# Patient Record
Sex: Male | Born: 1955
Health system: Southern US, Community
[De-identification: ages and names within clinical notes are randomized; demographics above are authoritative.]

## PROBLEM LIST (undated history)

## (undated) DIAGNOSIS — G8929 Other chronic pain: Secondary | ICD-10-CM

## (undated) DIAGNOSIS — F4310 Post-traumatic stress disorder, unspecified: Secondary | ICD-10-CM

## (undated) DIAGNOSIS — Z87442 Personal history of urinary calculi: Secondary | ICD-10-CM

## (undated) DIAGNOSIS — F319 Bipolar disorder, unspecified: Secondary | ICD-10-CM

## (undated) DIAGNOSIS — I10 Essential (primary) hypertension: Secondary | ICD-10-CM

## (undated) DIAGNOSIS — W3400XA Accidental discharge from unspecified firearms or gun, initial encounter: Secondary | ICD-10-CM

## (undated) DIAGNOSIS — F410 Panic disorder [episodic paroxysmal anxiety] without agoraphobia: Secondary | ICD-10-CM

## (undated) DIAGNOSIS — I82621 Acute embolism and thrombosis of deep veins of right upper extremity: Secondary | ICD-10-CM

## (undated) DIAGNOSIS — M549 Dorsalgia, unspecified: Secondary | ICD-10-CM

## (undated) DIAGNOSIS — J181 Lobar pneumonia, unspecified organism: Secondary | ICD-10-CM

## (undated) DIAGNOSIS — N319 Neuromuscular dysfunction of bladder, unspecified: Secondary | ICD-10-CM

## (undated) DIAGNOSIS — S060X9A Concussion with loss of consciousness of unspecified duration, initial encounter: Secondary | ICD-10-CM

## (undated) DIAGNOSIS — J449 Chronic obstructive pulmonary disease, unspecified: Secondary | ICD-10-CM

## (undated) DIAGNOSIS — I4819 Other persistent atrial fibrillation: Secondary | ICD-10-CM

## (undated) DIAGNOSIS — T879 Unspecified complications of amputation stump: Secondary | ICD-10-CM

## (undated) DIAGNOSIS — S42309A Unspecified fracture of shaft of humerus, unspecified arm, initial encounter for closed fracture: Secondary | ICD-10-CM

## (undated) DIAGNOSIS — J9621 Acute and chronic respiratory failure with hypoxia: Secondary | ICD-10-CM

## (undated) DIAGNOSIS — I5021 Acute systolic (congestive) heart failure: Secondary | ICD-10-CM

## (undated) DIAGNOSIS — R51 Headache: Secondary | ICD-10-CM

## (undated) DIAGNOSIS — F209 Schizophrenia, unspecified: Secondary | ICD-10-CM

## (undated) DIAGNOSIS — I219 Acute myocardial infarction, unspecified: Secondary | ICD-10-CM

## (undated) DIAGNOSIS — F329 Major depressive disorder, single episode, unspecified: Secondary | ICD-10-CM

## (undated) DIAGNOSIS — Z931 Gastrostomy status: Secondary | ICD-10-CM

## (undated) DIAGNOSIS — F429 Obsessive-compulsive disorder, unspecified: Secondary | ICD-10-CM

## (undated) DIAGNOSIS — S069X9A Unspecified intracranial injury with loss of consciousness of unspecified duration, initial encounter: Secondary | ICD-10-CM

## (undated) DIAGNOSIS — K029 Dental caries, unspecified: Secondary | ICD-10-CM

## (undated) DIAGNOSIS — F32A Depression, unspecified: Secondary | ICD-10-CM

## (undated) DIAGNOSIS — E876 Hypokalemia: Secondary | ICD-10-CM

## (undated) DIAGNOSIS — S060XAA Concussion with loss of consciousness status unknown, initial encounter: Secondary | ICD-10-CM

## (undated) DIAGNOSIS — F4312 Post-traumatic stress disorder, chronic: Secondary | ICD-10-CM

## (undated) DIAGNOSIS — K759 Inflammatory liver disease, unspecified: Secondary | ICD-10-CM

## (undated) DIAGNOSIS — E119 Type 2 diabetes mellitus without complications: Secondary | ICD-10-CM

## (undated) DIAGNOSIS — M1990 Unspecified osteoarthritis, unspecified site: Secondary | ICD-10-CM

## (undated) DIAGNOSIS — F1110 Opioid abuse, uncomplicated: Secondary | ICD-10-CM

## (undated) DIAGNOSIS — G4700 Insomnia, unspecified: Secondary | ICD-10-CM

## (undated) DIAGNOSIS — I482 Chronic atrial fibrillation, unspecified: Secondary | ICD-10-CM

## (undated) DIAGNOSIS — I330 Acute and subacute infective endocarditis: Secondary | ICD-10-CM

## (undated) DIAGNOSIS — G546 Phantom limb syndrome with pain: Secondary | ICD-10-CM

## (undated) DIAGNOSIS — I4891 Unspecified atrial fibrillation: Secondary | ICD-10-CM

## (undated) HISTORY — DX: Post-traumatic stress disorder, chronic: F43.12

## (undated) HISTORY — DX: Acute and subacute infective endocarditis: I33.0

## (undated) HISTORY — DX: Acute embolism and thrombosis of deep veins of right upper extremity: I82.621

## (undated) HISTORY — DX: Hypokalemia: E87.6

## (undated) HISTORY — PX: ABOVE KNEE LEG AMPUTATION: SUR20

## (undated) HISTORY — DX: Phantom limb syndrome with pain: G54.6

## (undated) HISTORY — PX: COLONOSCOPY WITH ESOPHAGOGASTRODUODENOSCOPY (EGD): SHX5779

## (undated) HISTORY — DX: Other persistent atrial fibrillation: I48.19

## (undated) HISTORY — DX: Type 2 diabetes mellitus without complications: E11.9

## (undated) HISTORY — PX: APPENDECTOMY: SHX54

## (undated) HISTORY — DX: Neuromuscular dysfunction of bladder, unspecified: N31.9

## (undated) HISTORY — DX: Gastrostomy status: Z93.1

---
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
## 2009-05-10 ENCOUNTER — Emergency Department (HOSPITAL_COMMUNITY): Admission: EM | Admit: 2009-05-10 | Discharge: 2009-05-10 | Payer: Self-pay | Admitting: Emergency Medicine

## 2013-11-24 ENCOUNTER — Non-Acute Institutional Stay (SKILLED_NURSING_FACILITY): Payer: Medicare Other | Admitting: Internal Medicine

## 2013-11-24 DIAGNOSIS — I471 Supraventricular tachycardia, unspecified: Secondary | ICD-10-CM

## 2013-11-24 DIAGNOSIS — I480 Paroxysmal atrial fibrillation: Secondary | ICD-10-CM

## 2013-11-24 DIAGNOSIS — A0472 Enterocolitis due to Clostridium difficile, not specified as recurrent: Secondary | ICD-10-CM | POA: Insufficient documentation

## 2013-11-24 DIAGNOSIS — Z720 Tobacco use: Secondary | ICD-10-CM

## 2013-11-24 DIAGNOSIS — S78119A Complete traumatic amputation at level between unspecified hip and knee, initial encounter: Secondary | ICD-10-CM

## 2013-11-24 DIAGNOSIS — Z89612 Acquired absence of left leg above knee: Principal | ICD-10-CM

## 2013-11-24 DIAGNOSIS — I4891 Unspecified atrial fibrillation: Secondary | ICD-10-CM

## 2013-11-24 DIAGNOSIS — F17200 Nicotine dependence, unspecified, uncomplicated: Secondary | ICD-10-CM

## 2013-11-24 DIAGNOSIS — G40909 Epilepsy, unspecified, not intractable, without status epilepticus: Secondary | ICD-10-CM

## 2013-11-24 DIAGNOSIS — Z89611 Acquired absence of right leg above knee: Secondary | ICD-10-CM

## 2013-11-24 HISTORY — DX: Acquired absence of right leg above knee: Z89.611

## 2013-11-24 HISTORY — DX: Supraventricular tachycardia, unspecified: I47.10

## 2013-11-24 HISTORY — DX: Epilepsy, unspecified, not intractable, without status epilepticus: G40.909

## 2013-11-24 HISTORY — DX: Supraventricular tachycardia: I47.1

## 2013-11-24 HISTORY — DX: Tobacco use: Z72.0

## 2013-11-24 NOTE — Progress Notes (Signed)
Patient ID: [name] [name], male   DOB: 25-Jun-1955, 58 y.o.   MRN: [id number]  Facility; [facility] Chief complaint; admission to SNF post admit to [facility] Exact dates uncertain  History; the patient came to this facility last week from [facility]. Not completely certain of the status admission. However he appears to undergone bilateral above-knee amputations. The patient states that this occurred as a result of frostbite he suffered in 2013 with resultant gangrene. He tells me that he has been through a series of foot surgeries bilaterally ultimately ending up in this amputation. I have some more information on his postoperative course which included pseudomembranous colitis, A. fib with rapid ventricular response with heart rate in the 190s, paroxysmal atrial tachycardia. Surreptitious narcotic in the hospital requiring Narcan. The patient spent some time in the ICU. On arrival here there was complains about inadequate dose of Xanax as well as narcotics .  Patient is actually transferring to a skilled facility in [place] apparently on Thursday due to the fact that this is a nonsmoking facility.  Past medical history Atrial fibrillation Blood loss anemia Secondary thrombocytosis Hyperglycemia but no formal diagnosis of diabetes Hyperglobulinemia Protein calorie malnutrition Status post below-knee amputation on the left 10/06/12 Hyponatremia Schizophrenia Hypomagnesemia Hepatitis C Tobacco use disorder Chronic pain disorder Status post left below-knee F. the patient 10/06/12 Flexion contractures of both knees Seizure disorder "Neuromuscular disorder" Innumerable orthopedic procedures on his lower extremities from 07/05/2011 through 03/13/2013  Medications; Aspirin 325 twice daily Calcium citrate 200 mg daily Vitamin D 3000 units daily NuIron 150 mg daily Toprolol 12.5 twice a day Xanax 1 mg 3 times a day although the patient came  here only on Xanax 1 mg each bedtime when necessary I increase this when he first arrived into the facility at his insistence. In Neurontin 603 times daily MSIR 15 mg every 6 hours when necessary Topamax 200 3 times daily Vancomycin orally 125 mg every 6 hours for 6 days  Social; she was in a skilled facility prior to his admission to [place] he states this was in [place]. I have none of this information. He continues to wish to smoke and this is a nonsmoking facility. I did discuss this with him in some detail emphasizing it was probably the right time to quit as he has not smoked in over several weeks. [name] [name] is insistent and moving in 2 days.  Family history; positive for diabetes in both sides of his family although the patient states he has not formally a diabetic  Review of systems Respiratory no cough no sputum Cardiac no chest pain no palpitations. He denies any prior history of cardiac disease before the atrial fibrillation during the latest hospitalization GI states his bowel movements are now solid there is no abdominal pain  Physical examination; General patient is not in any distress appears to be stable. Vitals pulse 72 and completely regular Respiratory; clear entry bilaterally Cardiac heart sounds sound regular no murmurs no gallops no carotid bruits Abdomen no tenderness no masses no liver no spleen. Extremities bilateral above-knee amputations. Sites look very stable sutures still in he is due to see his surgeon tomorrow.  Impression/plan #1 status post above-knee amputations. The sites appear to be stable #2 postoperative atrial fibrillation and paroxysmal atrial tachycardia. Once again his rate is controlled seems to be regular. At one point he appears to been on xarelto in the hospital however he has been discharged on aspirin 325 twice  a day and that only for 6 weeks #3 pseudomembranous colitis this appears to have resolved #4 seizure disorder I am assuming this is  the reason for the Neurontin and Topamax #5 personal history of schizophrenia although he is on nothing for this. Mental status seems to be stable. He does have some pressure of speech but all of it seemed to be fairly rational.  The patient is leaving the facility in 2 days. He appears to be stable. I have had personal discussion with him about tobacco cessation, however he is not willing to consider this. He states that he would consider using electronic cigarettes however there is apparently a facility policy against this. Would've liked to have checked his lab work however I am able to see from 7/13 he had a normal potassium at 4.3 BUN and creatinine were normal. His hemoglobin was 12.1 as he is going to a new facility I don't see the point in checking this. If this falls through then I will re review him next week

## 2015-02-21 ENCOUNTER — Encounter (HOSPITAL_COMMUNITY): Payer: Self-pay

## 2015-02-21 ENCOUNTER — Emergency Department (HOSPITAL_COMMUNITY): Payer: Medicare Other

## 2015-02-21 ENCOUNTER — Observation Stay (HOSPITAL_COMMUNITY)
Admission: EM | Admit: 2015-02-21 | Discharge: 2015-02-23 | Disposition: A | Discharge status: Home / Self Care | Payer: Medicare Other | Attending: Internal Medicine | Admitting: Internal Medicine

## 2015-02-21 DIAGNOSIS — G92 Toxic encephalopathy: Principal | ICD-10-CM | POA: Insufficient documentation

## 2015-02-21 DIAGNOSIS — F209 Schizophrenia, unspecified: Secondary | ICD-10-CM

## 2015-02-21 DIAGNOSIS — R9431 Abnormal electrocardiogram [ECG] [EKG]: Secondary | ICD-10-CM

## 2015-02-21 DIAGNOSIS — Z590 Homelessness: Secondary | ICD-10-CM | POA: Insufficient documentation

## 2015-02-21 DIAGNOSIS — Z89611 Acquired absence of right leg above knee: Secondary | ICD-10-CM | POA: Diagnosis not present

## 2015-02-21 DIAGNOSIS — I252 Old myocardial infarction: Secondary | ICD-10-CM | POA: Diagnosis not present

## 2015-02-21 DIAGNOSIS — I4581 Long QT syndrome: Secondary | ICD-10-CM | POA: Diagnosis not present

## 2015-02-21 DIAGNOSIS — F4323 Adjustment disorder with mixed anxiety and depressed mood: Secondary | ICD-10-CM | POA: Diagnosis not present

## 2015-02-21 DIAGNOSIS — Z89612 Acquired absence of left leg above knee: Secondary | ICD-10-CM | POA: Insufficient documentation

## 2015-02-21 DIAGNOSIS — J9600 Acute respiratory failure, unspecified whether with hypoxia or hypercapnia: Secondary | ICD-10-CM | POA: Diagnosis not present

## 2015-02-21 DIAGNOSIS — T40604A Poisoning by unspecified narcotics, undetermined, initial encounter: Secondary | ICD-10-CM

## 2015-02-21 DIAGNOSIS — Z720 Tobacco use: Secondary | ICD-10-CM | POA: Diagnosis present

## 2015-02-21 DIAGNOSIS — T402X5A Adverse effect of other opioids, initial encounter: Secondary | ICD-10-CM | POA: Diagnosis not present

## 2015-02-21 DIAGNOSIS — G894 Chronic pain syndrome: Secondary | ICD-10-CM | POA: Diagnosis not present

## 2015-02-21 DIAGNOSIS — T40601A Poisoning by unspecified narcotics, accidental (unintentional), initial encounter: Secondary | ICD-10-CM | POA: Insufficient documentation

## 2015-02-21 DIAGNOSIS — G8929 Other chronic pain: Secondary | ICD-10-CM | POA: Diagnosis not present

## 2015-02-21 DIAGNOSIS — I10 Essential (primary) hypertension: Secondary | ICD-10-CM | POA: Diagnosis not present

## 2015-02-21 DIAGNOSIS — T402X1A Poisoning by other opioids, accidental (unintentional), initial encounter: Secondary | ICD-10-CM

## 2015-02-21 HISTORY — DX: Other chronic pain: G89.29

## 2015-02-21 HISTORY — DX: Acute myocardial infarction, unspecified: I21.9

## 2015-02-21 HISTORY — DX: Essential (primary) hypertension: I10

## 2015-02-21 HISTORY — DX: Unspecified complications of amputation stump: T87.9

## 2015-02-21 HISTORY — DX: Obsessive-compulsive disorder, unspecified: F42.9

## 2015-02-21 HISTORY — DX: Schizophrenia, unspecified: F20.9

## 2015-02-21 LAB — I-STAT CG4 LACTIC ACID, ED: Lactic Acid, Venous: 0.81 mmol/L (ref 0.5–2.0)

## 2015-02-21 LAB — CBC WITH DIFFERENTIAL/PLATELET
BASOS ABS: 0.1 10*3/uL (ref 0.0–0.1)
BASOS PCT: 1 %
Eosinophils Absolute: 0.3 10*3/uL (ref 0.0–0.7)
Eosinophils Relative: 4 %
HEMATOCRIT: 40.2 % (ref 39.0–52.0)
Hemoglobin: 13.1 g/dL (ref 13.0–17.0)
LYMPHS PCT: 37 %
Lymphs Abs: 2.8 10*3/uL (ref 0.7–4.0)
MCH: 30.1 pg (ref 26.0–34.0)
MCHC: 32.6 g/dL (ref 30.0–36.0)
MCV: 92.4 fL (ref 78.0–100.0)
MONO ABS: 0.9 10*3/uL (ref 0.1–1.0)
Monocytes Relative: 12 %
NEUTROS ABS: 3.5 10*3/uL (ref 1.7–7.7)
NEUTROS PCT: 46 %
Platelets: 208 10*3/uL (ref 150–400)
RBC: 4.35 MIL/uL (ref 4.22–5.81)
RDW: 13.7 % (ref 11.5–15.5)
WBC: 7.6 10*3/uL (ref 4.0–10.5)

## 2015-02-21 LAB — BLOOD GAS, ARTERIAL
Acid-Base Excess: 0.8 mmol/L (ref 0.0–2.0)
Bicarbonate: 25 mEq/L — ABNORMAL HIGH (ref 20.0–24.0)
Drawn by: 307971
O2 Content: 2 L/min
O2 SAT: 94.8 %
PATIENT TEMPERATURE: 98.6
PH ART: 7.408 (ref 7.350–7.450)
PO2 ART: 74.7 mmHg — AB (ref 80.0–100.0)
TCO2: 22.1 mmol/L (ref 0–100)
pCO2 arterial: 40.3 mmHg (ref 35.0–45.0)

## 2015-02-21 LAB — MRSA PCR SCREENING: MRSA by PCR: NEGATIVE

## 2015-02-21 LAB — COMPREHENSIVE METABOLIC PANEL
ALBUMIN: 3.2 g/dL — AB (ref 3.5–5.0)
ALK PHOS: 96 U/L (ref 38–126)
ALT: 24 U/L (ref 17–63)
ANION GAP: 5 (ref 5–15)
AST: 33 U/L (ref 15–41)
BILIRUBIN TOTAL: 0.7 mg/dL (ref 0.3–1.2)
BUN: 15 mg/dL (ref 6–20)
CALCIUM: 8.8 mg/dL — AB (ref 8.9–10.3)
CO2: 27 mmol/L (ref 22–32)
Chloride: 110 mmol/L (ref 101–111)
Creatinine, Ser: 0.65 mg/dL (ref 0.61–1.24)
GFR calc Af Amer: >60 mL/min (ref >60)
GFR calc non Af Amer: >60 mL/min (ref >60)
GLUCOSE: 100 mg/dL — AB (ref 65–99)
POTASSIUM: 3.8 mmol/L (ref 3.5–5.1)
SODIUM: 142 mmol/L (ref 135–145)
Total Protein: 6.3 g/dL — ABNORMAL LOW (ref 6.5–8.1)

## 2015-02-21 LAB — SALICYLATE LEVEL

## 2015-02-21 LAB — RAPID URINE DRUG SCREEN, HOSP PERFORMED
Amphetamines: NOT DETECTED
BENZODIAZEPINES: POSITIVE — AB
Barbiturates: POSITIVE — AB
COCAINE: NOT DETECTED
OPIATES: POSITIVE — AB
TETRAHYDROCANNABINOL: NOT DETECTED

## 2015-02-21 LAB — ACETAMINOPHEN LEVEL: Acetaminophen (Tylenol), Serum: <10 ug/mL — ABNORMAL LOW (ref 10–30)

## 2015-02-21 LAB — LIPASE, BLOOD: LIPASE: 16 U/L — AB (ref 22–51)

## 2015-02-21 LAB — MAGNESIUM: MAGNESIUM: 1.9 mg/dL (ref 1.7–2.4)

## 2015-02-21 LAB — ETHANOL: Alcohol, Ethyl (B): <5 mg/dL (ref <5)

## 2015-02-21 LAB — BRAIN NATRIURETIC PEPTIDE: B Natriuretic Peptide: 66.9 pg/mL (ref 0.0–100.0)

## 2015-02-21 MED ORDER — KETOROLAC TROMETHAMINE 30 MG/ML IJ SOLN
30.0000 mg | Freq: Three times a day (TID) | INTRAMUSCULAR | Status: DC | PRN
  Administered 2015-02-22 (×2): 30 mg via INTRAVENOUS
  Filled 2015-02-21 (×2): qty 1

## 2015-02-21 MED ORDER — SODIUM CHLORIDE 0.9 % IJ SOLN: 3.0000 mL | Freq: Two times a day (BID) | INTRAMUSCULAR | Status: DC

## 2015-02-21 MED ORDER — NALOXONE HCL 0.4 MG/ML IJ SOLN
0.4000 mg | Freq: Once | INTRAMUSCULAR | Status: AC
  Administered 2015-02-21: 0.4 mg via INTRAVENOUS
  Filled 2015-02-21: qty 1

## 2015-02-21 MED ORDER — ALBUTEROL SULFATE (2.5 MG/3ML) 0.083% IN NEBU
5.0000 mg | INHALATION_SOLUTION | Freq: Once | RESPIRATORY_TRACT | Status: AC
  Administered 2015-02-21: 5 mg via RESPIRATORY_TRACT
  Filled 2015-02-21: qty 6

## 2015-02-21 MED ORDER — SODIUM CHLORIDE 0.9 % IV SOLN
0.2500 mg/h | INTRAVENOUS | Status: DC
  Administered 2015-02-21: 0.25 mg/h via INTRAVENOUS
  Filled 2015-02-21: qty 4

## 2015-02-21 MED ORDER — ACETAMINOPHEN 650 MG RE SUPP: 650.0000 mg | Freq: Four times a day (QID) | RECTAL | Status: DC | PRN

## 2015-02-21 MED ORDER — ONDANSETRON HCL 4 MG PO TABS: 4.0000 mg | ORAL_TABLET | Freq: Four times a day (QID) | ORAL | Status: DC | PRN

## 2015-02-21 MED ORDER — NICOTINE 14 MG/24HR TD PT24
14.0000 mg | MEDICATED_PATCH | Freq: Every day | TRANSDERMAL | Status: DC
  Administered 2015-02-23: 14 mg via TRANSDERMAL
  Filled 2015-02-21 (×3): qty 1

## 2015-02-21 MED ORDER — ENOXAPARIN SODIUM 40 MG/0.4ML ~~LOC~~ SOLN
40.0000 mg | SUBCUTANEOUS | Status: DC
  Administered 2015-02-21 – 2015-02-22 (×2): 40 mg via SUBCUTANEOUS
  Filled 2015-02-21 (×3): qty 0.4

## 2015-02-21 MED ORDER — ONDANSETRON HCL 4 MG/2ML IJ SOLN: 4.0000 mg | Freq: Four times a day (QID) | INTRAMUSCULAR | Status: DC | PRN

## 2015-02-21 MED ORDER — SODIUM CHLORIDE 0.9 % IV BOLUS (SEPSIS)
500.0000 mL | Freq: Once | INTRAVENOUS | Status: AC
  Administered 2015-02-21: 500 mL via INTRAVENOUS

## 2015-02-21 MED ORDER — ACETAMINOPHEN 325 MG PO TABS: 650.0000 mg | ORAL_TABLET | Freq: Four times a day (QID) | ORAL | Status: DC | PRN

## 2015-02-21 MED ORDER — SODIUM CHLORIDE 0.9 % IV SOLN
INTRAVENOUS | Status: DC
  Administered 2015-02-21: 20:00:00 via INTRAVENOUS

## 2015-02-21 NOTE — Progress Notes (Addendum)
Pt unable to provide name of pcp States he is transitioning to another provider Pt address listed in EPIC as [address] [place] Pt not able to stay awake long enough for CM to speak with him EMS brought pt w/c with blue, white and tan checkered bag on back of it

## 2015-02-21 NOTE — ED Provider Notes (Signed)
CSN: [id number]     Arrival date & time 02/21/15  1418 History   First MD Initiated Contact with Patient 02/21/15 1455     Chief Complaint  Patient presents with  . Drug Overdose     (Consider location/radiation/quality/duration/timing/severity/associated sxs/prior Treatment) HPI  59 year old male with a history of schizophrenia and bilateral AKA with chronic pain presents from his psychiatrist's office with concern for an opiate overdose. Patient was lethargic, breathing around 4 times per minute at the office. Woke up appropriately with 0.4 mg narcan. Shortly after arrival but before I evaluated patient he became lethargic again and respiratory rate decreased. Given another 0.4 mg narcan and improved. Patient states he's been tired for weeks and this is a recurrent issue. Does not know why he is having trouble sleeping. Takes Morphine and oxycodone. Denies any extra doses. Denies any other medication use. No SI/HI. No intentional overdose. Complains of a cough for "weeks" without dyspnea or chest pain.   Past Medical History  Diagnosis Date  . OCD (obsessive compulsive disorder)   . Schizophrenia (HCC)   . MI (myocardial infarction) (HCC)   . HTN (hypertension)   . AKA stump complication (HCC)   . Chronic pain    Past Surgical History  Procedure Laterality Date  . Above knee leg amputation Bilateral    History reviewed. No pertinent family history. Social History  Substance Use Topics  . Smoking status: Current Every Day Smoker -- 0.50 packs/day    Types: Cigarettes  . Smokeless tobacco: Never Used  . Alcohol Use: No    Review of Systems  Constitutional: Positive for fatigue. Negative for fever.  Respiratory: Positive for cough. Negative for shortness of breath.   Cardiovascular: Negative for chest pain.  Gastrointestinal: Negative for vomiting.  Psychiatric/Behavioral: Positive for sleep disturbance. Negative for suicidal ideas.  All other systems reviewed and are  negative.     Allergies  Review of patient's allergies indicates no known allergies.  Home Medications   Prior to Admission medications   Not on File   BP 106/67 mmHg  Pulse 57  Temp(Src) 98.8 F (37.1 C) (Oral)  Resp 10  SpO2 89% Physical Exam  Constitutional: He is oriented to person, place, and time. He appears well-developed and well-nourished.  HENT:  Head: Normocephalic and atraumatic.  Right Ear: External ear normal.  Left Ear: External ear normal.  Nose: Nose normal.  Eyes: EOM are normal. Pupils are equal, round, and reactive to light. Right eye exhibits no discharge. Left eye exhibits no discharge.  Neck: Neck supple.  Cardiovascular: Normal rate, regular rhythm, normal heart sounds and intact distal pulses.   Pulmonary/Chest: Effort normal. He has wheezes (faint bilateral wheezes).  Abdominal: Soft. He exhibits no distension. There is no tenderness.  Musculoskeletal: He exhibits no edema.  Bilateral AKA  Neurological: He is alert and oriented to person, place, and time.  Alert and oriented appropriately. Normal strength in all 4 extremities  Skin: Skin is warm and dry.  Nursing note and vitals reviewed.   ED Course  Procedures (including critical care time) Labs Review Labs Reviewed  ACETAMINOPHEN LEVEL - Abnormal; Notable for the following:    Acetaminophen (Tylenol), Serum <10 (*)    All other components within normal limits  COMPREHENSIVE METABOLIC PANEL - Abnormal; Notable for the following:    Glucose, Bld 100 (*)    Calcium 8.8 (*)    Total Protein 6.3 (*)    Albumin 3.2 (*)    All other components  within normal limits  LIPASE, BLOOD - Abnormal; Notable for the following:    Lipase 16 (*)    All other components within normal limits  URINE RAPID DRUG SCREEN, HOSP PERFORMED - Abnormal; Notable for the following:    Opiates POSITIVE (*)    Benzodiazepines POSITIVE (*)    Barbiturates POSITIVE (*)    All other components within normal limits   BLOOD GAS, ARTERIAL - Abnormal; Notable for the following:    pO2, Arterial 74.7 (*)    Bicarbonate 25.0 (*)    All other components within normal limits  MRSA PCR SCREENING  ETHANOL  SALICYLATE LEVEL  CBC WITH DIFFERENTIAL/PLATELET  BRAIN NATRIURETIC PEPTIDE  MAGNESIUM  I-STAT CG4 LACTIC ACID, ED    Imaging Review Dg Chest Port 1 View  02/21/2015  CLINICAL DATA:  Cough lethargy EXAM: PORTABLE CHEST 1 VIEW COMPARISON:  None currently available FINDINGS: Low lung volumes with diffuse interstitial coarsening. Borderline cardiomegaly. Negative aortic and hilar contours. There is a bandlike opacity in the right mid lung favoring atelectasis. IMPRESSION: 1. Diffuse interstitial coarsening which could be bronchitic or congestive. 2. Atelectasis along the right minor fissure. Electronically Signed   By: Marnee Spring M.D.   On: 02/21/2015 15:55   I have personally reviewed and evaluated these images and lab results as part of my medical decision-making.   EKG Interpretation   Date/Time:  Monday February 21 2015 14:38:20 EDT Ventricular Rate:  71 PR Interval:  147 QRS Duration: 86 QT Interval:  461 QTC Calculation: 501 R Axis:   79 Text Interpretation:  Sinus rhythm Prolonged QT interval prolonged QT, no  previous available for comparison Confirmed by LITTLE MD, RACHEL [phone])  on 02/21/2015 2:53:35 PM      MDM   Final diagnoses:  Narcotic overdose, undetermined intent, initial encounter    Patient awakens easily to stimulation but keeps falling asleep and having decreased respirations under 10 respirations per minute. Has been given 2 doses of Narcan already. Will need to be placed on a Narcan drip and need to be observed in the hospital given his long-acting morphine is likely contributing. No other focal findings. Admit to the hospitalist in the step down unit.     Pricilla Loveless, MD 02/21/15 [phone]

## 2015-02-21 NOTE — H&P (Signed)
[facility] History and Physical  [name] [name] WUJ:[id number] DOB: [birth year] DOA: 02/21/2015  Referring physician: Dr Merril Abbe PCP: No primary care provider on file.   Chief Complaint: slow breathing  HPI: [name] [name] is a 59 y.o. male  Level V caveat: Patient presenting an altered mental state and unable to give reliable history. History obtained by ED report with limited additional information by patient.  Patient presented to his psychiatrist's office today for routine follow-up when he was noted to have a respiratory rate of 4 breaths per minute and acting very lethargic and sleepy. Patient will wake up briefly when stabilized by staff but would very quickly returned to his somnolent state. In the ED patient was given 0.4 mg of Narcan and awoke very rapidly, however this only lasted a few minutes and patient needed an additional dose of Narcan nor to get his respiratory rate back up to a normal state.  Currently patient denies taking any additional doses of his morphine and oxycodone outside of that which is prescribed. See separate no recent changes to his medications. Denies any other symptoms such as chest pain, shortness of breath, fevers. Continues to state "I'm just trying to get my life right."  Review of Systems:  Unable to obtain reliable review of history due to pts mental state  Past Medical History  Diagnosis Date  . OCD (obsessive compulsive disorder)   . Schizophrenia (HCC)   . MI (myocardial infarction) (HCC)   . HTN (hypertension)   . AKA stump complication (HCC)   . Chronic pain    Past Surgical History  Procedure Laterality Date  . Above knee leg amputation Bilateral    Social History:  reports that he has been smoking Cigarettes.  He has been smoking about 0.50 packs per day. He has never used smokeless tobacco. He reports that he does not drink alcohol or use illicit drugs.  No Known Allergies  Family History  Problem Relation Age of  Onset  . Hypertension Mother   . Hypertension Father   . Diabetes Mother   . Diabetes Father      Prior to Admission medications   Medication Sig Start Date End Date Taking? Authorizing Provider  ALPRAZolam Prudy Feeler) 1 MG tablet Take 1 mg by mouth 4 (four) times daily as needed for anxiety.   Yes Historical Provider, MD  morphine (MSIR) 30 MG tablet Take 30 mg by mouth 2 (two) times daily.   Yes Historical Provider, MD  oxycodone (ROXICODONE) 30 MG immediate release tablet Take 30 mg by mouth 2 (two) times daily as needed (for breakthrough pain).    Yes Historical Provider, MD   Physical Exam: Filed Vitals:   02/21/15 1730 02/21/15 1808 02/21/15 1815 02/21/15 1830  BP: 105/88 98/71 114/84 109/72  Pulse: 72 64 55 64  Temp:      TempSrc:      Resp: SpO2: 95% 91% 92% 97%    Wt Readings from Last 3 Encounters:  No data found for Wt    General: Somnolent. Arousable Eyes: Conjunctival injection, EOMI, pupils sluggish but reactive and symmetric ENT: Dry mucous membranes Neck:  no LAD, masses or thyromegaly Cardiovascular:  RRR, no m/r/g.  Respiratory: Patient with witnessed apneic episodes up to approximately 16-20 seconds. Respiratory rate approximately 4-5 breaths per minute. Unlabored. Clear to auscultation bilaterally. Abdomen:  soft, ntnd Skin:  no rash or induration seen on limited exam Musculoskeletal: Bilateral AKA. Upper extremity is normal.  Psychiatric:  Patient unable to answer questions appropriately when asked about his symptoms or medication dosing. Patient very tangential in his thought process. Patient denies SI/HI. Patient continues to state he is trying to get his life right. Neurologic:  CN 2-12 grossly intact, moves all extremities in coordinated fashion.          Labs on Admission:  Basic Metabolic Panel:  Recent Labs Lab 02/21/15 1529  NA 142  K 3.8  CL 110  CO2 27  GLUCOSE 100*  BUN 15  CREATININE 0.65  CALCIUM 8.8*   Liver Function  Tests:  Recent Labs Lab 02/21/15 1529  AST 33  ALT 24  ALKPHOS 96  BILITOT 0.7  PROT 6.3*  ALBUMIN 3.2*    Recent Labs Lab 02/21/15 1529  LIPASE 16*   No results for input(s): AMMONIA in the last 168 hours. CBC:  Recent Labs Lab 02/21/15 1529  WBC 7.6  NEUTROABS 3.5  HGB 13.1  HCT 40.2  MCV 92.4  PLT 208   Cardiac Enzymes: No results for input(s): CKTOTAL, CKMB, CKMBINDEX, TROPONINI in the last 168 hours.  BNP (last 3 results)  Recent Labs  02/21/15 1530  BNP 66.9    ProBNP (last 3 results) No results for input(s): PROBNP in the last 8760 hours.  CBG: No results for input(s): GLUCAP in the last 168 hours.  Radiological Exams on Admission: Dg Chest Port 1 View  02/21/2015  CLINICAL DATA:  Cough lethargy EXAM: PORTABLE CHEST 1 VIEW COMPARISON:  None currently available FINDINGS: Low lung volumes with diffuse interstitial coarsening. Borderline cardiomegaly. Negative aortic and hilar contours. There is a bandlike opacity in the right mid lung favoring atelectasis. IMPRESSION: 1. Diffuse interstitial coarsening which could be bronchitic or congestive. 2. Atelectasis along the right minor fissure. Electronically Signed   By: Marnee Spring M.D.   On: 02/21/2015 15:55    Assessment/Plan Active Problems:   Tobacco abuse   Narcotic overdose   Acute respiratory failure (HCC)   Opioid overdose   Schizophrenia (HCC)   Chronic pain   Prolonged Q-T interval on ECG   Acute respiratory failure: Likely secondary to opioid overdose. No evidence of infectious etiology. Lactic acid 0.81, WBC 7.6, afebrile, hemodynamically stable, BNP 66.9. CXR without acute process noted. - Stepdown - Narcan drip - ABG - O2 - f/u UA (bladder scan)  Opioid overdose: Patient currently denies intentional overdose and states there is no change in his daily regimen of MS Contin 30 mg twice a day and oxycodone 30 mg twice a day. Patient was seen at his psychiatrist's office just prior  to arrival in the ED where he was noted to be very somnolent. Salicylate level normal, Tylenol level normal, alcohol level normal. - Bedside sitter - Psychiatry consult - UDS  Psych: Admission for opiate overdose as above. Patient with baseline history of schizophrenia and OCD. In ED states he just really sleepy because he hasn't slept for the past 2-3 days.  - Psych consult as above  Prolonged QT: No previous EKG to compare. No known psychotropic drugs or other medications (Rx or OTC) that could cause long QT. Electrolytes nml - Tele - Mag level  Chronic pain: Patient status post bilateral AKA with long history of narcotic use for chronic MSK pain. - Hold home morphine and oxycodone due to problem #1 and 2. - toradol IV PRN pain relief.   Tobacco use:1/2ppd - Nicotine patch   Code Status: FULL - assumed  DVT Prophylaxis: Lovenox Family Communication: none  Disposition Plan:  Pending Improvement    MERRELL, DAVID Shela Commons, MD Family Medicine [facility] [email or web address] Password [email or web address]

## 2015-02-21 NOTE — ED Notes (Signed)
Per EMS- Patient was at his counselor's office and was lethargic and falling asleep while talking. Patient was given Narcan 0.4 mg IV prior to arrival tot he ED. Patient did awaken and was verbally abusive to EMS-

## 2015-02-21 NOTE — ED Notes (Signed)
Bed: IE33 Expected date:  Expected time:  Means of arrival:  Comments: EMS- 59yo M, lethargic/opiate OD

## 2015-02-22 DIAGNOSIS — T40604D Poisoning by unspecified narcotics, undetermined, subsequent encounter: Secondary | ICD-10-CM | POA: Diagnosis not present

## 2015-02-22 DIAGNOSIS — J9600 Acute respiratory failure, unspecified whether with hypoxia or hypercapnia: Secondary | ICD-10-CM | POA: Diagnosis not present

## 2015-02-22 DIAGNOSIS — T40602A Poisoning by unspecified narcotics, intentional self-harm, initial encounter: Secondary | ICD-10-CM | POA: Diagnosis not present

## 2015-02-22 DIAGNOSIS — T1491 Suicide attempt: Secondary | ICD-10-CM | POA: Diagnosis not present

## 2015-02-22 DIAGNOSIS — F4323 Adjustment disorder with mixed anxiety and depressed mood: Secondary | ICD-10-CM

## 2015-02-22 DIAGNOSIS — G92 Toxic encephalopathy: Secondary | ICD-10-CM | POA: Diagnosis not present

## 2015-02-22 HISTORY — DX: Adjustment disorder with mixed anxiety and depressed mood: F43.23

## 2015-02-22 MED ORDER — HYDROXYZINE HCL 50 MG PO TABS
50.0000 mg | ORAL_TABLET | Freq: Every day | ORAL | Status: DC
  Administered 2015-02-22: 50 mg via ORAL
  Filled 2015-02-22 (×2): qty 1

## 2015-02-22 MED ORDER — ALPRAZOLAM 1 MG PO TABS
1.0000 mg | ORAL_TABLET | Freq: Three times a day (TID) | ORAL | Status: DC | PRN
  Administered 2015-02-22 – 2015-02-23 (×2): 1 mg via ORAL
  Filled 2015-02-22 (×2): qty 1

## 2015-02-22 MED ORDER — MIRTAZAPINE 15 MG PO TBDP
15.0000 mg | ORAL_TABLET | Freq: Every day | ORAL | Status: DC
  Administered 2015-02-22: 15 mg via ORAL
  Filled 2015-02-22 (×3): qty 1

## 2015-02-22 MED ORDER — MORPHINE SULFATE ER 30 MG PO TBCR
30.0000 mg | EXTENDED_RELEASE_TABLET | Freq: Two times a day (BID) | ORAL | Status: DC
  Administered 2015-02-22 – 2015-02-23 (×2): 30 mg via ORAL
  Filled 2015-02-22 (×2): qty 1

## 2015-02-22 NOTE — Consult Note (Signed)
[name] [name] Psychiatry Consult   Reason for Consult:  drug overdose Referring Physician:  Dr. Karleen Hampshire Patient Identification: [name] [name] MRN:  [id number] Principal Diagnosis: Narcotic overdose Diagnosis:   Patient Active Problem List   Diagnosis Date Noted  . Narcotic overdose [T40.601A] 02/21/2015  . Acute respiratory failure ([place]) [J96.00] 02/21/2015  . Opioid overdose [T40.2X1A] 02/21/2015  . Schizophrenia ([place]) [F20.9] 02/21/2015  . Chronic pain [G89.29] 02/21/2015  . Prolonged Q-T interval on ECG [I45.81] 02/21/2015  . S/P bilateral above knee amputation ([place]) [T01.601, Z89.612] 11/24/2013  . Atrial fibrillation ([place]) [I48.91] 11/24/2013  . Paroxysmal supraventricular tachycardia ([place]) [I47.1] 11/24/2013  . Pseudomembranous colitis [A04.7] 11/24/2013  . Seizure disorder ([place]) [U93.235] 11/24/2013  . Tobacco abuse [Z72.0] 11/24/2013    Total Time spent with patient: 1 hour  Subjective:   [name] [name] is a 59 y.o. male patient admitted with somatic depression and insomnia.  HPI:  [name] 59 year old male seen face-to-face for psychiatric consultation and evaluation of overdose of opiates. Patient is living in a motel, and has been taking his medication from a psychiatrist and seeing a counselor in [place]. Reportedly patient was at counselor's office with the opiate overdose, drowsy and sleepy not able to respond with the counselor which is a concern he was sent to the hospital. Patient was admitted to psychiatric hospital when he was younger 59 years old but denies current suicidal or homicidal ideation. Patient repeatedly denied intentional overdose of opiates. Patient reported he is currently waiting for the placement. Patient has bilateral AKA and with a history of schizophrenia.  Patient has been awake, alert, oriented to time place person but has a poor memory about his medications and provides. Patient consider himself is a homeless and looking for  placement with the help of family members but could not give any details about any family members.  Patient denies current suicidal/homicidal ideation, intention or plans. Patient does not appear to be responding to internal stimuli. Patient stated that he has artificial limbs which was kept in his brother's home. Patient could not give me details about his brother for collateral information.  Past Psychiatric History: Patient has been diagnosed with schizophrenia and opioid abuse versus dependence and has received outpatient medication management from [facility].  Risk to Self: Is patient at risk for suicide?: No Risk to Others:   Prior Inpatient Therapy:   Prior Outpatient Therapy:    Past Medical History:  Past Medical History  Diagnosis Date  . OCD (obsessive compulsive disorder)   . Schizophrenia ([place])   . MI (myocardial infarction) ([place])   . HTN (hypertension)   . AKA stump complication ([place])   . Chronic pain     Past Surgical History  Procedure Laterality Date  . Above knee leg amputation Bilateral    Family History:  Family History  Problem Relation Age of Onset  . Hypertension Mother   . Hypertension Father   . Diabetes Mother   . Diabetes Father    Family Psychiatric  History: Unknown Social History:  History  Alcohol Use No     History  Drug Use No    Social History   Social History  . Marital Status: Single    Spouse Name: N/A  . Number of Children: N/A  . Years of Education: N/A   Social History Main Topics  . Smoking status: Current Every Day Smoker -- 0.50 packs/day    Types: Cigarettes  . Smokeless tobacco: Never Used  . Alcohol Use: No  .  Drug Use: No  . Sexual Activity: Not Asked   Other Topics Concern  . None   Social History Narrative   Additional Social History: Patient consider himself is a homeless, disabled and a poor historian.                          Allergies:  No Known Allergies  Labs:  Results for orders placed  or performed during the hospital encounter of 02/21/15 (from the past 48 hour(s))  Acetaminophen level     Status: Abnormal   Collection Time: 02/21/15  3:29 PM  Result Value Ref Range   Acetaminophen (Tylenol), Serum <10 (L) 10 - 30 ug/mL    Comment:        THERAPEUTIC CONCENTRATIONS VARY SIGNIFICANTLY. A RANGE OF 10-30 ug/mL MAY BE AN EFFECTIVE CONCENTRATION FOR MANY PATIENTS. HOWEVER, SOME ARE BEST TREATED AT CONCENTRATIONS OUTSIDE THIS RANGE. ACETAMINOPHEN CONCENTRATIONS >150 ug/mL AT 4 HOURS AFTER INGESTION AND >50 ug/mL AT 12 HOURS AFTER INGESTION ARE OFTEN ASSOCIATED WITH TOXIC REACTIONS.   Comprehensive metabolic panel     Status: Abnormal   Collection Time: 02/21/15  3:29 PM  Result Value Ref Range   Sodium 142 135 - 145 mmol/L   Potassium 3.8 3.5 - 5.1 mmol/L   Chloride 110 101 - 111 mmol/L   CO2 27 22 - 32 mmol/L   Glucose, Bld 100 (H) 65 - 99 mg/dL   BUN 15 6 - 20 mg/dL   Creatinine, Ser 0.65 0.61 - 1.24 mg/dL   Calcium 8.8 (L) 8.9 - 10.3 mg/dL   Total Protein 6.3 (L) 6.5 - 8.1 g/dL   Albumin 3.2 (L) 3.5 - 5.0 g/dL   AST 33 15 - 41 U/L   ALT 24 17 - 63 U/L   Alkaline Phosphatase 96 38 - 126 U/L   Total Bilirubin 0.7 0.3 - 1.2 mg/dL   GFR calc non Af Amer >60 >60 mL/min   GFR calc Af Amer >60 >60 mL/min    Comment: (NOTE) The eGFR has been calculated using the CKD EPI equation. This calculation has not been validated in all clinical situations. eGFR's persistently <60 mL/min signify possible Chronic Kidney Disease.    Anion gap 5 5 - 15  Ethanol     Status: None   Collection Time: 02/21/15  3:29 PM  Result Value Ref Range   Alcohol, Ethyl (B) <5 <5 mg/dL    Comment:        LOWEST DETECTABLE LIMIT FOR SERUM ALCOHOL IS 5 mg/dL FOR MEDICAL PURPOSES ONLY   Lipase, blood     Status: Abnormal   Collection Time: 02/21/15  3:29 PM  Result Value Ref Range   Lipase 16 (L) 22 - 51 U/L  Salicylate level     Status: None   Collection Time: 02/21/15  3:29 PM   Result Value Ref Range   Salicylate Lvl <9.0 2.8 - 30.0 mg/dL  CBC with Differential     Status: None   Collection Time: 02/21/15  3:29 PM  Result Value Ref Range   WBC 7.6 4.0 - 10.5 K/uL   RBC 4.35 4.22 - 5.81 MIL/uL   Hemoglobin 13.1 13.0 - 17.0 g/dL   HCT 40.2 39.0 - 52.0 %   MCV 92.4 78.0 - 100.0 fL   MCH 30.1 26.0 - 34.0 pg   MCHC 32.6 30.0 - 36.0 g/dL   RDW 13.7 11.5 - 15.5 %   Platelets 208 150 -  400 K/uL   Neutrophils Relative % 46 %   Neutro Abs 3.5 1.7 - 7.7 K/uL   Lymphocytes Relative 37 %   Lymphs Abs 2.8 0.7 - 4.0 K/uL   Monocytes Relative 12 %   Monocytes Absolute 0.9 0.1 - 1.0 K/uL   Eosinophils Relative 4 %   Eosinophils Absolute 0.3 0.0 - 0.7 K/uL   Basophils Relative 1 %   Basophils Absolute 0.1 0.0 - 0.1 K/uL  Magnesium     Status: None   Collection Time: 02/21/15  3:29 PM  Result Value Ref Range   Magnesium 1.9 1.7 - 2.4 mg/dL  Brain natriuretic peptide     Status: None   Collection Time: 02/21/15  3:30 PM  Result Value Ref Range   B Natriuretic Peptide 66.9 0.0 - 100.0 pg/mL  I-Stat CG4 Lactic Acid, ED     Status: None   Collection Time: 02/21/15  3:39 PM  Result Value Ref Range   Lactic Acid, Venous 0.81 0.5 - 2.0 mmol/L  Blood gas, arterial     Status: Abnormal   Collection Time: 02/21/15  6:45 PM  Result Value Ref Range   O2 Content 2.0 L/min   Delivery systems NASAL CANNULA    pH, Arterial 7.408 7.350 - 7.450   pCO2 arterial 40.3 35.0 - 45.0 mmHg   pO2, Arterial 74.7 (L) 80.0 - 100.0 mmHg   Bicarbonate 25.0 (H) 20.0 - 24.0 mEq/L   TCO2 22.1 0 - 100 mmol/L   Acid-Base Excess 0.8 0.0 - 2.0 mmol/L   O2 Saturation 94.8 %   Patient temperature 98.6    Collection site BRACHIAL ARTERY    Drawn by [id number]    Sample type ARTERIAL DRAW   MRSA PCR Screening     Status: None   Collection Time: 02/21/15  8:26 PM  Result Value Ref Range   MRSA by PCR NEGATIVE NEGATIVE    Comment:        The GeneXpert MRSA Assay (FDA approved for NASAL  specimens only), is one component of a comprehensive MRSA colonization surveillance program. It is not intended to diagnose MRSA infection nor to guide or monitor treatment for MRSA infections.   Urine rapid drug screen (hosp performed)     Status: Abnormal   Collection Time: 02/21/15 10:43 PM  Result Value Ref Range   Opiates POSITIVE (A) NONE DETECTED   Cocaine NONE DETECTED NONE DETECTED   Benzodiazepines POSITIVE (A) NONE DETECTED   Amphetamines NONE DETECTED NONE DETECTED   Tetrahydrocannabinol NONE DETECTED NONE DETECTED   Barbiturates POSITIVE (A) NONE DETECTED    Comment:        DRUG SCREEN FOR MEDICAL PURPOSES ONLY.  IF CONFIRMATION IS NEEDED FOR ANY PURPOSE, NOTIFY LAB WITHIN 5 DAYS.        LOWEST DETECTABLE LIMITS FOR URINE DRUG SCREEN Drug Class       Cutoff (ng/mL) Amphetamine      1000 Barbiturate      200 Benzodiazepine   786 Tricyclics       754 Opiates          300 Cocaine          300 THC              50     Current Facility-Administered Medications  Medication Dose Route Frequency Provider Last Rate Last Dose  . 0.9 %  sodium chloride infusion   Intravenous Continuous Waldemar Dickens, MD   Stopped at 02/22/15 [phone]  .  acetaminophen (TYLENOL) tablet 650 mg  650 mg Oral Q6H PRN Waldemar Dickens, MD       Or  . acetaminophen (TYLENOL) suppository 650 mg  650 mg Rectal Q6H PRN Waldemar Dickens, MD      . enoxaparin (LOVENOX) injection 40 mg  40 mg Subcutaneous Q24H Waldemar Dickens, MD   40 mg at 02/21/15 2107  . ketorolac (TORADOL) 30 MG/ML injection 30 mg  30 mg Intravenous Q8H PRN Waldemar Dickens, MD      . naloxone [facility]) 4 mg in sodium chloride 0.9 % 250 mL infusion  0.25 mg/hr Intravenous Continuous Waldemar Dickens, MD   Stopped at 02/22/15 [phone]  . nicotine (NICODERM CQ - dosed in mg/24 hours) patch 14 mg  14 mg Transdermal Daily Waldemar Dickens, MD   14 mg at 02/21/15 1915  . ondansetron (ZOFRAN) tablet 4 mg  4 mg Oral Q6H PRN Waldemar Dickens, MD        Or  . ondansetron [facility]) injection 4 mg  4 mg Intravenous Q6H PRN Waldemar Dickens, MD      . sodium chloride 0.9 % injection 3 mL  3 mL Intravenous Q12H Waldemar Dickens, MD   3 mL at 02/21/15 2200    Musculoskeletal: Strength & Muscle Tone: within normal limits Gait & Station: Bilateral AKA Patient leans: N/A  Psychiatric Specialty Exam: ROS patient denies nausea or vomiting, abdominal pain, shortness of breath and chest pain No Fever-chills, No Headache, No changes with Vision or hearing, reports vertigo No problems swallowing food or Liquids, No Chest pain, Cough or Shortness of Breath, No Abdominal pain, No Nausea or Vommitting, Bowel movements are regular, No Blood in stool or Urine, No dysuria, No new skin rashes or bruises, No new joints pains-aches,  No new weakness, tingling, numbness in any extremity, No recent weight gain or loss, No polyuria, polydypsia or polyphagia,  A full 10 point Review of Systems was done, except as stated above, all other Review of Systems were negative.  Blood pressure 132/87, pulse 62, temperature 98.6 F (37 C), temperature source Oral, resp. rate 18, height 6' 1"  (1.854 m), weight 80.4 kg (177 lb 4 oz), SpO2 92 %.Body mass index is 23.39 kg/(m^2).  General Appearance: Bizarre and Guarded  Eye Contact::  Good  Speech:  Pressured  Volume:  Increased  Mood:  Euphoric  Affect:  Congruent and Labile  Thought Process:  Irrelevant and Tangential  Orientation:  Full (Time, Place, and Person)  Thought Content:  WDL  Suicidal Thoughts:  No  Homicidal Thoughts:  No  Memory:  Immediate;   Fair Recent;   Fair  Judgement:  Fair  Insight:  Fair  Psychomotor Activity:  Restlessness  Concentration:  Fair  Recall:  [employer] of Knowledge:Good  Language: Good  Akathisia:  Negative  Handed:  Right  AIMS (if indicated):     Assets:  Communication Skills Desire for Improvement Financial Resources/Insurance Leisure Time Resilience Social  Support  ADL's:  Impaired  Cognition: Impaired,  Mild  Sleep:      Treatment Plan Summary: Daily contact with patient to assess and evaluate symptoms and progress in treatment and Medication management  Patient was not able to provide information about psychiatric medications at this time except he takes alprazolam and another medication. Patient denied current suicidal ideation contract for safety Will ask psychiatric social service to obtain collateral history from his primary care physician's and psychiatric practice in [place] May contact patient Brother who has been resourceful and helpful to the patient  Disposition: Patient does not meet criteria for psychiatric inpatient admission. Supportive therapy provided about ongoing stressors.  Kyrin Gratz,JANARDHAHA R. 02/22/2015 11:23 AM

## 2015-02-22 NOTE — Progress Notes (Signed)
Pt is admitted to 5E18 from [facility] ICU. Admission vital is stable. Pt is AOX4

## 2015-02-22 NOTE — Progress Notes (Signed)
[facility] PROGRESS NOTE  [name] [name] ZOX:[id number] DOB: [birth year] DOA: 02/21/2015 PCP: No primary care provider on file. Brief history: 59 year old male admitted for altered mental status , acute respiratory failure and lethargy.   Assessment/Plan: Acute encephalopathy: Possibly from opiod overdose, as per the patient unintentional. He responded well to narcan and he is alert and oriented.  Resolved.    Acute respiratory failure:  Possibly secondary to the above. And is resolved.  He is on RA with good oxygen sats.    Opioid Overdose: Did not restart his oxy 30 mg bid, . Psychiatry consulted too.   Chronic pain syndrome: Pain control    Code Status: full code.  Family Communication: none at bedside.  Disposition Plan: pending PT eval.    Consultants:  Psychiatry  Physical therapy.   Procedures:  none  Antibiotics:  none  HPI/Subjective: Wants to work with PT, requesting to start his zanax.   Objective: Filed Vitals:   02/22/15 1803  BP: 123/76  Pulse: 60  Temp: 97.8 F (36.6 C)  Resp: 18    Intake/Output Summary (Last 24 hours) at 02/22/15 1956 Last data filed at 02/22/15 1800  Gross per 24 hour  Intake 1562.75 ml  Output   1250 ml  Net 312.75 ml   Filed Weights   02/21/15 2015  Weight: 80.4 kg (177 lb 4 oz)    Exam:   General:  Alert afebrile comfortable,   Cardiovascular: s1s2  Respiratory: clear, no wheezing or rhonchi  Abdomen: soft non tender non distended bowel sounds heard.   Musculoskeletal: bilateral BKA.   Data Reviewed: Basic Metabolic Panel:  Recent Labs Lab 02/21/15 1529  NA 142  K 3.8  CL 110  CO2 27  GLUCOSE 100*  BUN 15  CREATININE 0.65  CALCIUM 8.8*  MG 1.9   Liver Function Tests:  Recent Labs Lab 02/21/15 1529  AST 33  ALT 24  ALKPHOS 96  BILITOT 0.7  PROT 6.3*  ALBUMIN 3.2*    Recent Labs Lab 02/21/15 1529  LIPASE 16*   No results for input(s): AMMONIA in the last 168  hours. CBC:  Recent Labs Lab 02/21/15 1529  WBC 7.6  NEUTROABS 3.5  HGB 13.1  HCT 40.2  MCV 92.4  PLT 208   Cardiac Enzymes: No results for input(s): CKTOTAL, CKMB, CKMBINDEX, TROPONINI in the last 168 hours. BNP (last 3 results)  Recent Labs  02/21/15 1530  BNP 66.9    ProBNP (last 3 results) No results for input(s): PROBNP in the last 8760 hours.  CBG: No results for input(s): GLUCAP in the last 168 hours.  Recent Results (from the past 240 hour(s))  MRSA PCR Screening     Status: None   Collection Time: 02/21/15  8:26 PM  Result Value Ref Range Status   MRSA by PCR NEGATIVE NEGATIVE Final    Comment:        The GeneXpert MRSA Assay (FDA approved for NASAL specimens only), is one component of a comprehensive MRSA colonization surveillance program. It is not intended to diagnose MRSA infection nor to guide or monitor treatment for MRSA infections.      Studies: Dg Chest Port 1 View  02/21/2015  CLINICAL DATA:  Cough lethargy EXAM: PORTABLE CHEST 1 VIEW COMPARISON:  None currently available FINDINGS: Low lung volumes with diffuse interstitial coarsening. Borderline cardiomegaly. Negative aortic and hilar contours. There is a bandlike opacity in the right mid lung favoring atelectasis. IMPRESSION: 1. Diffuse interstitial coarsening which  could be bronchitic or congestive. 2. Atelectasis along the right minor fissure. Electronically Signed   By: Marnee Spring M.D.   On: 02/21/2015 15:55    Scheduled Meds: . enoxaparin (LOVENOX) injection  40 mg Subcutaneous Q24H  . hydrOXYzine  50 mg Oral QHS  . mirtazapine  15 mg Oral QHS  . morphine  30 mg Oral Q12H  . nicotine  14 mg Transdermal Daily  . sodium chloride  3 mL Intravenous Q12H   Continuous Infusions: . sodium chloride Stopped (02/22/15 0552)    Principal Problem:   Narcotic overdose Active Problems:   Tobacco abuse   Acute respiratory failure (H[facility])   Opioid overdose   Schizophrenia ([facility])    Chronic pain   Prolonged Q-T interval on ECG   Adjustment disorder with mixed anxiety and depressed mood    Time spent: 25 min    [name] [name]  [facility] Pager [phone] If 7PM-7AM, please contact night-coverage at [email or web address], password [facility] 02/22/2015, 7:56 PM

## 2015-02-22 NOTE — Progress Notes (Signed)
Pt accidentally pulled IV out when turning over in bed. Narcan gtt and NS paused. IV site clean, dry, and intact. Catheter intact with removal. Pt alert and oriented. Pt refused to let RN try to reinsert IV states he "doesn't need sodium water or other medicines" he's been getting. During night pt repeatedly said he was leaving in the morning after talking to the doctors. Tama Gander, [facility] NP was notified. Will continue to monitor patient vitals and mentation.

## 2015-02-23 DIAGNOSIS — F4323 Adjustment disorder with mixed anxiety and depressed mood: Secondary | ICD-10-CM | POA: Diagnosis not present

## 2015-02-23 DIAGNOSIS — G92 Toxic encephalopathy: Secondary | ICD-10-CM | POA: Diagnosis not present

## 2015-02-23 DIAGNOSIS — T40602A Poisoning by unspecified narcotics, intentional self-harm, initial encounter: Secondary | ICD-10-CM | POA: Diagnosis not present

## 2015-02-23 DIAGNOSIS — T402X1D Poisoning by other opioids, accidental (unintentional), subsequent encounter: Secondary | ICD-10-CM

## 2015-02-23 DIAGNOSIS — J9601 Acute respiratory failure with hypoxia: Secondary | ICD-10-CM

## 2015-02-23 DIAGNOSIS — T1491 Suicide attempt: Secondary | ICD-10-CM | POA: Diagnosis not present

## 2015-02-23 DIAGNOSIS — F209 Schizophrenia, unspecified: Secondary | ICD-10-CM | POA: Diagnosis not present

## 2015-02-23 MED ORDER — MORPHINE SULFATE 30 MG PO TABS: 30.0000 mg | ORAL_TABLET | Freq: Two times a day (BID) | ORAL | Status: DC

## 2015-02-23 MED ORDER — MIRTAZAPINE 15 MG PO TBDP: 15.0000 mg | ORAL_TABLET | Freq: Every day | ORAL | Status: DC

## 2015-02-23 MED ORDER — MORPHINE SULFATE ER 30 MG PO TBCR: 30.0000 mg | EXTENDED_RELEASE_TABLET | Freq: Two times a day (BID) | ORAL | Status: DC

## 2015-02-23 MED ORDER — ACETAMINOPHEN 325 MG PO TABS: 650.0000 mg | ORAL_TABLET | Freq: Four times a day (QID) | ORAL | Status: DC | PRN

## 2015-02-23 MED ORDER — ALPRAZOLAM 1 MG PO TABS: 1.0000 mg | ORAL_TABLET | Freq: Two times a day (BID) | ORAL | Status: DC | PRN

## 2015-02-23 NOTE — Consult Note (Signed)
[place] Psychiatry Consult   Reason for Consult:  drug overdose Referring Physician:  Dr. Karleen Hampshire Patient Identification: [name] [name] MRN:  [id number] Principal Diagnosis: Narcotic overdose Diagnosis:   Patient Active Problem List   Diagnosis Date Noted  . Adjustment disorder with mixed anxiety and depressed mood [F43.23] 02/22/2015  . Narcotic overdose [T40.601A] 02/21/2015  . Acute respiratory failure ([place]) [J96.00] 02/21/2015  . Opioid overdose [T40.2X1A] 02/21/2015  . Schizophrenia ([place]) [F20.9] 02/21/2015  . Chronic pain [G89.29] 02/21/2015  . Prolonged Q-T interval on ECG [I45.81] 02/21/2015  . S/P bilateral above knee amputation ([place]) [Z60.109, Z89.612] 11/24/2013  . Atrial fibrillation ([place]) [I48.91] 11/24/2013  . Paroxysmal supraventricular tachycardia ([place]) [I47.1] 11/24/2013  . Pseudomembranous colitis [A04.7] 11/24/2013  . Seizure disorder ([place]) [N23.557] 11/24/2013  . Tobacco abuse [Z72.0] 11/24/2013    Total Time spent with patient: 30 minutes  Subjective:   [name] [name] is a 59 y.o. male patient admitted with somatic depression and insomnia.  HPI:  [name] [name] 59 year old male seen face-to-face for psychiatric consultation and evaluation of overdose of opiates. Patient is living in a motel, and has been taking his medication from a psychiatrist and seeing a counselor in [place]. Reportedly patient was at counselor's office with the opiate overdose, drowsy and sleepy not able to respond with the counselor which is a concern he was sent to the hospital. Patient was admitted to psychiatric hospital when he was younger 59 years old but denies current suicidal or homicidal ideation. Patient repeatedly denied intentional overdose of opiates. Patient reported he is currently waiting for the placement. Patient has bilateral AKA and with a history of schizophrenia. Patient has been awake, alert, oriented to time place person but has a poor memory about his  medications and provides. Patient consider himself is a homeless and looking for placement with the help of family members but could not give any details about any family members.  Patient denies current suicidal/homicidal ideation, intention or plans. Patient does not appear to be responding to internal stimuli. Patient stated that he has artificial limbs which was kept in his brother's home. Patient could not give me details about his brother for collateral information.  Past Psychiatric History: Patient has been diagnosed with schizophrenia and opioid abuse versus dependence and has received outpatient medication management from [facility].  Interval history: Patient seen today for psychiatric consultation follow-up. Patient has been transferring himself from the bed to the wheelchair with the help of physical therapist. Patient was found in the hallway rolling with wheelchair and talking with the staff members and also talking about basketball. Patient denies current symptoms of depression, anxiety, suicidal/homicidal ideation, intention or plans. Patient is hypertalkative and has no evidence of auditory or visual hallucinations, delusions or paranoia. Patient stated I'm not seeking drugs and have been taking medication as prescribed by the physicians.  Risk to Self: Is patient at risk for suicide?: No Risk to Others:   Prior Inpatient Therapy:   Prior Outpatient Therapy:    Past Medical History:  Past Medical History  Diagnosis Date  . OCD (obsessive compulsive disorder)   . Schizophrenia ([place])   . MI (myocardial infarction) ([place])   . HTN (hypertension)   . AKA stump complication ([place])   . Chronic pain     Past Surgical History  Procedure Laterality Date  . Above knee leg amputation Bilateral    Family History:  Family History  Problem Relation Age of Onset  . Hypertension Mother   . Hypertension  Father   . Diabetes Mother   . Diabetes Father    Family Psychiatric  History:  Unknown Social History:  History  Alcohol Use No     History  Drug Use No    Social History   Social History  . Marital Status: Single    Spouse Name: N/A  . Number of Children: N/A  . Years of Education: N/A   Social History Main Topics  . Smoking status: Current Every Day Smoker -- 0.50 packs/day    Types: Cigarettes  . Smokeless tobacco: Never Used  . Alcohol Use: No  . Drug Use: No  . Sexual Activity: Not Asked   Other Topics Concern  . None   Social History Narrative   Additional Social History: Patient consider himself is a homeless, disabled and a poor historian.                          Allergies:  No Known Allergies  Labs:  Results for orders placed or performed during the hospital encounter of 02/21/15 (from the past 48 hour(s))  Acetaminophen level     Status: Abnormal   Collection Time: 02/21/15  3:29 PM  Result Value Ref Range   Acetaminophen (Tylenol), Serum <10 (L) 10 - 30 ug/mL    Comment:        THERAPEUTIC CONCENTRATIONS VARY SIGNIFICANTLY. A RANGE OF 10-30 ug/mL MAY BE AN EFFECTIVE CONCENTRATION FOR MANY PATIENTS. HOWEVER, SOME ARE BEST TREATED AT CONCENTRATIONS OUTSIDE THIS RANGE. ACETAMINOPHEN CONCENTRATIONS >150 ug/mL AT 4 HOURS AFTER INGESTION AND >50 ug/mL AT 12 HOURS AFTER INGESTION ARE OFTEN ASSOCIATED WITH TOXIC REACTIONS.   Comprehensive metabolic panel     Status: Abnormal   Collection Time: 02/21/15  3:29 PM  Result Value Ref Range   Sodium 142 135 - 145 mmol/L   Potassium 3.8 3.5 - 5.1 mmol/L   Chloride 110 101 - 111 mmol/L   CO2 27 22 - 32 mmol/L   Glucose, Bld 100 (H) 65 - 99 mg/dL   BUN 15 6 - 20 mg/dL   Creatinine, Ser 0.65 0.61 - 1.24 mg/dL   Calcium 8.8 (L) 8.9 - 10.3 mg/dL   Total Protein 6.3 (L) 6.5 - 8.1 g/dL   Albumin 3.2 (L) 3.5 - 5.0 g/dL   AST 33 15 - 41 U/L   ALT 24 17 - 63 U/L   Alkaline Phosphatase 96 38 - 126 U/L   Total Bilirubin 0.7 0.3 - 1.2 mg/dL   GFR calc non Af Amer >60 >60 mL/min    GFR calc Af Amer >60 >60 mL/min    Comment: (NOTE) The eGFR has been calculated using the CKD EPI equation. This calculation has not been validated in all clinical situations. eGFR's persistently <60 mL/min signify possible Chronic Kidney Disease.    Anion gap 5 5 - 15  Ethanol     Status: None   Collection Time: 02/21/15  3:29 PM  Result Value Ref Range   Alcohol, Ethyl (B) <5 <5 mg/dL    Comment:        LOWEST DETECTABLE LIMIT FOR SERUM ALCOHOL IS 5 mg/dL FOR MEDICAL PURPOSES ONLY   Lipase, blood     Status: Abnormal   Collection Time: 02/21/15  3:29 PM  Result Value Ref Range   Lipase 16 (L) 22 - 51 U/L  Salicylate level     Status: None   Collection Time: 02/21/15  3:29 PM  Result Value Ref  Range   Salicylate Lvl <5.1 2.8 - 30.0 mg/dL  CBC with Differential     Status: None   Collection Time: 02/21/15  3:29 PM  Result Value Ref Range   WBC 7.6 4.0 - 10.5 K/uL   RBC 4.35 4.22 - 5.81 MIL/uL   Hemoglobin 13.1 13.0 - 17.0 g/dL   HCT 40.2 39.0 - 52.0 %   MCV 92.4 78.0 - 100.0 fL   MCH 30.1 26.0 - 34.0 pg   MCHC 32.6 30.0 - 36.0 g/dL   RDW 13.7 11.5 - 15.5 %   Platelets 208 150 - 400 K/uL   Neutrophils Relative % 46 %   Neutro Abs 3.5 1.7 - 7.7 K/uL   Lymphocytes Relative 37 %   Lymphs Abs 2.8 0.7 - 4.0 K/uL   Monocytes Relative 12 %   Monocytes Absolute 0.9 0.1 - 1.0 K/uL   Eosinophils Relative 4 %   Eosinophils Absolute 0.3 0.0 - 0.7 K/uL   Basophils Relative 1 %   Basophils Absolute 0.1 0.0 - 0.1 K/uL  Magnesium     Status: None   Collection Time: 02/21/15  3:29 PM  Result Value Ref Range   Magnesium 1.9 1.7 - 2.4 mg/dL  Brain natriuretic peptide     Status: None   Collection Time: 02/21/15  3:30 PM  Result Value Ref Range   B Natriuretic Peptide 66.9 0.0 - 100.0 pg/mL  I-Stat CG4 Lactic Acid, ED     Status: None   Collection Time: 02/21/15  3:39 PM  Result Value Ref Range   Lactic Acid, Venous 0.81 0.5 - 2.0 mmol/L  Blood gas, arterial     Status:  Abnormal   Collection Time: 02/21/15  6:45 PM  Result Value Ref Range   O2 Content 2.0 L/min   Delivery systems NASAL CANNULA    pH, Arterial 7.408 7.350 - 7.450   pCO2 arterial 40.3 35.0 - 45.0 mmHg   pO2, Arterial 74.7 (L) 80.0 - 100.0 mmHg   Bicarbonate 25.0 (H) 20.0 - 24.0 mEq/L   TCO2 22.1 0 - 100 mmol/L   Acid-Base Excess 0.8 0.0 - 2.0 mmol/L   O2 Saturation 94.8 %   Patient temperature 98.6    Collection site BRACHIAL ARTERY    Drawn by [id number]    Sample type ARTERIAL DRAW   MRSA PCR Screening     Status: None   Collection Time: 02/21/15  8:26 PM  Result Value Ref Range   MRSA by PCR NEGATIVE NEGATIVE    Comment:        The GeneXpert MRSA Assay (FDA approved for NASAL specimens only), is one component of a comprehensive MRSA colonization surveillance program. It is not intended to diagnose MRSA infection nor to guide or monitor treatment for MRSA infections.   Urine rapid drug screen (hosp performed)     Status: Abnormal   Collection Time: 02/21/15 10:43 PM  Result Value Ref Range   Opiates POSITIVE (A) NONE DETECTED   Cocaine NONE DETECTED NONE DETECTED   Benzodiazepines POSITIVE (A) NONE DETECTED   Amphetamines NONE DETECTED NONE DETECTED   Tetrahydrocannabinol NONE DETECTED NONE DETECTED   Barbiturates POSITIVE (A) NONE DETECTED    Comment:        DRUG SCREEN FOR MEDICAL PURPOSES ONLY.  IF CONFIRMATION IS NEEDED FOR ANY PURPOSE, NOTIFY LAB WITHIN 5 DAYS.        LOWEST DETECTABLE LIMITS FOR URINE DRUG SCREEN Drug Class       Cutoff (ng/mL)  Amphetamine      1000 Barbiturate      200 Benzodiazepine   250 Tricyclics       539 Opiates          300 Cocaine          300 THC              50     Current Facility-Administered Medications  Medication Dose Route Frequency Provider Last Rate Last Dose  . acetaminophen (TYLENOL) tablet 650 mg  650 mg Oral Q6H PRN Waldemar Dickens, MD       Or  . acetaminophen (TYLENOL) suppository 650 mg  650 mg Rectal Q6H PRN  Waldemar Dickens, MD      . ALPRAZolam Duanne Moron) tablet 1 mg  1 mg Oral TID PRN Hosie Poisson, MD   1 mg at 02/23/15 1128  . enoxaparin (LOVENOX) injection 40 mg  40 mg Subcutaneous Q24H Waldemar Dickens, MD   40 mg at 02/22/15 2209  . hydrOXYzine (ATARAX/VISTARIL) tablet 50 mg  50 mg Oral QHS Ambrose Finland, MD   50 mg at 02/22/15 2206  . ketorolac (TORADOL) 30 MG/ML injection 30 mg  30 mg Intravenous Q8H PRN Waldemar Dickens, MD   30 mg at 02/22/15 2209  . mirtazapine (REMERON SOL-TAB) disintegrating tablet 15 mg  15 mg Oral QHS Ambrose Finland, MD   15 mg at 02/22/15 2209  . morphine (MS CONTIN) 12 hr tablet 30 mg  30 mg Oral Q12H Hosie Poisson, MD   30 mg at 02/23/15 1128  . nicotine (NICODERM CQ - dosed in mg/24 hours) patch 14 mg  14 mg Transdermal Daily Waldemar Dickens, MD   14 mg at 02/23/15 1127  . ondansetron (ZOFRAN) tablet 4 mg  4 mg Oral Q6H PRN Waldemar Dickens, MD       Or  . ondansetron [facility]) injection 4 mg  4 mg Intravenous Q6H PRN Waldemar Dickens, MD      . sodium chloride 0.9 % injection 3 mL  3 mL Intravenous Q12H Waldemar Dickens, MD   3 mL at 02/21/15 2200    Musculoskeletal: Strength & Muscle Tone: within normal limits Gait & Station: Bilateral AKA Patient leans: N/A  Psychiatric Specialty Exam: ROS   Blood pressure 114/48, pulse 62, temperature 97.7 F (36.5 C), temperature source Oral, resp. rate 18, height 6' 1"  (1.854 m), weight 80.4 kg (177 lb 4 oz), SpO2 98 %.Body mass index is 23.39 kg/(m^2).  General Appearance: Bizarre and Guarded  Eye Contact::  Good  Speech:  Pressured  Volume:  Increased  Mood:  Euphoric  Affect:  Congruent and Labile  Thought Process:  Irrelevant and Tangential  Orientation:  Full (Time, Place, and Person)  Thought Content:  WDL  Suicidal Thoughts:  No  Homicidal Thoughts:  No  Memory:  Immediate;   Fair Recent;   Fair  Judgement:  Fair  Insight:  Fair  Psychomotor Activity:  Restlessness  Concentration:  Fair   Recall:  [employer] of Knowledge:Good  Language: Good  Akathisia:  Negative  Handed:  Right  AIMS (if indicated):     Assets:  Communication Skills Desire for Improvement Financial Resources/Insurance Leisure Time Resilience Social Support  ADL's:  Impaired  Cognition: Impaired,  Mild  Sleep:      Treatment Plan Summary: Daily contact with patient to assess and evaluate symptoms and progress in treatment and Medication management   Refer to the psychiatric  social services regarding obtaining collateral information from the [facility] psychiatry and also primary care physician possible regarding psychiatric medication management.   Patient has been free from the suicidal ideation and contracts for safety during this evaluation.   May contact patient Brother who has been resourceful and helpful to the patient  No additional psychiatric medication was recommended at this time  Disposition: Patient will be referred to the outpatient medication management when medically stable. Patient does not meet criteria for psychiatric inpatient admission. Supportive therapy provided about ongoing stressors. Appreciate psychiatric consultation and we sign off at this time Please contact [phone] or [phone] if needs further assistance   [name] [name],JANARDHAHA R. 02/23/2015 2:51 PM

## 2015-02-23 NOTE — Evaluation (Addendum)
Physical Therapy Evaluation-1x Patient Details Name: [name] [name] MRN: [id number] DOB: 1956/04/22 Today's Date: 02/23/2015   History of Present Illness  59 yo male admitted with narcotic overdose, acute encephalopathy. hx of chronic pain, OCD, schizophrenia, MI, HTN, bil AKA.   Clinical Impression  On eval pt was supervision-Mod Ind assist for mobility-able to perform lateral transfer from bed into wheelchair with only 1-2 cues for safety. Cues most likely due to pt appearing distracted and very talkative. Pt wheeled himself in wheelchair with Mod Ind along entire hallway. Questioned pt about LE prosthetics-pt reports he hasn't worn them in at least a year. Would recommend OP PT follow up for evaluation of prosthetics + equipment to determine if they still fit appropriately and prosthetic gait training, if warranted. Pt has been mobilizing at wheelchair level prior to admission for quite a while and pt has demonstrated the ability to continue to do that.     Follow Up Recommendations No acute PT follow up (OP PT if pt would like to follow up for prosthetic eval/gait training)    Equipment Recommendations  None recommended by PT    Recommendations for Other Services       Precautions / Restrictions Precautions Precautions: None Precaution Comments: bil AKA Restrictions Weight Bearing Restrictions: No      Mobility  Bed Mobility Overal bed mobility: Modified Independent                Transfers Overall transfer level: Needs assistance   Transfers: Lateral/Scoot Transfers          Lateral/Scoot Transfers: Supervision General transfer comment: Pt performed lateral transfer be to wheelchair with supervision assist: set up, cues for operation of armrest.   Ambulation/Gait             General Gait Details: NA  Research scientist (physical sciences) Wheelchair mobility: Yes Wheelchair propulsion: Both upper extremities Wheelchair  parts: Independent Distance: 350 feet  Modified Rankin (Stroke Patients Only)       Balance Overall balance assessment: Modified Independent (good dynamic and static balance. )                                           Pertinent Vitals/Pain Pain Assessment: No/denies pain    Home Living Family/patient expects to be discharged to:: Shelter/Homeless               Home Equipment: Wheelchair - manual      Prior Function Level of Independence: Independent with assistive device(s)               Hand Dominance        Extremity/Trunk Assessment   Upper Extremity Assessment: Overall WFL for tasks assessed           Lower Extremity Assessment: RLE deficits/detail;LLE deficits/detail RLE Deficits / Details: AKA LLE Deficits / Details: AKA  Cervical / Trunk Assessment: Normal  Communication   Communication: No difficulties  Cognition Arousal/Alertness: Awake/alert Behavior During Therapy: Anxious Overall Cognitive Status: History of cognitive impairments - at baseline                      General Comments      Exercises        Assessment/Plan    PT Assessment Patent does not need any further PT  services  PT Diagnosis     PT Problem List    PT Treatment Interventions     PT Goals (Current goals can be found in the Care Plan section) Acute Rehab PT Goals Patient Stated Goal: to go to a rehab PT Goal Formulation: All assessment and education complete, DC therapy    Frequency     Barriers to discharge        Co-evaluation               End of Session   Activity Tolerance: Patient tolerated treatment well Patient left: in chair;with call bell/phone within reach (pt declined back to bed. Made RN aware that pt was remaining in wheelchair at his request.)      Functional Assessment Tool Used: clinical judgement Functional Limitation: Mobility: Walking and moving around Mobility: Walking and Moving Around  Current Status [phone]): At least 1 percent but less than 20 percent impaired, limited or restricted Mobility: Walking and Moving Around Goal Status [phone]): At least 1 percent but less than 20 percent impaired, limited or restricted Mobility: Walking and Moving Around Discharge Status [phone]): At least 1 percent but less than 20 percent impaired, limited or restricted    Time: 1330-1409 PT Time Calculation (min) (ACUTE ONLY): 39 min   Charges:   PT Evaluation $Initial PT Evaluation Tier I: 1 Procedure     PT G Codes:   PT G-Codes **NOT FOR INPATIENT CLASS** Functional Assessment Tool Used: clinical judgement Functional Limitation: Mobility: Walking and moving around Mobility: Walking and Moving Around Current Status (B1478): At least 1 percent but less than 20 percent impaired, limited or restricted Mobility: Walking and Moving Around Goal Status [phone]): At least 1 percent but less than 20 percent impaired, limited or restricted Mobility: Walking and Moving Around Discharge Status [phone]): At least 1 percent but less than 20 percent impaired, limited or restricted    Rebeca Alert, MPT Pager: [phone]

## 2015-02-23 NOTE — Progress Notes (Signed)
Nursing Discharge Summary  Patient ID: [name] [name] MRN: [id number] DOB/AGE: Feb 18, 1956 59 y.o.  Admit date: 02/21/2015 Discharge date: 02/23/2015  Discharged Condition: good  Disposition: Shelter    Prescriptions Given: Prescritpion for Xanax and Remeron given.  Patient verbalized understanding.  Patient was instructed on instructions to shelter from [place], [place].  Means of Discharge: Patient is in personal wheelchair and will be discharged to the bus stop so that he can get to the shelter.  He was given a bus pass to get there from social work.   Signed: Zyah, [place] 02/23/2015, 5:42 PM

## 2015-02-23 NOTE — Discharge Summary (Signed)
Physician Discharge Summary  [name] [name] JTT:[id number] DOB: 09-May-1955 DOA: 02/21/2015  PCP: No primary care provider on file.  Admit date: 02/21/2015 Discharge date: 02/23/2015  Time spent:  Recommendations for Outpatient Follow-up:   Discharge Diagnoses:    Encephalopathy due to Polypharmacy   Narcotic overdose   Bilateral AKA   Tobacco abuse   Acute respiratory failure (HCC)   Opioid overdose   Schizophrenia (HCC)   Chronic pain   Prolonged Q-T interval on ECG   Adjustment disorder with mixed anxiety and depressed mood   Discharge Condition: Stable  Diet recommendation: heart healthy  Filed Weights   02/21/15 2015  Weight: 80.4 kg (177 lb 4 oz)    History of present illness:  Chief Complaint: slow breathing  HPI: [name] [name] is a 59 y.o. male  Patient presented to his psychiatrist's office today for routine follow-up when he was noted to have a respiratory rate of 4 breaths per minute and acting very lethargic and sleepy. Patient woke up briefly when stabilized by staff but very quickly returned to his somnolent state. In the ED patient was given 0.4 mg of Narcan and awoke very rapidly, however this only lasted a few minutes and patient needed an additional dose of Narcan to get his respiratory rate back up to a normal state  Hospital Course:  -Decreased responsiveness/Encephalopathy  -DUe to Polypharmacy/Opiod overdose, as per the patient unintentional -required 2 doses of narcan on admission -mentation back to baseline -followed by a Pain clinic in [place] -Psychiatrist consulted, recommended outpatient FU with Psychiatry  Chronic pain syndrome/Bilateral AKA/stump pain -takes MS contin 30mg  BID and MS IR 30mg  PRN at home for pain, started back on MS Contin for chronic pain by Dr.Akula yesterday, he needs help, supervision to appropriately manage his medications -we stopped his MS IR, and would caution adding CNS depressants in the  future  Acute respiratory failure:  -Secondary to narcotics, Resolved  Bilateral AKA -now has prosthetic limbs, yet to use  Homelessness -CSW consulted and following  Consultations:  Psychiatry  Discharge Exam: Filed Vitals:   02/23/15 1500  BP: 117/53  Pulse: 49  Temp: 98.3 F (36.8 C)  Resp: 18    General: AAOx3 Cardiovascular: S1S2/RRR Respiratory: CTAB B/L AKA  Discharge Instructions    Current Discharge Medication List    START taking these medications   Details  acetaminophen (TYLENOL) 325 MG tablet Take 2 tablets (650 mg total) by mouth every 6 (six) hours as needed for mild pain (or Fever >/= 101).    mirtazapine (REMERON SOL-TAB) 15 MG disintegrating tablet Take 1 tablet (15 mg total) by mouth at bedtime. Qty: 30 tablet, Refills: 0    morphine (MS CONTIN) 30 MG 12 hr tablet Take 1 tablet (30 mg total) by mouth every 12 (twelve) hours. Qty: 30 tablet, Refills: 0      CONTINUE these medications which have CHANGED   Details  ALPRAZolam (XANAX) 1 MG tablet Take 1 tablet (1 mg total) by mouth 2 (two) times daily as needed for anxiety. Qty: 30 tablet, Refills: 0      STOP taking these medications     oxycodone (ROXICODONE) 30 MG immediate release tablet      morphine (MSIR) 30 MG tablet        No Known Allergies    The results of significant diagnostics from this hospitalization (including imaging, microbiology, ancillary and laboratory) are listed below for reference.    Significant Diagnostic Studies: Dg Chest Port 1  View  02/21/2015  CLINICAL DATA:  Cough lethargy EXAM: PORTABLE CHEST 1 VIEW COMPARISON:  None currently available FINDINGS: Low lung volumes with diffuse interstitial coarsening. Borderline cardiomegaly. Negative aortic and hilar contours. There is a bandlike opacity in the right mid lung favoring atelectasis. IMPRESSION: 1. Diffuse interstitial coarsening which could be bronchitic or congestive. 2. Atelectasis along the right  minor fissure. Electronically Signed   By: Marnee Spring M.D.   On: 02/21/2015 15:55    Microbiology: Recent Results (from the past 240 hour(s))  MRSA PCR Screening     Status: None   Collection Time: 02/21/15  8:26 PM  Result Value Ref Range Status   MRSA by PCR NEGATIVE NEGATIVE Final    Comment:        The GeneXpert MRSA Assay (FDA approved for NASAL specimens only), is one component of a comprehensive MRSA colonization surveillance program. It is not intended to diagnose MRSA infection nor to guide or monitor treatment for MRSA infections.      Labs: Basic Metabolic Panel:  Recent Labs Lab 02/21/15 1529  NA 142  K 3.8  CL 110  CO2 27  GLUCOSE 100*  BUN 15  CREATININE 0.65  CALCIUM 8.8*  MG 1.9   Liver Function Tests:  Recent Labs Lab 02/21/15 1529  AST 33  ALT 24  ALKPHOS 96  BILITOT 0.7  PROT 6.3*  ALBUMIN 3.2*    Recent Labs Lab 02/21/15 1529  LIPASE 16*   No results for input(s): AMMONIA in the last 168 hours. CBC:  Recent Labs Lab 02/21/15 1529  WBC 7.6  NEUTROABS 3.5  HGB 13.1  HCT 40.2  MCV 92.4  PLT 208   Cardiac Enzymes: No results for input(s): CKTOTAL, CKMB, CKMBINDEX, TROPONINI in the last 168 hours. BNP: BNP (last 3 results)  Recent Labs  02/21/15 1530  BNP 66.9    ProBNP (last 3 results) No results for input(s): PROBNP in the last 8760 hours.  CBG: No results for input(s): GLUCAP in the last 168 hours.     SignedZannie Cove  [facility] 02/23/2015, 3:10 PM

## 2015-02-23 NOTE — Clinical Social Work Note (Addendum)
Clinical Social Work Assessment  Patient Details  Name: Gahel Safley MRN: [id number] Date of Birth: November 27, 1955  Date of referral:  02/22/15               Reason for consult:  Facility Placement, Mental Health Concerns                Permission sought to share information with:   (denied) Permission granted to share information::  No  Name::        Agency::     Relationship::     Contact Information:     Housing/Transportation Living arrangements for the past 2 months:  Hotel/Motel, Homeless Source of Information:  Patient Patient Interpreter Needed:  None Criminal Activity/Legal Involvement Pertinent to Current Situation/Hospitalization:  No - Comment as needed Significant Relationships:  Siblings Lives with:  Self Do you feel safe going back to the place where you live?  No Need for family participation in patient care:  No (Coment)  Care giving concerns:  CSW received consult for snf placement, behavioral health concerns. Patient seen by psychiatrist, and does not meet inpatient treatment criteria.    Social Worker assessment / plan:  CSW and colleague met with pt at bedisde. Pt denied any SI/HI/Ah/VH. Patient shared that he has been in chronic pain and has high dosages of pain medication but he did not take too much or intentionally overdose on his medications. Patient shares that the wants to go a facility, to help him walk again using his prosthesis. Pt has pressured speech, and often tangential in speech. Pt has to be redirected through out assessment regarding patient dispositon goals, support, resources, and options available.   Pt shares that he wants a facility but is not ready to go there today. He would need a week or so to get his belongings together before commiting to a placement. Pt also shares that he has to get a place established to get his check back. Patient shares that he would go anywhere willing to help him, he would clean floors, become their slave,  They  could even experiement if it helped him get back on his prothesis and walk again. Pt reports that his prosthetic legs are at his brothers house. Pt states his brother is deaf and mute, and raising a family. Pt states he doenst want to bother him but is prosthetic legs were in safe storage. Pt states that he wants to go to a facility but will not commit unless he sees the place. Pt states that otherwise he will find somewhere to go.   Pt states upon admission, patient was at [facility], for counseling regarding the tragic death of his nephew who was killed in a pediestrian/car accident. Patient states that he plans to follow up there with mental health.   CSW and pt discussed shelter options, IRC, placement process, and long term medicaid. Pt shares that he can do all of his adls expcept get him into the shower by himself. Patient reports that he does not have anwywhere to go, did not have a plan the day he went to counseling, and would like to stay in the hosptial until he sorts out a plan. CSW and pt discussed once pt is medically stable pt would be discharged.   There is concern regarding patient limited mobility, history of mental health, and homelessness. CSW discussed with attending.   Pt discharged with resources to [employer], bus pass, [place].   Employment  status:  Disabled (Comment on whether or not currently receiving Disability) Insurance information:  Medicare, Medicaid In [place] PT Recommendations:  No Follow Up Information / Referral to community resources:  Shelter, Other (Comment Required) (assisted living/adult care home)  Patient/Family's Response to care:  Pt verbalized understanding. HOwever disappointed that alf placement could not be arranged at this time.   Patient/Family's Understanding of and Emotional Response to Diagnosis, Current Treatment, and Prognosis:  Pt verbalized understanding. Pt states that he does not understand why  "those who have little in life don't get the bare minimum they reiceve, and all the politicians get way more than what they need. Patient states that he is not upset at [place] regarding disposition, but upset that he can't get into a facility once he gets things worked out. Pt states that he has belongings in [place] he would have to get to and to get his check. Pt understanding of dispositon to shelter to finalize his needs. Pt [facility].   Emotional Assessment Appearance:  Appears older than stated age Attitude/Demeanor/Rapport:   (pressured speech, anxious) Affect (typically observed):  Anxious, Irritable Orientation:  Oriented to Self, Oriented to Place, Oriented to Situation, Oriented to  Time Alcohol / Substance use:  Not Applicable Psych involvement (Current and /or in the community):  Yes (Comment)  Discharge Needs  Concerns to be addressed:  Homelessness, Mental Health Concerns Readmission within the last 30 days:  No Current discharge risk:  None Barriers to Discharge:  Barriers Resolved   Geniya Fulgham, [place], LCSW 02/23/2015, 9:51 PM

## 2015-02-23 NOTE — Progress Notes (Addendum)
CSW met with pt at bedisde. Pt shared that he can not get his money right now cause he has to get it set up to receive his check again. Pt states that he is not ready to sign into an ALF because he's got to take care of some things first. Pt states that even if discharged today or tomorrow he wouldn't be going straight to the alf. CSW asked patient where he would go, patient stated he did not know. Pt states he would need atleast a week. CSW informed patient he would not be able to remain in the hosptial during that time as he is not meeting criteria. CSW provided bus pass, which pt states he can navigate and ride, and shelter information. Pt states that he will make something work. Pt states he plans to follow up with his established care provider, [facility] for counseling and his pcp for medicatons.   Belia Heman, [place] Work  [employer] [phone]

## 2015-02-23 NOTE — Progress Notes (Addendum)
Pt under review at [facility]. CSW discussed with EDP. If [facility] not [facility] to [facility] patient to be discharged with shelter information. Pt states that he travels with the bus system and will find somewhere to stay. CSW and pt discussed at length process for apply for long term medicaid. Pt also stated that he may or may not stay at the ALF depending upon his impression. He also does not have access to his check yet, because he has to call to get it sent to him.   Olga Coaster, LCSW  Clinical Social Work  [employer] [phone] covering

## 2015-05-08 DIAGNOSIS — S42309A Unspecified fracture of shaft of humerus, unspecified arm, initial encounter for closed fracture: Secondary | ICD-10-CM

## 2015-05-08 HISTORY — DX: Unspecified fracture of shaft of humerus, unspecified arm, initial encounter for closed fracture: S42.309A

## 2015-05-13 ENCOUNTER — Encounter (HOSPITAL_COMMUNITY): Payer: Self-pay | Admitting: Nurse Practitioner

## 2015-05-13 ENCOUNTER — Emergency Department (HOSPITAL_COMMUNITY): Payer: Medicare Other

## 2015-05-13 ENCOUNTER — Inpatient Hospital Stay (HOSPITAL_COMMUNITY)
Admission: EM | Admit: 2015-05-13 | Discharge: 2015-05-17 | DRG: 193 | Disposition: A | Discharge status: Skilled Nursing Facility | Payer: Medicare Other | Attending: Internal Medicine | Admitting: Internal Medicine

## 2015-05-13 ENCOUNTER — Inpatient Hospital Stay (HOSPITAL_COMMUNITY): Payer: Medicare Other

## 2015-05-13 DIAGNOSIS — S42302A Unspecified fracture of shaft of humerus, left arm, initial encounter for closed fracture: Secondary | ICD-10-CM

## 2015-05-13 DIAGNOSIS — Y92009 Unspecified place in unspecified non-institutional (private) residence as the place of occurrence of the external cause: Secondary | ICD-10-CM

## 2015-05-13 DIAGNOSIS — Z590 Homelessness: Secondary | ICD-10-CM

## 2015-05-13 DIAGNOSIS — F429 Obsessive-compulsive disorder, unspecified: Secondary | ICD-10-CM | POA: Diagnosis present

## 2015-05-13 DIAGNOSIS — Z720 Tobacco use: Secondary | ICD-10-CM | POA: Diagnosis not present

## 2015-05-13 DIAGNOSIS — J9602 Acute respiratory failure with hypercapnia: Secondary | ICD-10-CM

## 2015-05-13 DIAGNOSIS — S42255A Nondisplaced fracture of greater tuberosity of left humerus, initial encounter for closed fracture: Secondary | ICD-10-CM | POA: Diagnosis present

## 2015-05-13 DIAGNOSIS — F1010 Alcohol abuse, uncomplicated: Secondary | ICD-10-CM | POA: Diagnosis present

## 2015-05-13 DIAGNOSIS — W050XXA Fall from non-moving wheelchair, initial encounter: Secondary | ICD-10-CM | POA: Diagnosis present

## 2015-05-13 DIAGNOSIS — J9811 Atelectasis: Secondary | ICD-10-CM | POA: Diagnosis present

## 2015-05-13 DIAGNOSIS — G894 Chronic pain syndrome: Secondary | ICD-10-CM | POA: Diagnosis present

## 2015-05-13 DIAGNOSIS — Y92811 Bus as the place of occurrence of the external cause: Secondary | ICD-10-CM | POA: Diagnosis not present

## 2015-05-13 DIAGNOSIS — J189 Pneumonia, unspecified organism: Secondary | ICD-10-CM

## 2015-05-13 DIAGNOSIS — S42301A Unspecified fracture of shaft of humerus, right arm, initial encounter for closed fracture: Secondary | ICD-10-CM | POA: Diagnosis not present

## 2015-05-13 DIAGNOSIS — I10 Essential (primary) hypertension: Secondary | ICD-10-CM | POA: Diagnosis present

## 2015-05-13 DIAGNOSIS — Y95 Nosocomial condition: Secondary | ICD-10-CM | POA: Diagnosis present

## 2015-05-13 DIAGNOSIS — T428X1A Poisoning by antiparkinsonism drugs and other central muscle-tone depressants, accidental (unintentional), initial encounter: Secondary | ICD-10-CM | POA: Diagnosis present

## 2015-05-13 DIAGNOSIS — J9600 Acute respiratory failure, unspecified whether with hypoxia or hypercapnia: Secondary | ICD-10-CM | POA: Diagnosis present

## 2015-05-13 DIAGNOSIS — S42309A Unspecified fracture of shaft of humerus, unspecified arm, initial encounter for closed fracture: Secondary | ICD-10-CM | POA: Diagnosis present

## 2015-05-13 DIAGNOSIS — G8929 Other chronic pain: Secondary | ICD-10-CM | POA: Diagnosis present

## 2015-05-13 DIAGNOSIS — F17210 Nicotine dependence, cigarettes, uncomplicated: Secondary | ICD-10-CM | POA: Diagnosis present

## 2015-05-13 DIAGNOSIS — F1110 Opioid abuse, uncomplicated: Secondary | ICD-10-CM | POA: Diagnosis present

## 2015-05-13 DIAGNOSIS — Z833 Family history of diabetes mellitus: Secondary | ICD-10-CM

## 2015-05-13 DIAGNOSIS — I480 Paroxysmal atrial fibrillation: Secondary | ICD-10-CM | POA: Diagnosis present

## 2015-05-13 DIAGNOSIS — G92 Toxic encephalopathy: Secondary | ICD-10-CM | POA: Diagnosis present

## 2015-05-13 DIAGNOSIS — Z89612 Acquired absence of left leg above knee: Secondary | ICD-10-CM | POA: Diagnosis not present

## 2015-05-13 DIAGNOSIS — Z8249 Family history of ischemic heart disease and other diseases of the circulatory system: Secondary | ICD-10-CM

## 2015-05-13 DIAGNOSIS — J9601 Acute respiratory failure with hypoxia: Secondary | ICD-10-CM | POA: Diagnosis present

## 2015-05-13 DIAGNOSIS — G9340 Encephalopathy, unspecified: Secondary | ICD-10-CM | POA: Diagnosis not present

## 2015-05-13 DIAGNOSIS — I252 Old myocardial infarction: Secondary | ICD-10-CM | POA: Diagnosis not present

## 2015-05-13 DIAGNOSIS — F209 Schizophrenia, unspecified: Secondary | ICD-10-CM | POA: Diagnosis present

## 2015-05-13 DIAGNOSIS — Z993 Dependence on wheelchair: Secondary | ICD-10-CM | POA: Diagnosis not present

## 2015-05-13 DIAGNOSIS — Z89611 Acquired absence of right leg above knee: Secondary | ICD-10-CM | POA: Diagnosis not present

## 2015-05-13 DIAGNOSIS — I4891 Unspecified atrial fibrillation: Secondary | ICD-10-CM | POA: Diagnosis not present

## 2015-05-13 DIAGNOSIS — S42302S Unspecified fracture of shaft of humerus, left arm, sequela: Secondary | ICD-10-CM | POA: Diagnosis not present

## 2015-05-13 HISTORY — DX: Opioid abuse, uncomplicated: F11.10

## 2015-05-13 HISTORY — DX: Unspecified fracture of shaft of humerus, unspecified arm, initial encounter for closed fracture: S42.309A

## 2015-05-13 LAB — CBC WITH DIFFERENTIAL/PLATELET
BASOS ABS: 0 10*3/uL (ref 0.0–0.1)
Basophils Relative: 0 %
EOS ABS: 0.2 10*3/uL (ref 0.0–0.7)
Eosinophils Relative: 2 %
HCT: 42.6 % (ref 39.0–52.0)
Hemoglobin: 13.9 g/dL (ref 13.0–17.0)
LYMPHS ABS: 3 10*3/uL (ref 0.7–4.0)
LYMPHS PCT: 21 %
MCH: 30.6 pg (ref 26.0–34.0)
MCHC: 32.6 g/dL (ref 30.0–36.0)
MCV: 93.8 fL (ref 78.0–100.0)
Monocytes Absolute: 0.9 10*3/uL (ref 0.1–1.0)
Monocytes Relative: 6 %
Neutro Abs: 10 10*3/uL — ABNORMAL HIGH (ref 1.7–7.7)
Neutrophils Relative %: 71 %
Platelets: 266 10*3/uL (ref 150–400)
RBC: 4.54 MIL/uL (ref 4.22–5.81)
RDW: 14.8 % (ref 11.5–15.5)
WBC: 14.2 10*3/uL — AB (ref 4.0–10.5)

## 2015-05-13 LAB — BASIC METABOLIC PANEL
ANION GAP: 9 (ref 5–15)
BUN: 11 mg/dL (ref 6–20)
CHLORIDE: 109 mmol/L (ref 101–111)
CO2: 24 mmol/L (ref 22–32)
Calcium: 8.9 mg/dL (ref 8.9–10.3)
Creatinine, Ser: 0.7 mg/dL (ref 0.61–1.24)
GFR calc Af Amer: >60 mL/min (ref >60)
GFR calc non Af Amer: >60 mL/min (ref >60)
Glucose, Bld: 152 mg/dL — ABNORMAL HIGH (ref 65–99)
POTASSIUM: 4.4 mmol/L (ref 3.5–5.1)
SODIUM: 142 mmol/L (ref 135–145)

## 2015-05-13 LAB — RAPID URINE DRUG SCREEN, HOSP PERFORMED
Amphetamines: NOT DETECTED
Barbiturates: NOT DETECTED
Benzodiazepines: POSITIVE — AB
Cocaine: NOT DETECTED
Opiates: NOT DETECTED
Tetrahydrocannabinol: NOT DETECTED

## 2015-05-13 LAB — STREP PNEUMONIAE URINARY ANTIGEN: Strep Pneumo Urinary Antigen: NEGATIVE

## 2015-05-13 LAB — VITAMIN B12: VITAMIN B 12: 473 pg/mL (ref 180–914)

## 2015-05-13 MED ORDER — ALBUTEROL SULFATE (2.5 MG/3ML) 0.083% IN NEBU
2.5000 mg | INHALATION_SOLUTION | RESPIRATORY_TRACT | Status: DC | PRN
  Filled 2015-05-13: qty 3

## 2015-05-13 MED ORDER — ONDANSETRON HCL 4 MG PO TABS: 4.0000 mg | ORAL_TABLET | Freq: Four times a day (QID) | ORAL | Status: DC | PRN

## 2015-05-13 MED ORDER — METHOCARBAMOL 500 MG PO TABS
500.0000 mg | ORAL_TABLET | Freq: Once | ORAL | Status: AC
  Administered 2015-05-13: 500 mg via ORAL
  Filled 2015-05-13: qty 1

## 2015-05-13 MED ORDER — THIAMINE HCL 100 MG/ML IJ SOLN
100.0000 mg | Freq: Every day | INTRAMUSCULAR | Status: DC
  Administered 2015-05-13: 100 mg via INTRAVENOUS
  Filled 2015-05-13: qty 2

## 2015-05-13 MED ORDER — MORPHINE SULFATE ER 30 MG PO TBCR: 30.0000 mg | EXTENDED_RELEASE_TABLET | Freq: Two times a day (BID) | ORAL | Status: DC

## 2015-05-13 MED ORDER — ACETAMINOPHEN 325 MG PO TABS: 650.0000 mg | ORAL_TABLET | Freq: Four times a day (QID) | ORAL | Status: DC | PRN

## 2015-05-13 MED ORDER — METHYLPREDNISOLONE SODIUM SUCC 125 MG IJ SOLR
125.0000 mg | Freq: Once | INTRAMUSCULAR | Status: AC
  Administered 2015-05-13: 125 mg via INTRAVENOUS
  Filled 2015-05-13: qty 2

## 2015-05-13 MED ORDER — FOLIC ACID 1 MG PO TABS
1.0000 mg | ORAL_TABLET | Freq: Every day | ORAL | Status: DC
  Administered 2015-05-14 – 2015-05-17 (×4): 1 mg via ORAL
  Filled 2015-05-13 (×4): qty 1

## 2015-05-13 MED ORDER — SODIUM CHLORIDE 0.9 % IV BOLUS (SEPSIS)
1000.0000 mL | Freq: Once | INTRAVENOUS | Status: AC
  Administered 2015-05-13: 1000 mL via INTRAVENOUS

## 2015-05-13 MED ORDER — LORAZEPAM 2 MG/ML IJ SOLN
0.0000 mg | Freq: Four times a day (QID) | INTRAMUSCULAR | Status: AC
  Administered 2015-05-13 – 2015-05-14 (×3): 2 mg via INTRAVENOUS
  Filled 2015-05-13 (×4): qty 1

## 2015-05-13 MED ORDER — LORAZEPAM 1 MG PO TABS: 1.0000 mg | ORAL_TABLET | Freq: Four times a day (QID) | ORAL | Status: AC | PRN

## 2015-05-13 MED ORDER — MIRTAZAPINE 15 MG PO TBDP: 15.0000 mg | ORAL_TABLET | Freq: Every day | ORAL | Status: DC

## 2015-05-13 MED ORDER — BISACODYL 10 MG RE SUPP: 10.0000 mg | Freq: Every day | RECTAL | Status: DC | PRN

## 2015-05-13 MED ORDER — DEXTROSE 5 % IV SOLN
1.0000 g | Freq: Once | INTRAVENOUS | Status: DC
  Filled 2015-05-13: qty 10

## 2015-05-13 MED ORDER — ADULT MULTIVITAMIN W/MINERALS CH
1.0000 | ORAL_TABLET | Freq: Every day | ORAL | Status: DC
Start: 2015-05-13 — End: 2015-05-17
  Administered 2015-05-14 – 2015-05-17 (×4): 1 via ORAL
  Filled 2015-05-13 (×4): qty 1

## 2015-05-13 MED ORDER — SODIUM CHLORIDE 0.9 % IV SOLN
INTRAVENOUS | Status: DC
  Administered 2015-05-13: 75 mL/h via INTRAVENOUS
  Administered 2015-05-13 – 2015-05-16 (×5): via INTRAVENOUS

## 2015-05-13 MED ORDER — ONDANSETRON HCL 4 MG/2ML IJ SOLN: 4.0000 mg | Freq: Four times a day (QID) | INTRAMUSCULAR | Status: DC | PRN

## 2015-05-13 MED ORDER — LORAZEPAM 2 MG/ML IJ SOLN
1.0000 mg | Freq: Four times a day (QID) | INTRAMUSCULAR | Status: AC | PRN
  Administered 2015-05-14 – 2015-05-15 (×2): 1 mg via INTRAVENOUS
  Filled 2015-05-13: qty 1

## 2015-05-13 MED ORDER — AZITHROMYCIN 250 MG PO TABS
500.0000 mg | ORAL_TABLET | ORAL | Status: DC
  Administered 2015-05-14 – 2015-05-17 (×4): 500 mg via ORAL
  Filled 2015-05-13 (×4): qty 2

## 2015-05-13 MED ORDER — ALPRAZOLAM 0.5 MG PO TABS
1.0000 mg | ORAL_TABLET | Freq: Two times a day (BID) | ORAL | Status: DC | PRN
  Administered 2015-05-13 – 2015-05-17 (×8): 1 mg via ORAL
  Filled 2015-05-13 (×8): qty 2

## 2015-05-13 MED ORDER — ENOXAPARIN SODIUM 30 MG/0.3ML ~~LOC~~ SOLN
30.0000 mg | SUBCUTANEOUS | Status: DC
  Administered 2015-05-13: 30 mg via SUBCUTANEOUS
  Filled 2015-05-13: qty 0.3

## 2015-05-13 MED ORDER — ACETAMINOPHEN 650 MG RE SUPP: 650.0000 mg | Freq: Four times a day (QID) | RECTAL | Status: DC | PRN

## 2015-05-13 MED ORDER — DEXTROSE 5 % IV SOLN
1.0000 g | INTRAVENOUS | Status: DC
  Administered 2015-05-14 – 2015-05-17 (×4): 1 g via INTRAVENOUS
  Filled 2015-05-13 (×5): qty 10

## 2015-05-13 MED ORDER — LORAZEPAM 2 MG/ML IJ SOLN: 0.0000 mg | Freq: Two times a day (BID) | INTRAMUSCULAR | Status: DC

## 2015-05-13 MED ORDER — DILTIAZEM LOAD VIA INFUSION
10.0000 mg | Freq: Once | INTRAVENOUS | Status: AC
  Administered 2015-05-13: 10 mg via INTRAVENOUS
  Filled 2015-05-13: qty 10

## 2015-05-13 MED ORDER — POLYETHYLENE GLYCOL 3350 17 G PO PACK: 17.0000 g | PACK | Freq: Every day | ORAL | Status: DC | PRN

## 2015-05-13 MED ORDER — DEXTROSE 5 % IV SOLN
500.0000 mg | Freq: Once | INTRAVENOUS | Status: AC
  Administered 2015-05-13: 500 mg via INTRAVENOUS
  Filled 2015-05-13: qty 500

## 2015-05-13 MED ORDER — VITAMIN B-1 100 MG PO TABS
100.0000 mg | ORAL_TABLET | Freq: Every day | ORAL | Status: DC
  Administered 2015-05-14 – 2015-05-17 (×4): 100 mg via ORAL
  Filled 2015-05-13 (×4): qty 1

## 2015-05-13 MED ORDER — DEXTROSE 5 % IV SOLN
5.0000 mg/h | INTRAVENOUS | Status: DC
  Administered 2015-05-13: 5 mg/h via INTRAVENOUS
  Filled 2015-05-13: qty 100
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
## 2015-05-13 NOTE — Progress Notes (Signed)
[name] [name] is a 60 y.o. male patient admitted from ED awake, alert - oriented  X 4 - no acute distress noted.  VSS - Blood pressure 115/67, pulse 104, temperature 98.1 F (36.7 C), temperature source Oral, resp. rate 22, height 4' (1.219 m), weight 84.1 kg (185 lb 6.5 oz), SpO2 99 %.    IV in place, occlusive dsg intact without redness.  Orientation to room, and floor completed with information packet given to patient/family.  Patient declined safety video at this time.  Admission INP armband ID verified with patient/family, and in place.   SR up x 2, fall assessment complete, with patient to verbalize understanding of risk associated with falls, and verbalized understanding to call nsg before up out of bed.  Call light within reach, patient able to voice, and demonstrate understanding.  Skin, clean-dry- intact without evidence of bruising. Pt has bilateral AKA. Ortho tech at bedside for bilateral sleeves.    Will cont to eval and treat per MD orders.  Bjorn Pippin, RN 05/13/2015 6:54 PM
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
## 2015-05-13 NOTE — Progress Notes (Signed)
Pt is noncompliant with sling removed 3 times and educated that he needs to have it in place.

## 2015-05-13 NOTE — Progress Notes (Signed)
5:24 PM report received from [facility]
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
## 2015-05-13 NOTE — H&P (Signed)
[facility] History and Physical  [name] [name] RUE:[id number] DOB: [birth year] DOA: 05/13/2015  Referring physician: Fayrene Fearing PCP: No primary care provider on file.   Chief Complaint: fall/left arm pain/lethargy/ams  HPI: [name] [name] is a 60 y.o. male past medical history that includes OCD, schizophrenia, hypertension, chronic pain, narcotic abuse, bilateral above-the-knee amputee since emergency Department chief complaint of a fall resulting in pain in his left arm and somnolence. She'll evaluation in the emergency department reveals acute respiratory failure with hypoxemia likely related to pneumonia and/or overuse of narcotics.  Information is obtained from the patient who has little memory of events leading to his presentation. Reportedly he was on a bus and is wheelchair that turned sharply and he fell into the eye on his left side. He began to complain of left arm pain. Reportedly paramedics arrived and he was minimally responsive with a low respiratory rate. He was given 1 mg of Narcan and became alert and responsive. Followed by second dose.   Patient denies any chest pain palpitation headache dizziness syncope or near-syncope. He denies taking additional doses of his morphine and oxycodone as prescribed. Denies abdominal pain nausea vomiting diarrhea constipation. He denies dysuria hematuria frequency or urgency. He denies fever chills cough DOE.  In the emergency department he is afebrile hemodynamically stable his oxygen saturation level drops to 89% on room air.  Review of Systems:  10 point review of systems complete and all systems are negative except as indicated in the history of present illness  Past Medical History  Diagnosis Date  . OCD (obsessive compulsive disorder)   . Schizophrenia (HCC)   . MI (myocardial infarction) (HCC)   . HTN (hypertension)   . AKA stump complication (HCC)   . Chronic pain   . Narcotic abuse    Past Surgical History  Procedure  Laterality Date  . Above knee leg amputation Bilateral    Social History:  reports that he has been smoking Cigarettes.  He has been smoking about 0.50 packs per day. He has never used smokeless tobacco. He reports that he does not drink alcohol or use illicit drugs. Patient is homeless currently staying at a [employer] shelter in [place]. He is wheelchair-bound he has prosthetic legs but has been unable to wear them. No Known Allergies  Family History  Problem Relation Age of Onset  . Hypertension Mother   . Hypertension Father   . Diabetes Mother   . Diabetes Father      Prior to Admission medications   Medication Sig Start Date End Date Taking? Authorizing Provider  acetaminophen (TYLENOL) 325 MG tablet Take 2 tablets (650 mg total) by mouth every 6 (six) hours as needed for mild pain (or Fever >/= 101). 02/23/15   Zannie Cove, MD  ALPRAZolam Prudy Feeler) 1 MG tablet Take 1 tablet (1 mg total) by mouth 2 (two) times daily as needed for anxiety. 02/23/15   Zannie Cove, MD  mirtazapine (REMERON SOL-TAB) 15 MG disintegrating tablet Take 1 tablet (15 mg total) by mouth at bedtime. 02/23/15   Zannie Cove, MD  morphine (MS CONTIN) 30 MG 12 hr tablet Take 1 tablet (30 mg total) by mouth every 12 (twelve) hours. 02/23/15   Zannie Cove, MD   Physical Exam: Filed Vitals:   05/13/15 1145 05/13/15 1200 05/13/15 1245 05/13/15 1330  BP: 133/73 102/64 106/61 113/88  Pulse: 97 97 94 98  Temp:      TempSrc:      Resp:  SpO2: 89% 94% 94% 91%    Wt Readings from Last 3 Encounters:  02/21/15 80.4 kg (177 lb 4 oz)    General:  Appears calm and comfortable Eyes: PERRL, normal lids, irises & conjunctiva ENT: grossly normal hearing, his membranes of his mouth are slightly dry but pink Neck: no LAD, masses or thyromegaly Cardiovascular: RRR, no m/r/g. Lateral AKA Telemetry: SR, no arrhythmias  Respiratory: Normal effort sounds slightly diminished and somewhat coarse but I hear no  wheezes no crackles Abdomen: soft, ntnd Skin: no rash or induration seen on limited exam Musculoskeletal: grossly normal tone BUE/BLE lateral AKA left arm decreased range of motion at shoulder due to pain left shoulder tender to touch Psychiatric: grossly normal mood and affect, speech fluent and appropriate Neurologic: grossly non-focal. speech clear facial symmetry           Labs on Admission:  Basic Metabolic Panel:  Recent Labs Lab 05/13/15 1257  NA 142  K 4.4  CL 109  CO2 24  GLUCOSE 152*  BUN 11  CREATININE 0.70  CALCIUM 8.9   Liver Function Tests: No results for input(s): AST, ALT, ALKPHOS, BILITOT, PROT, ALBUMIN in the last 168 hours. No results for input(s): LIPASE, AMYLASE in the last 168 hours. No results for input(s): AMMONIA in the last 168 hours. CBC:  Recent Labs Lab 05/13/15 1257  WBC 14.2*  NEUTROABS 10.0*  HGB 13.9  HCT 42.6  MCV 93.8  PLT 266   Cardiac Enzymes: No results for input(s): CKTOTAL, CKMB, CKMBINDEX, TROPONINI in the last 168 hours.  BNP (last 3 results)  Recent Labs  02/21/15 1530  BNP 66.9    ProBNP (last 3 results) No results for input(s): PROBNP in the last 8760 hours.  CBG: No results for input(s): GLUCAP in the last 168 hours.  Radiological Exams on Admission: Dg Chest Port 1 View  05/13/2015  CLINICAL DATA:  Left shoulder pain and bilateral leg pain secondary to a fall today. EXAM: PORTABLE CHEST 1 VIEW COMPARISON:  02/21/2015 FINDINGS: There is a new small patchy infiltrate at the left lung base with slight atelectasis. Lungs are otherwise clear. Heart size and vascularity are normal. No appreciable osseous abnormality. IMPRESSION: Small focal area of infiltrate and atelectasis at the left lung base. This could represent a small focal area of aspiration pneumonitis. Electronically Signed   By: Francene Boyers M.D.   On: 05/13/2015 12:46   Dg Shoulder Left  05/13/2015  CLINICAL DATA:  Fall.  Arm pain EXAM: LEFT SHOULDER -  2+ VIEW COMPARISON:  None. FINDINGS: Nondisplaced fracture left humeral neck. Fracture of the greater tuberosity of the humerus. Normal alignment.  No other fracture identified. IMPRESSION: Nondisplaced fracture left humeral neck and greater tuberosity. Electronically Signed   By: Marlan Palau M.D.   On: 05/13/2015 11:06    EKG:   Assessment/Plan Principal Problem:   Acute respiratory failure (HCC) Active Problems:   S/P bilateral above knee amputation (HCC)   Tobacco abuse   Schizophrenia (HCC)   Chronic pain   Humerus fracture   HCAP (healthcare-associated pneumonia)   Acute encephalopathy   #1. Acute respiratory failure likely related to ammonia and/or possible narcotic overuse in the setting of COPD. Oxygen saturation level dropped 89% on room air upon presentation. He was provided with nebulizer and Solu-Medrol improved on admission -Admit to telemetry -Antibiotics per pneumonia protocol -Oxygen supplementation, wean as able -Blood culture, sputum culture -Legionella urine antigen, strep pneumo urine antigen -Nebulizers  #2. Healthcare associated  pneumonia. Chest x-ray with small focal area of infiltrate and atelectasis at left lung base. Patient in the hospital 2 months ago with similar presentation. Mild leukocytosis afebrile hemodynamically stable -See above -antibiotics as noted  3. Humerus fracture. As a result of fall today. Per x-ray -Pain management -Orthopedic consult  4. Chronic pain. Reports phantom pain. Reports attending pain clinic. Home medications include MS Contin 30 mg every 12. -We'll continue this -Provide analgesia for breakthrough  #5. Acute encephalopathy. Resolved at time of admission and after Narcan.. Likely related to narcotic overuse. Chart review indicates history of same. Denies feeling head at fall -We will obtain a folate B-12 -Weight results of rapid urine drug screen -Monitor  #6. Tobacco use -Cessation counseling offered  7.  Pain. Status post bilateral AKA with a long history of narcotic use -Alert on admission we'll resume home meds  Ortho consult requested per Ed MD  Code Status:  DVT Prophylaxis: Family Communication: none present Disposition Plan: back to shelter. Needs PCP and placement  Time spent: 65 minutes  [facility]    I have evaluated the patient, reviewed the chart, modified the above note and discussed the plan with Toya Smothers, NP. Has a congested cough which is worse than usual and a CXR suggestive of pneumonia. Continue Rocephin and Zithromax. Not wheezing and therefore no need for further steroids-she has received 125 mg of IV Solu-Medrol in the ER. Splint in place for left shoulder fracture. Most likely will need to go to skilled nursing facility as he is unable to now transition from bed to chair without the use of both of his arms (bilateral amputee). Will order knee sleeves for stump's. Currently states that he was NOT taking any narcotics at home. He had ran out of his MS contin about 1 month ago.    Calvert Cantor, MD Pager: [email or web address]

## 2015-05-13 NOTE — ED Notes (Signed)
Pt to CT

## 2015-05-13 NOTE — ED Notes (Signed)
Per EMS pt is bilateral LL amputee and was getting ride in disability bus, at [employer] driver heard a thud and saw patient supine on floor. Upon EMS arrival pt lethargic, pupils pin-point, verbally responsive to sternal rub, respirations 6-8. Given 1mg  of narcan with minimal response, 2nd 1 mg dose of narcan administered and patient became more responsive, respirations increased and no longer shallow, patient pupils 14mm bilaterally. Patient endorses chronic pain all over and more pain in left arm. No obvious deformity noted, patient able to move bilateral arms. Pt took off c-collar himself and refusing to wear for spinal protection.

## 2015-05-13 NOTE — Progress Notes (Signed)
Orthopedic Tech Progress Note Patient Details:  [name] [name] 08-18-1955 [id number]  Ortho Devices Type of Ortho Device: Knee Sleeve Ortho Device/Splint Location: bi lateral Ortho Device/Splint Interventions: Ordered, Application   Trinna Post 05/13/2015, 6:38 PM

## 2015-05-13 NOTE — ED Notes (Signed)
This RN made aware pt is allergic to Penicillin, based on their review of his medication history.  Patient stated no allergies to antibiotics when RN started.  Penicillin allergy from childhood.  Instructed by pharmacy to continue with Rocephin administration.  Monitoring patient.  Patient alert and oriented.  Denies any discomfort/swelling/itching.  Vitals stable.  Will continue to monitor.

## 2015-05-13 NOTE — ED Notes (Signed)
Pt provided with urinal and encouraged to urinate.  

## 2015-05-14 DIAGNOSIS — I4891 Unspecified atrial fibrillation: Secondary | ICD-10-CM

## 2015-05-14 DIAGNOSIS — J189 Pneumonia, unspecified organism: Principal | ICD-10-CM

## 2015-05-14 DIAGNOSIS — G9340 Encephalopathy, unspecified: Secondary | ICD-10-CM

## 2015-05-14 DIAGNOSIS — S42301A Unspecified fracture of shaft of humerus, right arm, initial encounter for closed fracture: Secondary | ICD-10-CM

## 2015-05-14 DIAGNOSIS — Z720 Tobacco use: Secondary | ICD-10-CM

## 2015-05-14 LAB — HEPATIC FUNCTION PANEL
ALT: 21 U/L (ref 17–63)
AST: 19 U/L (ref 15–41)
Albumin: 3.2 g/dL — ABNORMAL LOW (ref 3.5–5.0)
Alkaline Phosphatase: 81 U/L (ref 38–126)
BILIRUBIN DIRECT: 0.2 mg/dL (ref 0.1–0.5)
BILIRUBIN INDIRECT: 0.3 mg/dL (ref 0.3–0.9)
Total Bilirubin: 0.5 mg/dL (ref 0.3–1.2)
Total Protein: 6.6 g/dL (ref 6.5–8.1)

## 2015-05-14 LAB — MAGNESIUM: MAGNESIUM: 2 mg/dL (ref 1.7–2.4)

## 2015-05-14 LAB — COMPREHENSIVE METABOLIC PANEL
ALT: 20 U/L (ref 17–63)
AST: 19 U/L (ref 15–41)
Albumin: 3.2 g/dL — ABNORMAL LOW (ref 3.5–5.0)
Alkaline Phosphatase: 80 U/L (ref 38–126)
Anion gap: 8 (ref 5–15)
BUN: 10 mg/dL (ref 6–20)
CHLORIDE: 113 mmol/L — AB (ref 101–111)
CO2: 21 mmol/L — ABNORMAL LOW (ref 22–32)
CREATININE: 0.62 mg/dL (ref 0.61–1.24)
Calcium: 8.9 mg/dL (ref 8.9–10.3)
Glucose, Bld: 168 mg/dL — ABNORMAL HIGH (ref 65–99)
POTASSIUM: 4.2 mmol/L (ref 3.5–5.1)
Sodium: 142 mmol/L (ref 135–145)
TOTAL PROTEIN: 6.3 g/dL — AB (ref 6.5–8.1)
Total Bilirubin: 0.4 mg/dL (ref 0.3–1.2)

## 2015-05-14 LAB — CBC WITH DIFFERENTIAL/PLATELET
BASOS ABS: 0 10*3/uL (ref 0.0–0.1)
BASOS PCT: 0 %
EOS ABS: 0 10*3/uL (ref 0.0–0.7)
Eosinophils Relative: 0 %
HCT: 43.9 % (ref 39.0–52.0)
HEMOGLOBIN: 14.2 g/dL (ref 13.0–17.0)
LYMPHS ABS: 1.4 10*3/uL (ref 0.7–4.0)
Lymphocytes Relative: 9 %
MCH: 30.3 pg (ref 26.0–34.0)
MCHC: 32.3 g/dL (ref 30.0–36.0)
MCV: 93.8 fL (ref 78.0–100.0)
Monocytes Absolute: 0.5 10*3/uL (ref 0.1–1.0)
Monocytes Relative: 3 %
NEUTROS PCT: 88 %
Neutro Abs: 12.8 10*3/uL — ABNORMAL HIGH (ref 1.7–7.7)
Platelets: 263 10*3/uL (ref 150–400)
RBC: 4.68 MIL/uL (ref 4.22–5.81)
RDW: 15 % (ref 11.5–15.5)
WBC: 14.7 10*3/uL — AB (ref 4.0–10.5)

## 2015-05-14 LAB — LACTIC ACID, PLASMA
LACTIC ACID, VENOUS: 1.6 mmol/L (ref 0.5–2.0)
Lactic Acid, Venous: 1.9 mmol/L (ref 0.5–2.0)

## 2015-05-14 LAB — MRSA PCR SCREENING: MRSA BY PCR: POSITIVE — AB

## 2015-05-14 LAB — TSH: TSH: 0.204 u[IU]/mL — AB (ref 0.350–4.500)

## 2015-05-14 LAB — T4, FREE: Free T4: 0.96 ng/dL (ref 0.61–1.12)

## 2015-05-14 LAB — BRAIN NATRIURETIC PEPTIDE: B NATRIURETIC PEPTIDE 5: 140.7 pg/mL — AB (ref 0.0–100.0)

## 2015-05-14 MED ORDER — KETOROLAC TROMETHAMINE 15 MG/ML IJ SOLN
15.0000 mg | Freq: Once | INTRAMUSCULAR | Status: AC
  Administered 2015-05-14: 15 mg via INTRAVENOUS
  Filled 2015-05-14: qty 1

## 2015-05-14 MED ORDER — CARISOPRODOL 350 MG PO TABS
350.0000 mg | ORAL_TABLET | Freq: Three times a day (TID) | ORAL | Status: DC
  Administered 2015-05-14: 350 mg via ORAL
  Filled 2015-05-14: qty 1

## 2015-05-14 MED ORDER — MORPHINE SULFATE (PF) 2 MG/ML IV SOLN
1.0000 mg | Freq: Four times a day (QID) | INTRAVENOUS | Status: DC | PRN
  Administered 2015-05-14 – 2015-05-15 (×4): 2 mg via INTRAVENOUS
  Filled 2015-05-14 (×4): qty 1

## 2015-05-14 MED ORDER — METOPROLOL TARTRATE 1 MG/ML IV SOLN
5.0000 mg | Freq: Once | INTRAVENOUS | Status: AC
  Administered 2015-05-14: 5 mg via INTRAVENOUS

## 2015-05-14 MED ORDER — ENOXAPARIN SODIUM 40 MG/0.4ML ~~LOC~~ SOLN
40.0000 mg | SUBCUTANEOUS | Status: DC
  Administered 2015-05-14 – 2015-05-16 (×3): 40 mg via SUBCUTANEOUS
  Filled 2015-05-14 (×3): qty 0.4

## 2015-05-14 MED ORDER — IPRATROPIUM BROMIDE 0.02 % IN SOLN
0.5000 mg | RESPIRATORY_TRACT | Status: DC | PRN
  Administered 2015-05-14: 0.5 mg via RESPIRATORY_TRACT
  Filled 2015-05-14: qty 2.5

## 2015-05-14 MED ORDER — METOPROLOL TARTRATE 1 MG/ML IV SOLN
INTRAVENOUS | Status: AC
  Administered 2015-05-14: 5 mg via INTRAVENOUS
  Filled 2015-05-14: qty 5

## 2015-05-14 MED ORDER — AMIODARONE HCL IN DEXTROSE 360-4.14 MG/200ML-% IV SOLN
30.0000 mg/h | INTRAVENOUS | Status: DC
  Administered 2015-05-14 – 2015-05-15 (×2): 30 mg/h via INTRAVENOUS
  Filled 2015-05-14 (×2): qty 200

## 2015-05-14 MED ORDER — CARISOPRODOL 350 MG PO TABS
350.0000 mg | ORAL_TABLET | Freq: Three times a day (TID) | ORAL | Status: DC | PRN
  Administered 2015-05-14 – 2015-05-17 (×6): 350 mg via ORAL
  Filled 2015-05-14 (×6): qty 1

## 2015-05-14 MED ORDER — AMIODARONE HCL IN DEXTROSE 360-4.14 MG/200ML-% IV SOLN
60.0000 mg/h | INTRAVENOUS | Status: AC
  Administered 2015-05-14 (×2): 60 mg/h via INTRAVENOUS
  Filled 2015-05-14 (×2): qty 200

## 2015-05-14 MED ORDER — AMIODARONE LOAD VIA INFUSION
150.0000 mg | Freq: Once | INTRAVENOUS | Status: AC
  Administered 2015-05-14: 150 mg via INTRAVENOUS
  Filled 2015-05-14: qty 83.34

## 2015-05-14 MED ORDER — OXYCODONE HCL 5 MG PO TABS
5.0000 mg | ORAL_TABLET | Freq: Four times a day (QID) | ORAL | Status: DC | PRN
  Administered 2015-05-14 – 2015-05-15 (×4): 5 mg via ORAL
  Filled 2015-05-14 (×4): qty 1

## 2015-05-14 NOTE — Plan of Care (Signed)
Problem: Safety: Goal: Ability to remain free from injury will improve Outcome: Progressing Tolerates activity OOB. Utilizes call bell for needs. Bed Alarm.  Problem: Pain Managment: Goal: General experience of comfort will improve Outcome: Progressing Monitor Pain. Administer Pain Meds as ordered. Monitor for oversedation.

## 2015-05-14 NOTE — Progress Notes (Signed)
Subjective:  Complains of shoulder pain.  Quite animated and vocal this morning.  No complaints of chest pain.  Converted to sinus rhythm about 450 this morning on intravenous amiodarone  Objective:  Vital Signs in the last 24 hours: BP 112/80 mmHg  Pulse 91  Temp(Src) 98.3 F (36.8 C) (Oral)  Resp 26  Ht 4' (1.219 m)  Wt 83.961 kg (185 lb 1.6 oz)  BMI 56.50 kg/m2  SpO2 94%  Physical Exam:  Tattooed middle-aged male complaining of shoulder pain  Lungs:  Clear  Cardiac:  Regular rhythm, normal S1 and S2, no S3 Extremities:  No edema present  Intake/Output from previous day: 01/06 0701 - 01/07 0700 In: 694.4 [I.V.:411.1; IV Piggyback:283.3] Out: 700 [Urine:700] Weight Filed Weights   05/13/15 1822 05/14/15 0540  Weight: 84.1 kg (185 lb 6.5 oz) 83.961 kg (185 lb 1.6 oz)    Lab Results: Basic Metabolic Panel:  Recent Labs  20/35/[birth year] 1257 05/14/15 0448  NA 142 142  K 4.4 4.2  CL 109 113*  CO2 24 21*  GLUCOSE 152* 168*  BUN 11 10  CREATININE 0.70 0.62    CBC:  Recent Labs  05/13/15 1257 05/14/15 0448  WBC 14.2* 14.7*  NEUTROABS 10.0* 12.8*  HGB 13.9 14.2  HCT 42.6 43.9  MCV 93.8 93.8  PLT 266 263    BNP    Component Value Date/Time   BNP 140.7* 05/14/2015 0448   Telemetry: Currently sinus rhythm, atrial fibrillation with rapid response last night  Assessment/Plan:  1.  Paroxysmal atrial fibrillation with rapid response 2.  Hypertension 3.  Chronic pain syndrome with narcotic abuse 4.  Bilateral above-the-knee amputee 5.  Poor social situation  Recommendations:  Continue intravenous amiodarone today.  I would change him to oral amiodarone in the morning if he remains in sinus rhythm.  Check echocardiogram.  He would appear to me to be a poor candidate for anticoagulation due to his social situation.   Darden Palmer  MD [facility] Cardiology  05/14/2015, 9:40 AM

## 2015-05-14 NOTE — Progress Notes (Signed)
The client came to [address] west around 2300 in Afib RVR the heart rate was in the 160's -170's Cardizem was started. At 2330 the heart rate was in the 160's, [facility] was paged and asked for a bolus, to be given stepdown orders, and cardiology consult. Cardiology Azeem was called at 0000 heart rate was still in the 160's - 150's Lopressor IV was ordered. Azeem with Cardiology came to see the client at 0200 heart rate in the 150's Cardizem was stopped, amiodarone was ordered and some morning lab work. At 0450 the client converted from Afib to NSR in the 70's, an EKG was taken. Will continue to monitor the client closely.

## 2015-05-14 NOTE — Progress Notes (Addendum)
Patient Demographics  [name] [name], is a 60 y.o. male, DOB - Oct 25, 1955, ZOX:[id number]  Admit date - 05/13/2015   Admitting Physician Calvert Cantor, MD  Outpatient Primary MD for the patient is No primary care provider on file.  LOS - 1   Chief Complaint  Patient presents with  . Fall       Admission HPI/Brief narrative: 60 yo man with Schizophrenia, chronic pain, HTN, bilateral AKA, hepatitis C, homeless,  admitted with altered mental status, fall, left humeral fracture , workup was significant for pneumonia , as well patient developed A. fib with RVR during hospital stay.  Subjective:   [name] today has, No headache, No chest pain, No abdominal pain - No Nausea, patient complains of left shoulder pain. Assessment & Plan    Principal Problem:   Acute respiratory failure (HCC) Active Problems:   S/P bilateral above knee amputation (HCC)   Tobacco abuse   Schizophrenia (HCC)   Chronic pain   Humerus fracture   HCAP (healthcare-associated pneumonia)   Acute encephalopathy  Acute encephalopathy: - this is most likely related to pneumonia and benzodiazepine(as urine drug screen positive for benzodiazepine/patient is on soma) - Resolved, back to baseline  A. fib with RVR - Cardiology consult appreciated, initially on Cardizem drip, with poor control, converted to amiodarone drip, currently converted back to normal sinus rhythm, will continue with amiodarone drip today, patient is poor candidate for anticoagulation giving his social situation. - Patient with low TSH level, will check free T4  Pneumonia - Chest x-ray with small focal area at left lung base, patient with low-grade temperature, leukocytosis. - Continue with IV Rocephin and azithromycin  Humerus fracture - Orthopedic consult greatly appreciated, continue with sling and when necessary pain medication  Tobacco  abuse - Counseled  Code Status: Full  Family Communication: Discussed with patient  Disposition Plan: PT recommending SNF   Procedures  None   Consults   Cardiology Orthopedic   Medications  Scheduled Meds: . azithromycin  500 mg Oral Q24H  . cefTRIAXone (ROCEPHIN)  IV  1 g Intravenous Once  . cefTRIAXone (ROCEPHIN)  IV  1 g Intravenous Q24H  . enoxaparin (LOVENOX) injection  30 mg Subcutaneous Q24H  . folic acid  1 mg Oral Daily  . LORazepam  0-4 mg Intravenous Q6H   Followed by  . [START ON 05/15/2015] LORazepam  0-4 mg Intravenous Q12H  . multivitamin with minerals  1 tablet Oral Daily  . thiamine  100 mg Oral Daily   Or  . thiamine  100 mg Intravenous Daily   Continuous Infusions: . sodium chloride 75 mL/hr (05/13/15 2302)  . amiodarone 30 mg/hr (05/14/15 0835)   PRN Meds:.ALPRAZolam, bisacodyl, ipratropium, LORazepam **OR** LORazepam, morphine injection, ondansetron **OR** ondansetron (ZOFRAN) IV, oxyCODONE, polyethylene glycol  DVT Prophylaxis  Lovenox   Lab Results  Component Value Date   PLT 263 05/14/2015    Antibiotics    Anti-infectives    Start     Dose/Rate Route Frequency Ordered Stop   05/14/15 1800  azithromycin (ZITHROMAX) tablet 500 mg     500 mg Oral Every 24 hours 05/13/15 1816 05/20/15 1759   05/14/15 1600  cefTRIAXone (ROCEPHIN) 1 g in dextrose 5 % 50 mL IVPB  1 g 100 mL/hr over 30 Minutes Intravenous Every 24 hours 05/13/15 1816 05/20/15 1559   05/13/15 1445  azithromycin (ZITHROMAX) 500 mg in dextrose 5 % 250 mL IVPB     500 mg 250 mL/hr over 60 Minutes Intravenous  Once 05/13/15 1431 05/13/15 2300   05/13/15 1445  cefTRIAXone (ROCEPHIN) 1 g in dextrose 5 % 50 mL IVPB     1 g 100 mL/hr over 30 Minutes Intravenous  Once 05/13/15 1431            Objective:   Filed Vitals:   05/14/15 0521 05/14/15 0535 05/14/15 0540 05/14/15 0617  BP: 103/81 112/80  111/79  Pulse:      Temp:   98.3 F (36.8 C)   TempSrc:  Oral Oral    Resp: 26     Height:      Weight:   83.961 kg (185 lb 1.6 oz)   SpO2: 94% 94%      Wt Readings from Last 3 Encounters:  05/14/15 83.961 kg (185 lb 1.6 oz)  02/21/15 80.4 kg (177 lb 4 oz)     Intake/Output Summary (Last 24 hours) at 05/14/15 1200 Last data filed at 05/14/15 0900  Gross per 24 hour  Intake 1094.41 ml  Output    700 ml  Net 394.41 ml     Physical Exam  Awake Alert, Oriented X 3, No new F.N deficits, Normal affect [place].AT,PERRAL Supple Neck,No JVD, No cervical lymphadenopathy appriciated.  Symmetrical Chest wall movement, Good air movement bilaterally, CTAB RRR,No Gallops,Rubs or new Murmurs, No Parasternal Heave +ve B.Sounds, Abd Soft, No tenderness, No organomegaly appriciated, No rebound - guarding or rigidity. No Cyanosis, bilateral BKA   Data Review   Micro Results Recent Results (from the past 240 hour(s))  Culture, blood (Routine X 2) w Reflex to ID Panel     Status: None (Preliminary result)   Collection Time: 05/13/15  3:32 PM  Result Value Ref Range Status   Specimen Description BLOOD RIGHT ANTECUBITAL  Final   Special Requests BOTTLES DRAWN AEROBIC AND ANAEROBIC 5CC  Final   Culture NO GROWTH < 24 HOURS  Final   Report Status PENDING  Incomplete  Culture, blood (Routine X 2) w Reflex to ID Panel     Status: None (Preliminary result)   Collection Time: 05/13/15  3:38 PM  Result Value Ref Range Status   Specimen Description BLOOD RIGHT HAND  Final   Special Requests BOTTLES DRAWN AEROBIC AND ANAEROBIC 5CC  Final   Culture NO GROWTH < 24 HOURS  Final   Report Status PENDING  Incomplete  MRSA PCR Screening     Status: Abnormal   Collection Time: 05/14/15 12:20 AM  Result Value Ref Range Status   MRSA by PCR POSITIVE (A) NEGATIVE Final    Comment:        The GeneXpert MRSA Assay (FDA approved for NASAL specimens only), is one component of a comprehensive MRSA colonization surveillance program. It is not intended to diagnose  MRSA infection nor to guide or monitor treatment for MRSA infections. RESULT CALLED TO, READ BACK BY AND VERIFIED WITH: T AYERS,RN  05/14/15 [facility]     Radiology Reports Ct Shoulder Left Wo Contrast  05/13/2015  CLINICAL DATA:  Larey Seat.  Injured left shoulder. EXAM: CT OF THE LEFT SHOULDER WITHOUT CONTRAST TECHNIQUE: Multidetector CT imaging was performed according to the standard protocol. Multiplanar CT image reconstructions were also generated. COMPARISON:  Radiographs 05/13/2015 FINDINGS: There is a common to  did but relatively nondisplaced fracture of the greater tuberosity. No definite transverse fracture through the humeral neck the lesser tuberosity is intact. The glenoid is intact. The [facility] joint is intact. No clavicle fracture subchondral cystic changes noted in the humeral head. The rotator cuff tendons are grossly normal. The left ribs are intact. The visualized left lung demonstrates left basilar atelectasis. No worrisome pulmonary lesions. IMPRESSION: 1. Comminuted but relatively nondisplaced greater tuberosity fracture. No definite humeral neck fracture. 2. No scapular or clavicle fracture. 3. Left lower lobe atelectasis but no worrisome lung lesions. Electronically Signed   By: Rudie Meyer M.D.   On: 05/13/2015 16:43   Dg Chest Port 1 View  05/13/2015  CLINICAL DATA:  Left shoulder pain and bilateral leg pain secondary to a fall today. EXAM: PORTABLE CHEST 1 VIEW COMPARISON:  02/21/2015 FINDINGS: There is a new small patchy infiltrate at the left lung base with slight atelectasis. Lungs are otherwise clear. Heart size and vascularity are normal. No appreciable osseous abnormality. IMPRESSION: Small focal area of infiltrate and atelectasis at the left lung base. This could represent a small focal area of aspiration pneumonitis. Electronically Signed   By: Francene Boyers M.D.   On: 05/13/2015 12:46   Dg Shoulder Left  05/13/2015  CLINICAL DATA:  Fall.  Arm pain EXAM: LEFT SHOULDER - 2+  VIEW COMPARISON:  None. FINDINGS: Nondisplaced fracture left humeral neck. Fracture of the greater tuberosity of the humerus. Normal alignment.  No other fracture identified. IMPRESSION: Nondisplaced fracture left humeral neck and greater tuberosity. Electronically Signed   By: Marlan Palau M.D.   On: 05/13/2015 11:06     CBC  Recent Labs Lab 05/13/15 1257 05/14/15 0448  WBC 14.2* 14.7*  HGB 13.9 14.2  HCT 42.6 43.9  PLT 266 263  MCV 93.8 93.8  MCH 30.6 30.3  MCHC 32.6 32.3  RDW 14.8 15.0  LYMPHSABS 3.0 1.4  MONOABS 0.9 0.5  EOSABS 0.2 0.0  BASOSABS 0.0 0.0    Chemistries   Recent Labs Lab 05/13/15 1257 05/14/15 0314 05/14/15 0448  NA 142  --  142  K 4.4  --  4.2  CL 109  --  113*  CO2 24  --  21*  GLUCOSE 152*  --  168*  BUN 11  --  10  CREATININE 0.70  --  0.62  CALCIUM 8.9  --  8.9  MG  --   --  2.0  AST  --  19 19  ALT  --  21 20  ALKPHOS  --  81 80  BILITOT  --  0.5 0.4   ------------------------------------------------------------------------------------------------------------------ estimated creatinine clearance is 66.9 mL/min (by C-G formula based on Cr of 0.62). ------------------------------------------------------------------------------------------------------------------ No results for input(s): HGBA1C in the last 72 hours. ------------------------------------------------------------------------------------------------------------------ No results for input(s): CHOL, HDL, LDLCALC, TRIG, CHOLHDL, LDLDIRECT in the last 72 hours. ------------------------------------------------------------------------------------------------------------------  Recent Labs  05/14/15 0314  TSH 0.204*   ------------------------------------------------------------------------------------------------------------------  Recent Labs  05/13/15 1945  VITAMINB12 473    Coagulation profile No results for input(s): INR, PROTIME in the last 168 hours.  No results  for input(s): DDIMER in the last 72 hours.  Cardiac Enzymes No results for input(s): CKMB, TROPONINI, MYOGLOBIN in the last 168 hours.  Invalid input(s): CK ------------------------------------------------------------------------------------------------------------------ Invalid input(s): POCBNP     Time Spent in minutes   30 minutes   Emeterio Balke M.D on 05/14/2015 at 12:00 PM  Between 7am to 7pm - Pager - [phone]  After 7pm go  to [email or web address] - password [facility]  [facility]   Office  [phone]

## 2015-05-14 NOTE — [place] FL2 (Signed)
MEDICAID FL2 LEVEL OF CARE SCREENING TOOL     IDENTIFICATION  Patient Name: [name] [name] Birthdate: Mar 30, 1956 Sex: male Admission Date (Current Location): 05/13/2015  [facility] [place] Number:   [facility])   Facility and Address:  [facility] [place], [address] [place], [place] [address]      Provider Number: [id number]  Attending Physician Name and Address:  Starleen Arms, MD  Relative Name and Phone Number:       Current Level of Care: Hospital Recommended Level of Care: Skilled Nursing Facility Prior Approval Number:    Date Approved/Denied:   PASRR Number:    Discharge Plan: SNF    Current Diagnoses: Patient Active Problem List   Diagnosis Date Noted  . Humerus fracture 05/13/2015  . HCAP (healthcare-associated pneumonia) 05/13/2015  . Acute encephalopathy 05/13/2015  . Adjustment disorder with mixed anxiety and depressed mood 02/22/2015  . Narcotic overdose 02/21/2015  . Acute respiratory failure (HCC) 02/21/2015  . Opioid overdose 02/21/2015  . Schizophrenia (HCC) 02/21/2015  . Chronic pain 02/21/2015  . Prolonged Q-T interval on ECG 02/21/2015  . S/P bilateral above knee amputation (HCC) 11/24/2013  . Atrial fibrillation (HCC) 11/24/2013  . Paroxysmal supraventricular tachycardia (HCC) 11/24/2013  . Pseudomembranous colitis 11/24/2013  . Seizure disorder (HCC) 11/24/2013  . Tobacco abuse 11/24/2013    Orientation RESPIRATION BLADDER Height & Weight    Self, Time, Place  Normal Incontinent 4' (121.9 cm) 185 lbs.  BEHAVIORAL SYMPTOMS/MOOD NEUROLOGICAL BOWEL NUTRITION STATUS      Continent Diet (Heart Healthy)  AMBULATORY STATUS COMMUNICATION OF NEEDS Skin   Extensive Assist (Patient is wheelchair bound at baseline) Verbally Normal                       Personal Care Assistance Level of Assistance  Bathing, Feeding, Dressing Bathing Assistance: Limited assistance Feeding assistance: Limited  assistance Dressing Assistance: Limited assistance     Functional Limitations Info  Sight, Hearing, Speech Sight Info: Adequate Hearing Info: Adequate Speech Info: Adequate    SPECIAL CARE FACTORS FREQUENCY  PT (By licensed PT), OT (By licensed OT)     PT Frequency: 2 OT Frequency: 2            Contractures Contractures Info: Not present    Additional Factors Info  Code Status, Allergies, Psychotropic Code Status Info: Full Allergies Info: Penicillins, Prednisone, Acetaminophen, Trazodone, Latex, Other Psychotropic Info: Ativan         Current Medications (05/14/2015):  This is the current hospital active medication list Current Facility-Administered Medications  Medication Dose Route Frequency Provider Last Rate Last Dose  . 0.9 %  sodium chloride infusion   Intravenous Continuous Gwenyth Bender, NP 75 mL/hr at 05/13/15 2302 75 mL/hr at 05/13/15 2302  . ALPRAZolam Prudy Feeler) tablet 1 mg  1 mg Oral BID PRN Gwenyth Bender, NP   1 mg at 05/14/15 1049  . amiodarone (NEXTERONE PREMIX) 360 MG/200ML (1.8 mg/mL) IV infusion  30 mg/hr Intravenous Continuous Nevin Bloodgood, MD 16.7 mL/hr at 05/14/15 0835 30 mg/hr at 05/14/15 0835  . azithromycin (ZITHROMAX) tablet 500 mg  500 mg Oral Q24H Lesle Chris Black, NP      . bisacodyl (DULCOLAX) suppository 10 mg  10 mg Rectal Daily PRN Gwenyth Bender, NP      . carisoprodol (SOMA) tablet 350 mg  350 mg Oral TID Starleen Arms, MD   350 mg at 05/14/15 1255  . cefTRIAXone (  ROCEPHIN) 1 g in dextrose 5 % 50 mL IVPB  1 g Intravenous Once Rolland Porter, MD   Stopped at 05/13/15 1639  . cefTRIAXone (ROCEPHIN) 1 g in dextrose 5 % 50 mL IVPB  1 g Intravenous Q24H Lesle Chris Black, NP      . enoxaparin (LOVENOX) injection 30 mg  30 mg Subcutaneous Q24H Gwenyth Bender, NP   30 mg at 05/13/15 1843  . folic acid (FOLVITE) tablet 1 mg  1 mg Oral Daily Rolan Lipa, NP   1 mg at 05/14/15 0925  . ipratropium (ATROVENT) nebulizer solution 0.5 mg  0.5 mg  Nebulization Q4H PRN Nevin Bloodgood, MD   0.5 mg at 05/14/15 0230  . LORazepam (ATIVAN) injection 0-4 mg  0-4 mg Intravenous Q6H Rolan Lipa, NP   2 mg at 05/14/15 0403   Followed by  . [START ON 05/15/2015] LORazepam (ATIVAN) injection 0-4 mg  0-4 mg Intravenous Q12H Rolan Lipa, NP      . LORazepam (ATIVAN) tablet 1 mg  1 mg Oral Q6H PRN Rolan Lipa, NP       Or  . LORazepam (ATIVAN) injection 1 mg  1 mg Intravenous Q6H PRN Rolan Lipa, NP   1 mg at 05/14/15 1322  . morphine 2 MG/ML injection 1-2 mg  1-2 mg Intravenous Q6H PRN Starleen Arms, MD   2 mg at 05/14/15 0924  . multivitamin with minerals tablet 1 tablet  1 tablet Oral Daily Rolan Lipa, NP   1 tablet at 05/14/15 0926  . ondansetron (ZOFRAN) tablet 4 mg  4 mg Oral Q6H PRN Gwenyth Bender, NP       Or  . ondansetron [facility]) injection 4 mg  4 mg Intravenous Q6H PRN Gwenyth Bender, NP      . oxyCODONE (Oxy IR/ROXICODONE) immediate release tablet 5 mg  5 mg Oral Q6H PRN Starleen Arms, MD   5 mg at 05/14/15 1049  . polyethylene glycol (MIRALAX / GLYCOLAX) packet 17 g  17 g Oral Daily PRN Gwenyth Bender, NP      . thiamine (VITAMIN B-1) tablet 100 mg  100 mg Oral Daily Rolan Lipa, NP   100 mg at 05/14/15 6962   Or  . thiamine (B-1) injection 100 mg  100 mg Intravenous Daily Rolan Lipa, NP   100 mg at 05/13/15 2332     Discharge Medications: Please see discharge summary for a list of discharge medications.  Relevant Imaging Results:  Relevant Lab Results:   Additional Information    Macario Golds, [place] [phone]

## 2015-05-14 NOTE — Consult Note (Signed)
Referring Physician: Hospitalist   Reason for Consultation: AF with RVR  HPI: 60 yo man with Schizophrenia, chronic pain, HTN, bilateral AKA, reportedly homeless, history of alcohol abuse and opioid dependence, admitted with altered mental status, fall, left humeral fracture and possible pneumonia. While on the inpatient service, developed AF with RVR (V rate over 160 bpm). Attempts to control with Dilt infusion up to 15 mg/hr and Lopressor 5 mg IV x2 remained unsuccessful in controlling rate. There is also concern fo possible withdrawal from alcohol and opioids. No known CAD, HF, CVA.     Review of Systems:  Not obtainable as very drowsy   Past Medical History  Diagnosis Date  . OCD (obsessive compulsive disorder)   . Schizophrenia (HCC)   . MI (myocardial infarction) (HCC)   . HTN (hypertension)   . AKA stump complication (HCC)   . Chronic pain   . Narcotic abuse     Medications Prior to Admission  Medication Sig Dispense Refill  . ALPRAZolam (XANAX) 1 MG tablet Take 1 tablet (1 mg total) by mouth 2 (two) times daily as needed for anxiety. 30 tablet 0  . diphenhydramine-acetaminophen (TYLENOL PM) 25-500 MG TABS tablet Take 0.5 tablets by mouth at bedtime as needed.       Marland Kitchen amiodarone  150 mg Intravenous Once  . azithromycin  500 mg Oral Q24H  . cefTRIAXone (ROCEPHIN)  IV  1 g Intravenous Once  . cefTRIAXone (ROCEPHIN)  IV  1 g Intravenous Q24H  . enoxaparin (LOVENOX) injection  30 mg Subcutaneous Q24H  . folic acid  1 mg Oral Daily  . LORazepam  0-4 mg Intravenous Q6H   Followed by  . [START ON 05/15/2015] LORazepam  0-4 mg Intravenous Q12H  . multivitamin with minerals  1 tablet Oral Daily  . thiamine  100 mg Oral Daily   Or  . thiamine  100 mg Intravenous Daily    Infusions: . sodium chloride 75 mL/hr (05/13/15 2302)  . amiodarone     Followed by  . amiodarone      Allergies  Allergen Reactions  . Penicillins Anaphylaxis    Tolerated cefepime and  ceftriaxone  Has patient had a PCN reaction causing immediate rash, facial/tongue/throat swelling, SOB or lightheadedness with hypotension: Yes Has patient had a PCN reaction causing severe rash involving mucus membranes or skin necrosis: No Has patient had a PCN reaction that required hospitalization No Has patient had a PCN reaction occurring within the last 10 years: No If all of the above answers are "NO", then may proceed with Cephalosporin use.  . Prednisone Anaphylaxis  . Acetaminophen Other (See Comments)    Affects liver  . Trazodone Other (See Comments)    Made eyes red   . Latex Rash  . Other Rash    Plastic    Social History   Social History  . Marital Status: Single    Spouse Name: N/A  . Number of Children: N/A  . Years of Education: N/A   Occupational History  . Not on file.   Social History Main Topics  . Smoking status: Current Every Day Smoker -- 0.50 packs/day    Types: Cigarettes  . Smokeless tobacco: Never Used  . Alcohol Use: No  . Drug Use: No  . Sexual Activity: Not on file   Other Topics Concern  . Not on file   Social History Narrative    Family History  Problem Relation Age of Onset  . Hypertension Mother   .  Hypertension Father   . Diabetes Mother   . Diabetes Father     PHYSICAL EXAM: Filed Vitals:   05/14/15 0105 05/14/15 0123  BP: 112/81 104/68  Pulse:    Temp:    Resp: 31 35     Intake/Output Summary (Last 24 hours) at 05/14/15 0205 Last data filed at 05/14/15 0000  Gross per 24 hour  Intake 694.41 ml  Output      0 ml  Net 694.41 ml    General:  Disheveled, drowsy and difficult to be aroused HEENT: AT, PERL Neck: supple. no JVD. Carotids 2+ bilat; no bruits. No lymphadenopathy or thryomegaly appreciated. Cor: PMI nondisplaced. Regular rate & rhythm. No rubs, gallops or murmurs. Lungs: scattered ronchi and crackles in both LL Abdomen: soft, nontender, nondistended. No hepatosplenomegaly. No bruits or masses. Good  bowel sounds. Extremities: s/p bilateral AKA Neuro: drowsy, not following commands  ECG: AF with RVR V rate over 160 bpm, narrow QRS    Results for orders placed or performed during the hospital encounter of 05/13/15 (from the past 24 hour(s))  CBC with Differential/Platelet     Status: Abnormal   Collection Time: 05/13/15 12:57 PM  Result Value Ref Range   WBC 14.2 (H) 4.0 - 10.5 K/uL   RBC 4.54 4.22 - 5.81 MIL/uL   Hemoglobin 13.9 13.0 - 17.0 g/dL   HCT 42.5 95.6 - 38.7 %   MCV 93.8 78.0 - 100.0 fL   MCH 30.6 26.0 - 34.0 pg   MCHC 32.6 30.0 - 36.0 g/dL   RDW 56.4 33.2 - 95.1 %   Platelets 266 150 - 400 K/uL   Neutrophils Relative % 71 %   Neutro Abs 10.0 (H) 1.7 - 7.7 K/uL   Lymphocytes Relative 21 %   Lymphs Abs 3.0 0.7 - 4.0 K/uL   Monocytes Relative 6 %   Monocytes Absolute 0.9 0.1 - 1.0 K/uL   Eosinophils Relative 2 %   Eosinophils Absolute 0.2 0.0 - 0.7 K/uL   Basophils Relative 0 %   Basophils Absolute 0.0 0.0 - 0.1 K/uL  Basic metabolic panel     Status: Abnormal   Collection Time: 05/13/15 12:57 PM  Result Value Ref Range   Sodium 142 135 - 145 mmol/L   Potassium 4.4 3.5 - 5.1 mmol/L   Chloride 109 101 - 111 mmol/L   CO2 24 22 - 32 mmol/L   Glucose, Bld 152 (H) 65 - 99 mg/dL   BUN 11 6 - 20 mg/dL   Creatinine, Ser 8.84 0.61 - 1.24 mg/dL   Calcium 8.9 8.9 - 16.6 mg/dL   GFR calc non Af Amer >60 >60 mL/min   GFR calc Af Amer >60 >60 mL/min   Anion gap 9 5 - 15  Urine rapid drug screen (hosp performed)     Status: Abnormal   Collection Time: 05/13/15  6:06 PM  Result Value Ref Range   Opiates NONE DETECTED NONE DETECTED   Cocaine NONE DETECTED NONE DETECTED   Benzodiazepines POSITIVE (A) NONE DETECTED   Amphetamines NONE DETECTED NONE DETECTED   Tetrahydrocannabinol NONE DETECTED NONE DETECTED   Barbiturates NONE DETECTED NONE DETECTED  Strep pneumoniae urinary antigen     Status: None   Collection Time: 05/13/15  6:19 PM  Result Value Ref Range   Strep  Pneumo Urinary Antigen NEGATIVE NEGATIVE  Vitamin B12     Status: None   Collection Time: 05/13/15  7:45 PM  Result Value Ref Range   Vitamin  B-12 473 180 - 914 pg/mL   Ct Shoulder Left Wo Contrast  05/13/2015  CLINICAL DATA:  Larey Seat.  Injured left shoulder. EXAM: CT OF THE LEFT SHOULDER WITHOUT CONTRAST TECHNIQUE: Multidetector CT imaging was performed according to the standard protocol. Multiplanar CT image reconstructions were also generated. COMPARISON:  Radiographs 05/13/2015 FINDINGS: There is a common to did but relatively nondisplaced fracture of the greater tuberosity. No definite transverse fracture through the humeral neck the lesser tuberosity is intact. The glenoid is intact. The [place] joint is intact. No clavicle fracture subchondral cystic changes noted in the humeral head. The rotator cuff tendons are grossly normal. The left ribs are intact. The visualized left lung demonstrates left basilar atelectasis. No worrisome pulmonary lesions. IMPRESSION: 1. Comminuted but relatively nondisplaced greater tuberosity fracture. No definite humeral neck fracture. 2. No scapular or clavicle fracture. 3. Left lower lobe atelectasis but no worrisome lung lesions. Electronically Signed   By: Rudie Meyer M.D.   On: 05/13/2015 16:43   Dg Chest Port 1 View  05/13/2015  CLINICAL DATA:  Left shoulder pain and bilateral leg pain secondary to a fall today. EXAM: PORTABLE CHEST 1 VIEW COMPARISON:  02/21/2015 FINDINGS: There is a new small patchy infiltrate at the left lung base with slight atelectasis. Lungs are otherwise clear. Heart size and vascularity are normal. No appreciable osseous abnormality. IMPRESSION: Small focal area of infiltrate and atelectasis at the left lung base. This could represent a small focal area of aspiration pneumonitis. Electronically Signed   By: Francene Boyers M.D.   On: 05/13/2015 12:46   Dg Shoulder Left  05/13/2015  CLINICAL DATA:  Fall.  Arm pain EXAM: LEFT SHOULDER - 2+ VIEW  COMPARISON:  None. FINDINGS: Nondisplaced fracture left humeral neck. Fracture of the greater tuberosity of the humerus. Normal alignment.  No other fracture identified. IMPRESSION: Nondisplaced fracture left humeral neck and greater tuberosity. Electronically Signed   By: Marlan Palau M.D.   On: 05/13/2015 11:06     ASSESSMENT:  1. AF with RVR (known duration of less than 6 hours) in the setting of possible pneumonia, SIRS/sepsis, alcohol withdrawal, fall and left humerus fracture - Dilt infusion up to 15 mg/h and Lopressor 5 mg IV x2 did not result in much improvement in HR at the cost of lowering of SBP down to 90's - Poor medical compliance and social set up (he is homeless) will make outpatient management, especially any anticoagulation regimen, very challenging     PLAN/DISCUSSION:  Will try amiodarone  Echo  TSH and hepatic function  Avoid beta agonist May consider electrical cardioversion but anticoagulation post DCCV will be a challenge.   Thanks for  Consult. Cardiology will follow.    Nevin Bloodgood, MD Cardiology

## 2015-05-14 NOTE — Progress Notes (Signed)
   Subjective:     Recheck left shoulder s/p injury yesterday Pt unsure of his injury Admitted to stepdown unit due to heart rate C/o mild to moderate pain in the left arm/shoulder Denies any numbness or tingling distally  Patient reports pain as mild.  Objective:   VITALS:   Filed Vitals:   05/14/15 0535 05/14/15 0540  BP: 112/80   Pulse:    Temp:  98.3 F (36.8 C)  Resp:      Left shoulder with minimal edema No ecchymosis Not taken thru any rom due to known fracture nv intact distally  LABS  Recent Labs  05/13/15 1257 05/14/15 0448  HGB 13.9 14.2  HCT 42.6 43.9  WBC 14.2* 14.7*  PLT 266 263     Recent Labs  05/13/15 1257 05/14/15 0448  NA 142 142  K 4.4 4.2  BUN 11 10  CREATININE 0.70 0.62  GLUCOSE 152* 168*     Assessment/Plan:    Left greater tuberosity fracture Reviewed plain films and CT scan showing greater tuberosity fracture with minimal displacement Due to the lack of displacement surgery is not indicated at this time Recommend sling and pain management as needed Will continue to monitor his progress but he may f/u as an outpatient Recommend limited use of the left upper extremity with no lifting  Alphonsa Overall, MPAS, PA-C  05/14/2015, 7:58 AM

## 2015-05-14 NOTE — Progress Notes (Signed)
Orthopedic Tech Progress Note Patient Details:  [name] [name] 11-Mar-1956 [id number]  Ortho Devices Type of Ortho Device: Sling immobilizer Ortho Device/Splint Location: lue Ortho Device/Splint Interventions: Application   [name] [name] 05/14/2015, 10:01 AM
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
## 2015-05-15 ENCOUNTER — Inpatient Hospital Stay (HOSPITAL_COMMUNITY): Payer: Medicare Other

## 2015-05-15 DIAGNOSIS — S42302S Unspecified fracture of shaft of humerus, left arm, sequela: Secondary | ICD-10-CM

## 2015-05-15 DIAGNOSIS — I4891 Unspecified atrial fibrillation: Secondary | ICD-10-CM

## 2015-05-15 LAB — BASIC METABOLIC PANEL
ANION GAP: 7 (ref 5–15)
BUN: 13 mg/dL (ref 6–20)
CO2: 25 mmol/L (ref 22–32)
Calcium: 8.5 mg/dL — ABNORMAL LOW (ref 8.9–10.3)
Chloride: 108 mmol/L (ref 101–111)
Creatinine, Ser: 0.64 mg/dL (ref 0.61–1.24)
GLUCOSE: 95 mg/dL (ref 65–99)
POTASSIUM: 4 mmol/L (ref 3.5–5.1)
SODIUM: 140 mmol/L (ref 135–145)

## 2015-05-15 LAB — CBC
HCT: 40.2 % (ref 39.0–52.0)
HEMOGLOBIN: 13 g/dL (ref 13.0–17.0)
MCH: 30.6 pg (ref 26.0–34.0)
MCHC: 32.3 g/dL (ref 30.0–36.0)
MCV: 94.6 fL (ref 78.0–100.0)
PLATELETS: 245 10*3/uL (ref 150–400)
RBC: 4.25 MIL/uL (ref 4.22–5.81)
RDW: 15.1 % (ref 11.5–15.5)
WBC: 10.6 10*3/uL — AB (ref 4.0–10.5)

## 2015-05-15 MED ORDER — DILTIAZEM HCL 100 MG IV SOLR
5.0000 mg/h | INTRAVENOUS | Status: DC
  Administered 2015-05-15: 5 mg/h via INTRAVENOUS
  Administered 2015-05-16: 10 mg/h via INTRAVENOUS
  Administered 2015-05-17: 5 mg/h via INTRAVENOUS
  Filled 2015-05-15 (×3): qty 100

## 2015-05-15 MED ORDER — DILTIAZEM HCL 25 MG/5ML IV SOLN
10.0000 mg | Freq: Once | INTRAVENOUS | Status: AC
  Administered 2015-05-15: 10 mg via INTRAVENOUS
  Filled 2015-05-15: qty 5

## 2015-05-15 MED ORDER — AMIODARONE HCL 200 MG PO TABS
200.0000 mg | ORAL_TABLET | Freq: Two times a day (BID) | ORAL | Status: DC
  Administered 2015-05-15 – 2015-05-16 (×3): 200 mg via ORAL
  Filled 2015-05-15 (×3): qty 1

## 2015-05-15 MED ORDER — OXYCODONE HCL 5 MG PO TABS
5.0000 mg | ORAL_TABLET | ORAL | Status: DC | PRN
  Administered 2015-05-15 – 2015-05-17 (×9): 10 mg via ORAL
  Filled 2015-05-15 (×10): qty 2

## 2015-05-15 MED ORDER — MORPHINE SULFATE (PF) 2 MG/ML IV SOLN
1.0000 mg | INTRAVENOUS | Status: DC | PRN
  Administered 2015-05-15 – 2015-05-17 (×10): 2 mg via INTRAVENOUS
  Filled 2015-05-15 (×11): qty 1

## 2015-05-15 NOTE — Progress Notes (Signed)
New IV attempted x 2 without success. IV team consult placed.

## 2015-05-15 NOTE — Plan of Care (Signed)
Problem: Education: Goal: Knowledge of [facility] Education information/materials will improve Outcome: Progressing RN discussed with patient the use and benefits of the sling for his left upper extremity.  Patient stated understanding.  RN adjusted the sling to better support patient.  Later patient took medicine cup from RN into his left hand and then proceeded to lift left arm to take medicine cup to his mouth to dump the pill(s) in and patient cried out in pain.  RN explained to patient that he isn't supposed to be using his left arm/hand for anything, that it should just be resting in the sling.  Patient stated understanding again.

## 2015-05-15 NOTE — Progress Notes (Signed)
  Echocardiogram 2D Echocardiogram has been performed.  Arvil Chaco 05/15/2015, 10:27 AM

## 2015-05-15 NOTE — Progress Notes (Signed)
Patient Demographics  [name] [name], is a 60 y.o. male, DOB - 10-22-1955, MLJ:[id number]  Admit date - 05/13/2015   Admitting Physician Calvert Cantor, MD  Outpatient Primary MD for the patient is No primary care provider on file.  LOS - 2   Chief Complaint  Patient presents with  . Fall       Admission HPI/Brief narrative: 60 yo man with Schizophrenia, chronic pain, HTN, bilateral AKA, hepatitis C, homeless,  admitted with altered mental status, fall, left humeral fracture , workup was significant for pneumonia , as well patient developed A. fib with RVR during hospital stay, initially on Cardizem drip, with poor control, transitioned to amiodarone drip,Converted to normal sinus rhythm,currently on by mouth amiodarone, as well patient diagnosed with pneumonia.  Subjective:   [name] [name] today has, No headache, No chest pain, No abdominal pain - No Nausea, patient complains of left shoulder pain. Assessment & Plan    Principal Problem:   Acute respiratory failure (HCC) Active Problems:   S/P bilateral above knee amputation (HCC)   Tobacco abuse   Schizophrenia (HCC)   Chronic pain   Humerus fracture   HCAP (healthcare-associated pneumonia)   Acute encephalopathy  Acute encephalopathy: - this is most likely related to pneumonia and benzodiazepine(as urine drug screen positive for benzodiazepine/patient is on soma) - Resolved, back to baseline  A. fib with RVR - Cardiology consult appreciated, initially on Cardizem drip, with poor control, converted to amiodarone drip, currently converted back to normal sinus rhythm,transitioned to by mouth amiodarone today , patient is poor candidate for anticoagulation giving his social situation. - Patient with low TSH level, but  free T4 within normal limits, unclear if he has true hyperthyroidism, most likely sick euthyroid syndrome, will need repeat  TSH and free T4 in 4 weeks.  Pneumonia - Chest x-ray with small focal area at left lung base, patient with low-grade temperature, leukocytosis. - Continue with IV Rocephin and azithromycin - blood cultures with no growth to date  Humerus fracture - Orthopedic consult greatly appreciated, continue with sling and when necessary pain medication - fracture can be followed as an outpatient - pain medication has been adjusted, patient urine drug screen was negative for opiates on admission.  Tobacco abuse - Counseled  Code Status: Full  Family Communication: Discussed with patient  Disposition Plan: PT recommending SNF   Procedures  None   Consults   Cardiology Orthopedic   Medications  Scheduled Meds: . amiodarone  200 mg Oral BID  . azithromycin  500 mg Oral Q24H  . cefTRIAXone (ROCEPHIN)  IV  1 g Intravenous Once  . cefTRIAXone (ROCEPHIN)  IV  1 g Intravenous Q24H  . enoxaparin (LOVENOX) injection  40 mg Subcutaneous Q24H  . folic acid  1 mg Oral Daily  . LORazepam  0-4 mg Intravenous Q6H   Followed by  . LORazepam  0-4 mg Intravenous Q12H  . multivitamin with minerals  1 tablet Oral Daily  . thiamine  100 mg Oral Daily   Or  . thiamine  100 mg Intravenous Daily   Continuous Infusions: . sodium chloride 75 mL/hr at 05/15/15 0442   PRN Meds:.ALPRAZolam, bisacodyl, carisoprodol, ipratropium, LORazepam **OR** LORazepam, ondansetron **OR** ondansetron (ZOFRAN) IV, oxyCODONE, polyethylene glycol  DVT Prophylaxis  Lovenox   Lab Results  Component Value Date   PLT 245 05/15/2015    Antibiotics    Anti-infectives    Start     Dose/Rate Route Frequency Ordered Stop   05/14/15 1800  azithromycin (ZITHROMAX) tablet 500 mg     500 mg Oral Every 24 hours 05/13/15 1816 05/20/15 1759   05/14/15 1600  cefTRIAXone (ROCEPHIN) 1 g in dextrose 5 % 50 mL IVPB     1 g 100 mL/hr over 30 Minutes Intravenous Every 24 hours 05/13/15 1816 05/20/15 1559   05/13/15 1445   azithromycin (ZITHROMAX) 500 mg in dextrose 5 % 250 mL IVPB     500 mg 250 mL/hr over 60 Minutes Intravenous  Once 05/13/15 1431 05/13/15 2300   05/13/15 1445  cefTRIAXone (ROCEPHIN) 1 g in dextrose 5 % 50 mL IVPB     1 g 100 mL/hr over 30 Minutes Intravenous  Once 05/13/15 1431            Objective:   Filed Vitals:   05/15/15 0155 05/15/15 0609 05/15/15 0612 05/15/15 1248  BP:  134/82  118/78  Pulse:    88  Temp: 97.3 F (36.3 C) 96.9 F (36.1 C)    TempSrc: Oral Oral  Oral  Resp:  19  21  Height:      Weight:   88.179 kg (194 lb 6.4 oz)   SpO2:  94%  95%    Wt Readings from Last 3 Encounters:  05/15/15 88.179 kg (194 lb 6.4 oz)  02/21/15 80.4 kg (177 lb 4 oz)     Intake/Output Summary (Last 24 hours) at 05/15/15 1327 Last data filed at 05/15/15 1251  Gross per 24 hour  Intake 3611.75 ml  Output   2225 ml  Net 1386.75 ml     Physical Exam  Awake Alert, Oriented X 3, No new F.N deficits, Normal affect [place].AT,PERRAL Supple Neck,No JVD, No cervical lymphadenopathy appriciated.  Symmetrical Chest wall movement, Good air movement bilaterally, CTAB RRR,No Gallops,Rubs or new Murmurs, No Parasternal Heave +ve B.Sounds, Abd Soft, No tenderness, No organomegaly appriciated, No rebound - guarding or rigidity. No Cyanosis, bilateral BKA, left arm in sling   Data Review   Micro Results Recent Results (from the past 240 hour(s))  Culture, blood (Routine X 2) w Reflex to ID Panel     Status: None (Preliminary result)   Collection Time: 05/13/15  3:32 PM  Result Value Ref Range Status   Specimen Description BLOOD RIGHT ANTECUBITAL  Final   Special Requests BOTTLES DRAWN AEROBIC AND ANAEROBIC 5CC  Final   Culture NO GROWTH 2 DAYS  Final   Report Status PENDING  Incomplete  Culture, blood (Routine X 2) w Reflex to ID Panel     Status: None (Preliminary result)   Collection Time: 05/13/15  3:38 PM  Result Value Ref Range Status   Specimen Description BLOOD RIGHT HAND   Final   Special Requests BOTTLES DRAWN AEROBIC AND ANAEROBIC 5CC  Final   Culture NO GROWTH 2 DAYS  Final   Report Status PENDING  Incomplete  MRSA PCR Screening     Status: Abnormal   Collection Time: 05/14/15 12:20 AM  Result Value Ref Range Status   MRSA by PCR POSITIVE (A) NEGATIVE Final    Comment:        The GeneXpert MRSA Assay (FDA approved for NASAL specimens only), is one component of a comprehensive MRSA colonization surveillance program. It is not  intended to diagnose MRSA infection nor to guide or monitor treatment for MRSA infections. RESULT CALLED TO, READ BACK BY AND VERIFIED WITH: T AYERS,RN @0447  05/14/15 [facility]     Radiology Reports Ct Shoulder Left Wo Contrast  05/13/2015  CLINICAL DATA:  Larey Seat.  Injured left shoulder. EXAM: CT OF THE LEFT SHOULDER WITHOUT CONTRAST TECHNIQUE: Multidetector CT imaging was performed according to the standard protocol. Multiplanar CT image reconstructions were also generated. COMPARISON:  Radiographs 05/13/2015 FINDINGS: There is a common to did but relatively nondisplaced fracture of the greater tuberosity. No definite transverse fracture through the humeral neck the lesser tuberosity is intact. The glenoid is intact. The [facility] joint is intact. No clavicle fracture subchondral cystic changes noted in the humeral head. The rotator cuff tendons are grossly normal. The left ribs are intact. The visualized left lung demonstrates left basilar atelectasis. No worrisome pulmonary lesions. IMPRESSION: 1. Comminuted but relatively nondisplaced greater tuberosity fracture. No definite humeral neck fracture. 2. No scapular or clavicle fracture. 3. Left lower lobe atelectasis but no worrisome lung lesions. Electronically Signed   By: Rudie Meyer M.D.   On: 05/13/2015 16:43   Dg Chest Port 1 View  05/13/2015  CLINICAL DATA:  Left shoulder pain and bilateral leg pain secondary to a fall today. EXAM: PORTABLE CHEST 1 VIEW COMPARISON:  02/21/2015 FINDINGS:  There is a new small patchy infiltrate at the left lung base with slight atelectasis. Lungs are otherwise clear. Heart size and vascularity are normal. No appreciable osseous abnormality. IMPRESSION: Small focal area of infiltrate and atelectasis at the left lung base. This could represent a small focal area of aspiration pneumonitis. Electronically Signed   By: Francene Boyers M.D.   On: 05/13/2015 12:46   Dg Shoulder Left  05/13/2015  CLINICAL DATA:  Fall.  Arm pain EXAM: LEFT SHOULDER - 2+ VIEW COMPARISON:  None. FINDINGS: Nondisplaced fracture left humeral neck. Fracture of the greater tuberosity of the humerus. Normal alignment.  No other fracture identified. IMPRESSION: Nondisplaced fracture left humeral neck and greater tuberosity. Electronically Signed   By: Marlan Palau M.D.   On: 05/13/2015 11:06     CBC  Recent Labs Lab 05/13/15 1257 05/14/15 0448 05/15/15 0535  WBC 14.2* 14.7* 10.6*  HGB 13.9 14.2 13.0  HCT 42.6 43.9 40.2  PLT 266 263 245  MCV 93.8 93.8 94.6  MCH 30.6 30.3 30.6  MCHC 32.6 32.3 32.3  RDW 14.8 15.0 15.1  LYMPHSABS 3.0 1.4  --   MONOABS 0.9 0.5  --   EOSABS 0.2 0.0  --   BASOSABS 0.0 0.0  --     Chemistries   Recent Labs Lab 05/13/15 1257 05/14/15 0314 05/14/15 0448 05/15/15 0535  NA 142  --  142 140  K 4.4  --  4.2 4.0  CL 109  --  113* 108  CO2 24  --  21* 25  GLUCOSE 152*  --  168* 95  BUN 11  --  10 13  CREATININE 0.70  --  0.62 0.64  CALCIUM 8.9  --  8.9 8.5*  MG  --   --  2.0  --   AST  --  19 19  --   ALT  --  21 20  --   ALKPHOS  --  81 80  --   BILITOT  --  0.5 0.4  --    ------------------------------------------------------------------------------------------------------------------ estimated creatinine clearance is 69.3 mL/min (by C-G formula based on Cr of 0.64). ------------------------------------------------------------------------------------------------------------------ No  results for input(s): HGBA1C in the last 72  hours. ------------------------------------------------------------------------------------------------------------------ No results for input(s): CHOL, HDL, LDLCALC, TRIG, CHOLHDL, LDLDIRECT in the last 72 hours. ------------------------------------------------------------------------------------------------------------------  Recent Labs  05/14/15 0314  TSH 0.204*   ------------------------------------------------------------------------------------------------------------------  Recent Labs  05/13/15 1945  VITAMINB12 473    Coagulation profile No results for input(s): INR, PROTIME in the last 168 hours.  No results for input(s): DDIMER in the last 72 hours.  Cardiac Enzymes No results for input(s): CKMB, TROPONINI, MYOGLOBIN in the last 168 hours.  Invalid input(s): CK ------------------------------------------------------------------------------------------------------------------ Invalid input(s): POCBNP     Time Spent in minutes   30 minutes   Betzalel Umbarger M.D on 05/15/2015 at 1:27 PM  Between 7am to 7pm - Pager - [phone]  After 7pm go to [email or web address] - password [facility]  [facility]   Office  [phone]

## 2015-05-15 NOTE — Progress Notes (Signed)
Subjective: Complains of left sh pain not controlled by current meds. Also inability to mobilize himself .   Objective: Vital signs in last 24 hours: Temp:  [96.9 F (36.1 C)-97.3 F (36.3 C)] 96.9 F (36.1 C) (01/08 0609) Pulse Rate:  [72-75] 72 (01/07 1806) Resp:  [14-19] 19 (01/08 0609) BP: (92-134)/(60-93) 134/82 mmHg (01/08 0609) SpO2:  [94 %-95 %] 94 % (01/08 0609) Weight:  [88.179 kg (194 lb 6.4 oz)] 88.179 kg (194 lb 6.4 oz) (01/08 0612)  Intake/Output from previous day: 01/07 0701 - 01/08 0700 In: 3891.8 [P.O.:1420; I.V.:2421.8; IV Piggyback:50] Out: 1775 [Urine:1775] Intake/Output this shift: Total I/O In: -  Out: 900 [Urine:900]   Recent Labs  05/13/15 1257 05/14/15 0448 05/15/15 0535  HGB 13.9 14.2 13.0    Recent Labs  05/14/15 0448 05/15/15 0535  WBC 14.7* 10.6*  RBC 4.68 4.25  HCT 43.9 40.2  PLT 263 245    Recent Labs  05/14/15 0448 05/15/15 0535  NA 142 140  K 4.2 4.0  CL 113* 108  CO2 21* 25  BUN 10 13  CREATININE 0.62 0.64  GLUCOSE 168* 95  CALCIUM 8.9 8.5*   No results for input(s): LABPT, INR in the last 72 hours.  Intact pulses distally Compartment soft Sh immob in place. Assessment/Plan: Will have nurse contact attending physician about increasing meds. Has been on chronic pain meds has high tolerance.  Also will order OHF ans Trap for bed . FX can be folowed up as outpt needs xray in couple of weeks.   Vishal Sandlin ANDREW 05/15/2015, 11:17 AM

## 2015-05-15 NOTE — Progress Notes (Signed)
RN receiving high heart rate alerts from telemetry.  EKG completed and confirms atrial fibrillation with rapid ventricular response.  Heart rate primarily 130-140's, even up into the 150's.  Patient states when he gets a rise in pain he feels like that makes his heart rate go up and then he can feel a "little fluttering" in his chest, denies chest pain and denies shortness of breath at this time.  Cardiology paged and RN spoke with Loney Loh, order received.

## 2015-05-15 NOTE — Progress Notes (Signed)
Subjective:  Complains of severe shoulder pain.  Anxious and talkative.  No complaints of shortness of breath or chest pain.  Sinus rhythm overnight.  Objective:  Vital Signs in the last 24 hours: BP 134/82 mmHg  Pulse 72  Temp(Src) 96.9 F (36.1 C) (Oral)  Resp 19  Ht 4' (1.219 m)  Wt 88.179 kg (194 lb 6.4 oz)  BMI 59.34 kg/m2  SpO2 94%  Physical Exam:  Tattooed middle-aged male complaining of shoulder pain  Lungs:  Clear  Cardiac:  Regular rhythm, normal S1 and S2, no S3 Extremities:  No edema present  Intake/Output from previous day: 01/07 0701 - 01/08 0700 In: 3891.8 [P.O.:1420; I.V.:2421.8; IV Piggyback:50] Out: 1775 [Urine:1775] Weight Filed Weights   05/13/15 1822 05/14/15 0540 05/15/15 0612  Weight: 84.1 kg (185 lb 6.5 oz) 83.961 kg (185 lb 1.6 oz) 88.179 kg (194 lb 6.4 oz)    Lab Results: Basic Metabolic Panel:  Recent Labs  91/66/06 0448 05/15/15 0535  NA 142 140  K 4.2 4.0  CL 113* 108  CO2 21* 25  GLUCOSE 168* 95  BUN 10 13  CREATININE 0.62 0.64    CBC:  Recent Labs  05/13/15 1257 05/14/15 0448 05/15/15 0535  WBC 14.2* 14.7* 10.6*  NEUTROABS 10.0* 12.8*  --   HGB 13.9 14.2 13.0  HCT 42.6 43.9 40.2  MCV 93.8 93.8 94.6  PLT 266 263 245    BNP    Component Value Date/Time   BNP 140.7* 05/14/2015 0448   Telemetry: Currently sinus rhythm.  No recurrence of atrial fibrillation.  Assessment/Plan:  1.  Paroxysmal atrial fibrillation with rapid response 2.  Hypertension 3.  Chronic pain syndrome with narcotic abuse 4.  Bilateral above-the-knee amputee 5.  Poor social situation  Recommendations:  Convert to oral amiodarone.  Not sure that this should be continued as an outpatient.  Echocardiogram.  Darden Palmer  MD [facility]  05/15/2015, 9:37 AM

## 2015-05-15 NOTE — Progress Notes (Signed)
Patient received Cardizem IV push per MD order.  Patient heart rate ranging 120-130's.  Cardiology paged with this information.  [place] returned page and was updated, order received.

## 2015-05-15 NOTE — Progress Notes (Signed)
Orthopedic Tech Progress Note Patient Details:  [name] [name] 1955-09-02 [id number]  Ortho Devices Type of Ortho Device: Sling immobilizer Ortho Device/Splint Location: trapeze bar patient helper Ortho Device/Splint Interventions: Application   Brenisha Tsui 05/15/2015, 11:43 AM

## 2015-05-16 ENCOUNTER — Encounter (HOSPITAL_COMMUNITY): Payer: Self-pay | Admitting: General Practice

## 2015-05-16 DIAGNOSIS — I480 Paroxysmal atrial fibrillation: Secondary | ICD-10-CM

## 2015-05-16 LAB — HEMOGLOBIN A1C
HEMOGLOBIN A1C: 6.2 % — AB (ref 4.8–5.6)
MEAN PLASMA GLUCOSE: 131 mg/dL

## 2015-05-16 MED ORDER — MUPIROCIN 2 % EX OINT
1.0000 "application " | TOPICAL_OINTMENT | Freq: Two times a day (BID) | CUTANEOUS | Status: DC
  Administered 2015-05-16 – 2015-05-17 (×3): 1 via NASAL
  Filled 2015-05-16: qty 22

## 2015-05-16 MED ORDER — METOPROLOL TARTRATE 50 MG PO TABS
50.0000 mg | ORAL_TABLET | Freq: Two times a day (BID) | ORAL | Status: DC
  Administered 2015-05-16 – 2015-05-17 (×2): 50 mg via ORAL
  Filled 2015-05-16 (×3): qty 1

## 2015-05-16 MED ORDER — CHLORHEXIDINE GLUCONATE CLOTH 2 % EX PADS
6.0000 | MEDICATED_PAD | Freq: Every day | CUTANEOUS | Status: DC
  Administered 2015-05-16 – 2015-05-17 (×2): 6 via TOPICAL

## 2015-05-16 MED ORDER — ASPIRIN EC 81 MG PO TBEC
81.0000 mg | DELAYED_RELEASE_TABLET | Freq: Every day | ORAL | Status: DC
  Administered 2015-05-16 – 2015-05-17 (×2): 81 mg via ORAL
  Filled 2015-05-16 (×2): qty 1

## 2015-05-16 MED ORDER — AMIODARONE HCL 200 MG PO TABS
400.0000 mg | ORAL_TABLET | Freq: Two times a day (BID) | ORAL | Status: DC
  Administered 2015-05-16 – 2015-05-17 (×2): 400 mg via ORAL
  Filled 2015-05-16 (×2): qty 2
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
## 2015-05-16 NOTE — Clinical Social Work Placement (Signed)
   CLINICAL SOCIAL WORK PLACEMENT  NOTE  Date:  05/16/2015  Patient Details  Name: [name] [name] MRN: [id number] Date of Birth: Jan 28, 1956  Clinical Social Work is seeking post-discharge placement for this patient at the Skilled  Nursing Facility level of care (*CSW will initial, date and re-position this form in  chart as items are completed):  Yes   Patient/family provided with [place] Clinical Social Work Department's list of facilities offering this level of care within the geographic area requested by the patient (or if unable, by the patient's family).  Yes   Patient/family informed of their freedom to choose among providers that offer the needed level of care, that participate in Medicare, Medicaid or managed care program needed by the patient, have an available bed and are willing to accept the patient.  Yes   Patient/family informed of [facility]'s ownership interest in [facility] and [facility], as well as of the fact that they are under no obligation to receive care at these facilities.  PASRR submitted to EDS on 05/16/15     PASRR number received on       Existing PASRR number confirmed on       FL2 transmitted to all facilities in geographic area requested by pt/family on 05/16/15     FL2 transmitted to all facilities within larger geographic area on 05/16/15     Patient informed that his/her managed care company has contracts with or will negotiate with certain facilities, including the following:            Patient/family informed of bed offers received.  Patient chooses bed at       Physician recommends and patient chooses bed at      Patient to be transferred to   on  .  Patient to be transferred to facility by       Patient family notified on   of transfer.  Name of family member notified:        PHYSICIAN       Additional Comment:    _______________________________________________ Roddie Mc MSW, LCSW, [place], [phone]

## 2015-05-16 NOTE — Care Management Important Message (Signed)
Important Message  Patient Details  Name: [name] [name] MRN: [id number] Date of Birth: [birth year]   Medicare Important Message Given:  Yes    Rayvon Char 05/16/2015, 1:25 PMImportant Message  Patient Details  Name: [name] [name] MRN: [id number] Date of Birth: Jul 15, 1955   Medicare Important Message Given:  Yes    Tamu Golz G 05/16/2015, 1:25 PM

## 2015-05-16 NOTE — Progress Notes (Signed)
Subjective:  Went back into atrial fibrillation last night.  Currently rate controlled on IV cardizem.  Severe shoulder pain.  NO chest pain or SOB.  Mult social issues.  Objective:  Vital Signs in the last 24 hours: BP 114/79 mmHg  Pulse 93  Temp(Src) 98.6 F (37 C) (Oral)  Resp 16  Ht 4' (1.219 m)  Wt 83.054 kg (183 lb 1.6 oz)  BMI 55.89 kg/m2  SpO2 95%  Physical Exam:  Tattooed middle-aged male complaining of shoulder pain  Lungs:  Clear  Cardiac:  irregular rhythm, normal S1 and S2, no S3 Extremities:  No edema present  Intake/Output from previous day: 01/08 0701 - 01/09 0700 In: 2595.2 [P.O.:1700; I.V.:895.2] Out: 3250 [Urine:3250] Weight Filed Weights   05/14/15 0540 05/15/15 0612 05/16/15 0636  Weight: 83.961 kg (185 lb 1.6 oz) 88.179 kg (194 lb 6.4 oz) 83.054 kg (183 lb 1.6 oz)    Lab Results: Basic Metabolic Panel:  Recent Labs  24/09/73 0448 05/15/15 0535  NA 142 140  K 4.2 4.0  CL 113* 108  CO2 21* 25  GLUCOSE 168* 95  BUN 10 13  CREATININE 0.62 0.64    CBC:  Recent Labs  05/13/15 1257 05/14/15 0448 05/15/15 0535  WBC 14.2* 14.7* 10.6*  NEUTROABS 10.0* 12.8*  --   HGB 13.9 14.2 13.0  HCT 42.6 43.9 40.2  MCV 93.8 93.8 94.6  PLT 266 263 245    BNP    Component Value Date/Time   BNP 140.7* 05/14/2015 0448   Telemetry: Atrial fibrillation rates around 110  Assessment/Plan:  1.  Paroxysmal atrial fibrillation with rapid response 2.  Hypertension 3.  Chronic pain syndrome with narcotic abuse 4.  Bilateral above-the-knee amputee 5.  Poor social situation  Recommendations:  Add beta blocker and increase oral amiodarone.  CHA2DS2VASC score is 1 b/o BP.  Not great candidate for long term anticoagulation.   Darden Palmer  MD [facility]  05/16/2015, 9:33 AM

## 2015-05-16 NOTE — Care Management Important Message (Signed)
Important Message  Patient Details  Name: [name] [name] MRN: [id number] Date of Birth: 1955-10-27   Medicare Important Message Given:  Yes    Gala Lewandowsky, RN 05/16/2015, 11:13 AM

## 2015-05-16 NOTE — Consult Note (Signed)
WOC consulted for area on right stump. However today at the time of my assessment no open wounds and no eschar.  Apparently had a scab which is now gone and the skin is intact under the area of concern.  Patient verbalized concerns about his housing, his wheelchair.  Reports buttocks are red.  I have assessed this area as well, no skin breakdown currently but would recommend air cushion for wheelchair if staff are able to locate his chair.  He verbalized needs for meds and to address current social situation/housing.  I have suggested he discuss with hospitalist and social worker respectively.   [name] [name] [place], [place] [phone]

## 2015-05-16 NOTE — Progress Notes (Signed)
Patient Demographics  [name] [name], is a 60 y.o. male, DOB - 03/23/1956, XTK:[id number]  Admit date - 05/13/2015   Admitting Physician Calvert Cantor, MD  Outpatient Primary MD for the patient is No primary care provider on file.  LOS - 3   Chief Complaint  Patient presents with  . Fall       Admission HPI/Brief narrative: 60 yo man with Schizophrenia, chronic pain, HTN, bilateral AKA, hepatitis C, homeless,  admitted with altered mental status, fall, left humeral fracture , workup was significant for pneumonia , as well patient developed A. fib with RVR during hospital stay, initially on Cardizem drip, with poor control, transitioned to amiodarone drip, and A. FIB on by mouth amiodarone , back on Cardizem drip , started on beta blockers , as well patient diagnosed with pneumonia, treated with Rocephin and azithromycin. - As well patient with left humerus fracture, no need for surgical intervention currently per ortho.   Subjective:   [name] [name] today has, No headache, No chest pain, No abdominal pain - No Nausea, patient complains of left shoulder pain. Assessment & Plan    Principal Problem:   Acute respiratory failure (HCC) Active Problems:   S/P bilateral above knee amputation (HCC)   Tobacco abuse   Schizophrenia (HCC)   Chronic pain   Humerus fracture   HCAP (healthcare-associated pneumonia)   Acute encephalopathy  Acute encephalopathy: - this is most likely related to pneumonia and benzodiazepine(as urine drug screen positive for benzodiazepine/patient is on soma) - Resolved, back to baseline  A. fib with RVR - Cardiology consult appreciated, initially on Cardizem drip, with poor control, converted to NSR on  amiodarone drip, unfortunately back in A. fib with RVR on by mouth amiodarone, currently on Cardizem drip, patient started on beta blocker for better heart rate control. -  CHA2DS2VASC score is 1 b/o BP, is limited to candidate for long-term anticoagulation given his social situation, start on aspirin 81 mg daily. - Patient with low TSH level, but  free T4 within normal limits, unclear if he has true hyperthyroidism, most likely sick euthyroid syndrome, will need repeat TSH and free T4 in 4 weeks.  Pneumonia - Chest x-ray with small focal area at left lung base, patient with low-grade temperature, leukocytosis. - Continue with IV Rocephin and azithromycin - blood cultures with no growth to date  Humerus fracture - Orthopedic consult greatly appreciated, continue with sling and when necessary pain medication - fracture can be followed as an outpatient - pain medication has been adjusted, patient urine drug screen was negative for opiates on admission, patient was told not to wait until pain is uncontrolled for asking for pain medicine.   Tobacco abuse - Counseled  Code Status: Full  Family Communication: Discussed with patient  Disposition Plan: PT recommending SNF   Procedures  None   Consults   Cardiology Orthopedic   Medications  Scheduled Meds: . amiodarone  400 mg Oral BID  . azithromycin  500 mg Oral Q24H  . cefTRIAXone (ROCEPHIN)  IV  1 g Intravenous Once  . cefTRIAXone (ROCEPHIN)  IV  1 g Intravenous Q24H  . Chlorhexidine Gluconate Cloth  6 each Topical Q0600  . enoxaparin (LOVENOX) injection  40 mg Subcutaneous Q24H  .  folic acid  1 mg Oral Daily  . LORazepam  0-4 mg Intravenous Q12H  . metoprolol tartrate  50 mg Oral BID  . multivitamin with minerals  1 tablet Oral Daily  . mupirocin ointment  1 application Nasal BID  . thiamine  100 mg Oral Daily   Continuous Infusions: . sodium chloride 75 mL/hr at 05/16/15 0354  . diltiazem (CARDIZEM) infusion 10 mg/hr (05/16/15 1000)   PRN Meds:.ALPRAZolam, bisacodyl, carisoprodol, ipratropium, LORazepam **OR** LORazepam, morphine injection, ondansetron **OR** ondansetron (ZOFRAN) IV,  oxyCODONE, polyethylene glycol  DVT Prophylaxis  Lovenox   Lab Results  Component Value Date   PLT 245 05/15/2015    Antibiotics    Anti-infectives    Start     Dose/Rate Route Frequency Ordered Stop   05/14/15 1800  azithromycin (ZITHROMAX) tablet 500 mg     500 mg Oral Every 24 hours 05/13/15 1816 05/20/15 1759   05/14/15 1600  cefTRIAXone (ROCEPHIN) 1 g in dextrose 5 % 50 mL IVPB     1 g 100 mL/hr over 30 Minutes Intravenous Every 24 hours 05/13/15 1816 05/20/15 1559   05/13/15 1445  azithromycin (ZITHROMAX) 500 mg in dextrose 5 % 250 mL IVPB     500 mg 250 mL/hr over 60 Minutes Intravenous  Once 05/13/15 1431 05/13/15 2300   05/13/15 1445  cefTRIAXone (ROCEPHIN) 1 g in dextrose 5 % 50 mL IVPB     1 g 100 mL/hr over 30 Minutes Intravenous  Once 05/13/15 1431            Objective:   Filed Vitals:   05/16/15 0358 05/16/15 0518 05/16/15 0636 05/16/15 0843  BP:    114/79  Pulse:    93  Temp:  97.3 F (36.3 C)  98.6 F (37 C)  TempSrc:  Oral  Oral  Resp:    16  Height:      Weight:   83.054 kg (183 lb 1.6 oz)   SpO2: 95%   95%    Wt Readings from Last 3 Encounters:  05/16/15 83.054 kg (183 lb 1.6 oz)  02/21/15 80.4 kg (177 lb 4 oz)     Intake/Output Summary (Last 24 hours) at 05/16/15 1101 Last data filed at 05/16/15 1000  Gross per 24 hour  Intake 3210.24 ml  Output   2350 ml  Net 860.24 ml     Physical Exam  Awake Alert, Oriented X 3, No new F.N deficits, Normal affect [place].AT,PERRAL Supple Neck,No JVD, No cervical lymphadenopathy appriciated.  Symmetrical Chest wall movement, Good air movement bilaterally, CTAB irregular,No Gallops,Rubs or new Murmurs, No Parasternal Heave +ve B.Sounds, Abd Soft, No tenderness, No organomegaly appriciated, No rebound - guarding or rigidity. No Cyanosis, bilateral BKA, left arm in sling/immobilizer.   Data Review   Micro Results Recent Results (from the past 240 hour(s))  Culture, blood (Routine X 2) w Reflex  to ID Panel     Status: None (Preliminary result)   Collection Time: 05/13/15  3:32 PM  Result Value Ref Range Status   Specimen Description BLOOD RIGHT ANTECUBITAL  Final   Special Requests BOTTLES DRAWN AEROBIC AND ANAEROBIC 5CC  Final   Culture NO GROWTH 2 DAYS  Final   Report Status PENDING  Incomplete  Culture, blood (Routine X 2) w Reflex to ID Panel     Status: None (Preliminary result)   Collection Time: 05/13/15  3:38 PM  Result Value Ref Range Status   Specimen Description BLOOD RIGHT HAND  Final  Special Requests BOTTLES DRAWN AEROBIC AND ANAEROBIC 5CC  Final   Culture NO GROWTH 2 DAYS  Final   Report Status PENDING  Incomplete  MRSA PCR Screening     Status: Abnormal   Collection Time: 05/14/15 12:20 AM  Result Value Ref Range Status   MRSA by PCR POSITIVE (A) NEGATIVE Final    Comment:        The GeneXpert MRSA Assay (FDA approved for NASAL specimens only), is one component of a comprehensive MRSA colonization surveillance program. It is not intended to diagnose MRSA infection nor to guide or monitor treatment for MRSA infections. RESULT CALLED TO, READ BACK BY AND VERIFIED WITH: T AYERS,RN @0447  05/14/15 [facility]     Radiology Reports Ct Shoulder Left Wo Contrast  05/13/2015  CLINICAL DATA:  Larey Seat.  Injured left shoulder. EXAM: CT OF THE LEFT SHOULDER WITHOUT CONTRAST TECHNIQUE: Multidetector CT imaging was performed according to the standard protocol. Multiplanar CT image reconstructions were also generated. COMPARISON:  Radiographs 05/13/2015 FINDINGS: There is a common to did but relatively nondisplaced fracture of the greater tuberosity. No definite transverse fracture through the humeral neck the lesser tuberosity is intact. The glenoid is intact. The [facility] joint is intact. No clavicle fracture subchondral cystic changes noted in the humeral head. The rotator cuff tendons are grossly normal. The left ribs are intact. The visualized left lung demonstrates left basilar  atelectasis. No worrisome pulmonary lesions. IMPRESSION: 1. Comminuted but relatively nondisplaced greater tuberosity fracture. No definite humeral neck fracture. 2. No scapular or clavicle fracture. 3. Left lower lobe atelectasis but no worrisome lung lesions. Electronically Signed   By: Rudie Meyer M.D.   On: 05/13/2015 16:43   Dg Chest Port 1 View  05/13/2015  CLINICAL DATA:  Left shoulder pain and bilateral leg pain secondary to a fall today. EXAM: PORTABLE CHEST 1 VIEW COMPARISON:  02/21/2015 FINDINGS: There is a new small patchy infiltrate at the left lung base with slight atelectasis. Lungs are otherwise clear. Heart size and vascularity are normal. No appreciable osseous abnormality. IMPRESSION: Small focal area of infiltrate and atelectasis at the left lung base. This could represent a small focal area of aspiration pneumonitis. Electronically Signed   By: Francene Boyers M.D.   On: 05/13/2015 12:46   Dg Shoulder Left  05/13/2015  CLINICAL DATA:  Fall.  Arm pain EXAM: LEFT SHOULDER - 2+ VIEW COMPARISON:  None. FINDINGS: Nondisplaced fracture left humeral neck. Fracture of the greater tuberosity of the humerus. Normal alignment.  No other fracture identified. IMPRESSION: Nondisplaced fracture left humeral neck and greater tuberosity. Electronically Signed   By: Marlan Palau M.D.   On: 05/13/2015 11:06     CBC  Recent Labs Lab 05/13/15 1257 05/14/15 0448 05/15/15 0535  WBC 14.2* 14.7* 10.6*  HGB 13.9 14.2 13.0  HCT 42.6 43.9 40.2  PLT 266 263 245  MCV 93.8 93.8 94.6  MCH 30.6 30.3 30.6  MCHC 32.6 32.3 32.3  RDW 14.8 15.0 15.1  LYMPHSABS 3.0 1.4  --   MONOABS 0.9 0.5  --   EOSABS 0.2 0.0  --   BASOSABS 0.0 0.0  --     Chemistries   Recent Labs Lab 05/13/15 1257 05/14/15 0314 05/14/15 0448 05/15/15 0535  NA 142  --  142 140  K 4.4  --  4.2 4.0  CL 109  --  113* 108  CO2 24  --  21* 25  GLUCOSE 152*  --  168* 95  BUN 11  --  10 13  CREATININE 0.70  --  0.62 0.64   CALCIUM 8.9  --  8.9 8.5*  MG  --   --  2.0  --   AST  --  19 19  --   ALT  --  21 20  --   ALKPHOS  --  81 80  --   BILITOT  --  0.5 0.4  --    ------------------------------------------------------------------------------------------------------------------ estimated creatinine clearance is 66.5 mL/min (by C-G formula based on Cr of 0.64). ------------------------------------------------------------------------------------------------------------------  Recent Labs  05/13/15 1945  HGBA1C 6.2*   ------------------------------------------------------------------------------------------------------------------ No results for input(s): CHOL, HDL, LDLCALC, TRIG, CHOLHDL, LDLDIRECT in the last 72 hours. ------------------------------------------------------------------------------------------------------------------  Recent Labs  05/14/15 0314  TSH 0.204*   ------------------------------------------------------------------------------------------------------------------  Recent Labs  05/13/15 1945  VITAMINB12 473    Coagulation profile No results for input(s): INR, PROTIME in the last 168 hours.  No results for input(s): DDIMER in the last 72 hours.  Cardiac Enzymes No results for input(s): CKMB, TROPONINI, MYOGLOBIN in the last 168 hours.  Invalid input(s): CK ------------------------------------------------------------------------------------------------------------------ Invalid input(s): POCBNP     Time Spent in minutes   30 minutes   Joanny Dupree M.D on 05/16/2015 at 11:01 AM  Between 7am to 7pm - Pager - [phone]  After 7pm go to [email or web address] - password [email or web address]  [facility]   Office  [phone]

## 2015-05-17 DIAGNOSIS — I480 Paroxysmal atrial fibrillation: Secondary | ICD-10-CM

## 2015-05-17 DIAGNOSIS — J9600 Acute respiratory failure, unspecified whether with hypoxia or hypercapnia: Secondary | ICD-10-CM

## 2015-05-17 HISTORY — DX: Paroxysmal atrial fibrillation: I48.0

## 2015-05-17 LAB — BASIC METABOLIC PANEL
ANION GAP: 10 (ref 5–15)
BUN: 12 mg/dL (ref 6–20)
CHLORIDE: 102 mmol/L (ref 101–111)
CO2: 26 mmol/L (ref 22–32)
Calcium: 8.6 mg/dL — ABNORMAL LOW (ref 8.9–10.3)
Creatinine, Ser: 0.7 mg/dL (ref 0.61–1.24)
GFR calc Af Amer: >60 mL/min (ref >60)
GFR calc non Af Amer: >60 mL/min (ref >60)
GLUCOSE: 97 mg/dL (ref 65–99)
POTASSIUM: 4 mmol/L (ref 3.5–5.1)
Sodium: 138 mmol/L (ref 135–145)

## 2015-05-17 LAB — FOLATE RBC
FOLATE, RBC: 1167 ng/mL (ref >498)
Folate, Hemolysate: 505.2 ng/mL
Hematocrit: 43.3 % (ref 37.5–51.0)

## 2015-05-17 LAB — MAGNESIUM: Magnesium: 2.2 mg/dL (ref 1.7–2.4)

## 2015-05-17 MED ORDER — ASPIRIN 81 MG PO TBEC: 81.0000 mg | DELAYED_RELEASE_TABLET | Freq: Every day | ORAL | Status: DC

## 2015-05-17 MED ORDER — MORPHINE SULFATE ER 15 MG PO TBCR
15.0000 mg | EXTENDED_RELEASE_TABLET | Freq: Two times a day (BID) | ORAL | Status: DC
  Administered 2015-05-17: 15 mg via ORAL
  Filled 2015-05-17: qty 1

## 2015-05-17 MED ORDER — METOPROLOL TARTRATE 50 MG PO TABS: 50.0000 mg | ORAL_TABLET | Freq: Two times a day (BID) | ORAL | Status: DC

## 2015-05-17 MED ORDER — AMIODARONE HCL 200 MG PO TABS: 200.0000 mg | ORAL_TABLET | Freq: Every day | ORAL | Status: DC

## 2015-05-17 MED ORDER — ADULT MULTIVITAMIN W/MINERALS CH: 1.0000 | ORAL_TABLET | Freq: Every day | ORAL | Status: DC

## 2015-05-17 MED ORDER — THIAMINE HCL 100 MG PO TABS: 100.0000 mg | ORAL_TABLET | Freq: Every day | ORAL | Status: DC

## 2015-05-17 MED ORDER — BISACODYL 10 MG RE SUPP: 10.0000 mg | Freq: Every day | RECTAL | Status: DC | PRN

## 2015-05-17 MED ORDER — OXYCODONE HCL 5 MG PO TABS: 5.0000 mg | ORAL_TABLET | ORAL | Status: DC | PRN

## 2015-05-17 MED ORDER — ALPRAZOLAM 1 MG PO TABS: 1.0000 mg | ORAL_TABLET | Freq: Two times a day (BID) | ORAL | Status: DC | PRN

## 2015-05-17 MED ORDER — FOLIC ACID 1 MG PO TABS: 1.0000 mg | ORAL_TABLET | Freq: Every day | ORAL | Status: DC

## 2015-05-17 MED ORDER — CARISOPRODOL 350 MG PO TABS: 350.0000 mg | ORAL_TABLET | Freq: Three times a day (TID) | ORAL | Status: DC | PRN

## 2015-05-17 MED ORDER — MORPHINE SULFATE ER 15 MG PO TBCR: 15.0000 mg | EXTENDED_RELEASE_TABLET | Freq: Two times a day (BID) | ORAL | Status: DC

## 2015-05-17 NOTE — Progress Notes (Signed)
PT Cancellation Note  Patient Details Name: [name] [name] MRN: [id number] DOB: 04-05-1956   Cancelled Treatment:    Reason Eval/Treat Not Completed: Other (comment) (Per RN pt preparing for D/C to SNF).  PT will continue to follow as long as pt remains in hospital.  Michail Jewels PT, DPT [phone] Pager: [phone] 05/17/2015, 1:49 PM
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
## 2015-05-17 NOTE — Progress Notes (Signed)
Report called to [facility]. IV removed. Pt educated

## 2015-05-17 NOTE — Clinical Social Work Placement (Signed)
   CLINICAL SOCIAL WORK PLACEMENT  NOTE  Date:  05/17/2015  Patient Details  Name: [name] [name] MRN: [id number] Date of Birth: [birth year]  Clinical Social Work is seeking post-discharge placement for this patient at the Skilled  Nursing Facility level of care (*CSW will initial, date and re-position this form in  chart as items are completed):  Yes   Patient/family provided with [facility] Clinical Social Work Department's list of facilities offering this level of care within the geographic area requested by the patient (or if unable, by the patient's family).  Yes   Patient/family informed of their freedom to choose among providers that offer the needed level of care, that participate in Medicare, Medicaid or managed care program needed by the patient, have an available bed and are willing to accept the patient.  Yes   Patient/family informed of 's ownership interest in [facility] and [facility], as well as of the fact that they are under no obligation to receive care at these facilities.  PASRR submitted to EDS on 05/16/15     PASRR number received on 05/17/15     Existing PASRR number confirmed on       FL2 transmitted to all facilities in geographic area requested by pt/family on 05/16/15     FL2 transmitted to all facilities within larger geographic area on 05/16/15     Patient informed that his/her managed care company has contracts with or will negotiate with certain facilities, including the following:        Yes   Patient/family informed of bed offers received.  Patient chooses bed at [facility]     Physician recommends and patient chooses bed at      Patient to be transferred to [facility] on 05/17/15.  Patient to be transferred to facility by AMbulance     Patient family notified on 05/17/15 of transfer.  Name of family member notified:  Patient notified family.     PHYSICIAN Please prepare priority discharge summary, including  medications, Please prepare prescriptions, Please sign FL2     Additional Comment:   [place] RN, patient, patient's family, and facility notified of DC. RN given number for report. DC packet on chart. Ambulance transport requested for patient for 1:30PM. CSW signing off.  _______________________________________________ Roddie Mc MSW, LCSW, [place], [phone]

## 2015-05-17 NOTE — Progress Notes (Signed)
Subjective:  Still complains of severe shoulder pain.  He converted to sinus rhythm at 4 PM yesterday.  Continues on higher dose amiodarone as well as metoprolol.  Awaiting transfer to a rehabilitation facility.  Prior to coming into the hospital he had been on metoprolol and was not placed back on it when he came in.  Objective:  Vital Signs in the last 24 hours: BP 118/89 mmHg  Pulse 74  Temp(Src) 98.6 F (37 C) (Oral)  Resp 15  Ht 4' (1.219 m)  Wt 78.2 kg (172 lb 6.4 oz)  BMI 52.63 kg/m2  SpO2 99%  Physical Exam:  Tattooed middle-aged male complaining of shoulder pain  Lungs:  Clear  Cardiac: Regular rhythm, normal S1 and S2, no S3 Extremities:  No edema present  Intake/Output from previous day: 01/09 0701 - 01/10 0700 In: 1837.1 [P.O.:840; I.V.:947.1; IV Piggyback:50] Out: 4050 [Urine:4050] Weight Filed Weights   05/15/15 0612 05/16/15 0636 05/17/15 0634  Weight: 88.179 kg (194 lb 6.4 oz) 83.054 kg (183 lb 1.6 oz) 78.2 kg (172 lb 6.4 oz)    Lab Results: Basic Metabolic Panel:  Recent Labs  34/03/52 0535 05/17/15 0417  NA 140 138  K 4.0 4.0  CL 108 102  CO2 25 26  GLUCOSE 95 97  BUN 13 12  CREATININE 0.64 0.70    CBC:  Recent Labs  05/15/15 0535  WBC 10.6*  HGB 13.0  HCT 40.2  MCV 94.6  PLT 245    BNP    Component Value Date/Time   BNP 140.7* 05/14/2015 0448   Telemetry: Normal sinus rhythm.  Assessment/Plan:  1.  Paroxysmal atrial fibrillation conversion to sinus rhythm.   2.  Hypertension 3.  Chronic pain syndrome with narcotic abuse 4.  Bilateral above-the-knee amputee 5.  Poor social situation  Recommendations:  Continue beta blocker on discharge.  Not sure that I would discharge him on amiodarone chronically unless we are sure that he will follow-up with medical care in the future.  May continue higher dose amiodarone while he is hospitalized here orally.Darden Palmer  MD [facility]  05/17/2015, 9:09 AM

## 2015-05-17 NOTE — Discharge Instructions (Signed)
Follow with Primary MD after discharge from SNF  Get CBC, CMP, 2 view Chest X ray checked  by Primary MD next visit.    Activity: As tolerated with Full fall precautions use walker/cane & assistance as needed   Disposition SNF   Diet: Heart Healthy  , with feeding assistance and aspiration precautions.   On your next visit with your primary care physician please Get Medicines reviewed and adjusted.   Please request your Prim.MD to go over all Hospital Tests and Procedure/Radiological results at the follow up, please get all Hospital records sent to your Prim MD by signing hospital release before you go home.   If you experience worsening of your admission symptoms, develop shortness of breath, life threatening emergency, suicidal or homicidal thoughts you must seek medical attention immediately by calling 911 or calling your MD immediately  if symptoms less severe.  You Must read complete instructions/literature along with all the possible adverse reactions/side effects for all the Medicines you take and that have been prescribed to you. Take any new Medicines after you have completely understood and accpet all the possible adverse reactions/side effects.   Do not drive, operating heavy machinery, perform activities at heights, swimming or participation in water activities or provide baby sitting services if your were admitted for syncope or siezures until you have seen by Primary MD or a Neurologist and advised to do so again.  Do not drive when taking Pain medications.    Do not take more than prescribed Pain, Sleep and Anxiety Medications  Special Instructions: If you have smoked or chewed Tobacco  in the last 2 yrs please stop smoking, stop any regular Alcohol  and or any Recreational drug use.  Wear Seat belts while driving.   Please note  You were cared for by a hospitalist during your hospital stay. If you have any questions about your discharge medications or the care  you received while you were in the hospital after you are discharged, you can call the unit and asked to speak with the hospitalist on call if the hospitalist that took care of you is not available. Once you are discharged, your primary care physician will handle any further medical issues. Please note that NO REFILLS for any discharge medications will be authorized once you are discharged, as it is imperative that you return to your primary care physician (or establish a relationship with a primary care physician if you do not have one) for your aftercare needs so that they can reassess your need for medications and monitor your lab values.

## 2015-05-18 LAB — CULTURE, BLOOD (ROUTINE X 2)
CULTURE: NO GROWTH
Culture: NO GROWTH

## 2016-05-08 DIAGNOSIS — M6281 Muscle weakness (generalized): Secondary | ICD-10-CM | POA: Diagnosis not present

## 2016-05-08 DIAGNOSIS — J189 Pneumonia, unspecified organism: Secondary | ICD-10-CM | POA: Diagnosis not present

## 2016-05-08 DIAGNOSIS — G8929 Other chronic pain: Secondary | ICD-10-CM | POA: Diagnosis not present

## 2016-05-08 DIAGNOSIS — J9600 Acute respiratory failure, unspecified whether with hypoxia or hypercapnia: Secondary | ICD-10-CM | POA: Diagnosis not present

## 2016-05-09 DIAGNOSIS — J9600 Acute respiratory failure, unspecified whether with hypoxia or hypercapnia: Secondary | ICD-10-CM | POA: Diagnosis not present

## 2016-05-09 DIAGNOSIS — J189 Pneumonia, unspecified organism: Secondary | ICD-10-CM | POA: Diagnosis not present

## 2016-05-09 DIAGNOSIS — M6281 Muscle weakness (generalized): Secondary | ICD-10-CM | POA: Diagnosis not present

## 2016-05-09 DIAGNOSIS — G8929 Other chronic pain: Secondary | ICD-10-CM | POA: Diagnosis not present

## 2016-05-10 DIAGNOSIS — J9600 Acute respiratory failure, unspecified whether with hypoxia or hypercapnia: Secondary | ICD-10-CM | POA: Diagnosis not present

## 2016-05-10 DIAGNOSIS — G8929 Other chronic pain: Secondary | ICD-10-CM | POA: Diagnosis not present

## 2016-05-10 DIAGNOSIS — J189 Pneumonia, unspecified organism: Secondary | ICD-10-CM | POA: Diagnosis not present

## 2016-05-10 DIAGNOSIS — M6281 Muscle weakness (generalized): Secondary | ICD-10-CM | POA: Diagnosis not present

## 2016-05-14 DIAGNOSIS — J9600 Acute respiratory failure, unspecified whether with hypoxia or hypercapnia: Secondary | ICD-10-CM | POA: Diagnosis not present

## 2016-05-14 DIAGNOSIS — J189 Pneumonia, unspecified organism: Secondary | ICD-10-CM | POA: Diagnosis not present

## 2016-05-14 DIAGNOSIS — G8929 Other chronic pain: Secondary | ICD-10-CM | POA: Diagnosis not present

## 2016-05-14 DIAGNOSIS — M6281 Muscle weakness (generalized): Secondary | ICD-10-CM | POA: Diagnosis not present

## 2016-05-15 DIAGNOSIS — M6281 Muscle weakness (generalized): Secondary | ICD-10-CM | POA: Diagnosis not present

## 2016-05-15 DIAGNOSIS — J189 Pneumonia, unspecified organism: Secondary | ICD-10-CM | POA: Diagnosis not present

## 2016-05-15 DIAGNOSIS — G8929 Other chronic pain: Secondary | ICD-10-CM | POA: Diagnosis not present

## 2016-05-15 DIAGNOSIS — J9600 Acute respiratory failure, unspecified whether with hypoxia or hypercapnia: Secondary | ICD-10-CM | POA: Diagnosis not present

## 2016-05-16 DIAGNOSIS — J189 Pneumonia, unspecified organism: Secondary | ICD-10-CM | POA: Diagnosis not present

## 2016-05-16 DIAGNOSIS — M6281 Muscle weakness (generalized): Secondary | ICD-10-CM | POA: Diagnosis not present

## 2016-05-16 DIAGNOSIS — J9600 Acute respiratory failure, unspecified whether with hypoxia or hypercapnia: Secondary | ICD-10-CM | POA: Diagnosis not present

## 2016-05-16 DIAGNOSIS — G8929 Other chronic pain: Secondary | ICD-10-CM | POA: Diagnosis not present

## 2016-05-17 DIAGNOSIS — G8929 Other chronic pain: Secondary | ICD-10-CM | POA: Diagnosis not present

## 2016-05-17 DIAGNOSIS — J9600 Acute respiratory failure, unspecified whether with hypoxia or hypercapnia: Secondary | ICD-10-CM | POA: Diagnosis not present

## 2016-05-17 DIAGNOSIS — M6281 Muscle weakness (generalized): Secondary | ICD-10-CM | POA: Diagnosis not present

## 2016-05-17 DIAGNOSIS — J189 Pneumonia, unspecified organism: Secondary | ICD-10-CM | POA: Diagnosis not present

## 2016-05-18 DIAGNOSIS — M6281 Muscle weakness (generalized): Secondary | ICD-10-CM | POA: Diagnosis not present

## 2016-05-18 DIAGNOSIS — J189 Pneumonia, unspecified organism: Secondary | ICD-10-CM | POA: Diagnosis not present

## 2016-05-18 DIAGNOSIS — G8929 Other chronic pain: Secondary | ICD-10-CM | POA: Diagnosis not present

## 2016-05-18 DIAGNOSIS — J9600 Acute respiratory failure, unspecified whether with hypoxia or hypercapnia: Secondary | ICD-10-CM | POA: Diagnosis not present

## 2016-05-21 DIAGNOSIS — G8929 Other chronic pain: Secondary | ICD-10-CM | POA: Diagnosis not present

## 2016-05-21 DIAGNOSIS — J189 Pneumonia, unspecified organism: Secondary | ICD-10-CM | POA: Diagnosis not present

## 2016-05-21 DIAGNOSIS — M6281 Muscle weakness (generalized): Secondary | ICD-10-CM | POA: Diagnosis not present

## 2016-05-21 DIAGNOSIS — J9600 Acute respiratory failure, unspecified whether with hypoxia or hypercapnia: Secondary | ICD-10-CM | POA: Diagnosis not present

## 2016-05-22 DIAGNOSIS — J189 Pneumonia, unspecified organism: Secondary | ICD-10-CM | POA: Diagnosis not present

## 2016-05-22 DIAGNOSIS — J9600 Acute respiratory failure, unspecified whether with hypoxia or hypercapnia: Secondary | ICD-10-CM | POA: Diagnosis not present

## 2016-05-22 DIAGNOSIS — M6281 Muscle weakness (generalized): Secondary | ICD-10-CM | POA: Diagnosis not present

## 2016-05-22 DIAGNOSIS — G8929 Other chronic pain: Secondary | ICD-10-CM | POA: Diagnosis not present

## 2016-05-24 DIAGNOSIS — G8929 Other chronic pain: Secondary | ICD-10-CM | POA: Diagnosis not present

## 2016-05-24 DIAGNOSIS — J9600 Acute respiratory failure, unspecified whether with hypoxia or hypercapnia: Secondary | ICD-10-CM | POA: Diagnosis not present

## 2016-05-24 DIAGNOSIS — M6281 Muscle weakness (generalized): Secondary | ICD-10-CM | POA: Diagnosis not present

## 2016-05-24 DIAGNOSIS — J189 Pneumonia, unspecified organism: Secondary | ICD-10-CM | POA: Diagnosis not present

## 2016-05-25 DIAGNOSIS — G8929 Other chronic pain: Secondary | ICD-10-CM | POA: Diagnosis not present

## 2016-05-25 DIAGNOSIS — J189 Pneumonia, unspecified organism: Secondary | ICD-10-CM | POA: Diagnosis not present

## 2016-05-25 DIAGNOSIS — M6281 Muscle weakness (generalized): Secondary | ICD-10-CM | POA: Diagnosis not present

## 2016-05-25 DIAGNOSIS — J9600 Acute respiratory failure, unspecified whether with hypoxia or hypercapnia: Secondary | ICD-10-CM | POA: Diagnosis not present

## 2016-05-26 DIAGNOSIS — G8929 Other chronic pain: Secondary | ICD-10-CM | POA: Diagnosis not present

## 2016-05-26 DIAGNOSIS — J189 Pneumonia, unspecified organism: Secondary | ICD-10-CM | POA: Diagnosis not present

## 2016-05-26 DIAGNOSIS — M6281 Muscle weakness (generalized): Secondary | ICD-10-CM | POA: Diagnosis not present

## 2016-05-26 DIAGNOSIS — J9600 Acute respiratory failure, unspecified whether with hypoxia or hypercapnia: Secondary | ICD-10-CM | POA: Diagnosis not present

## 2016-05-29 DIAGNOSIS — M6281 Muscle weakness (generalized): Secondary | ICD-10-CM | POA: Diagnosis not present

## 2016-05-29 DIAGNOSIS — J189 Pneumonia, unspecified organism: Secondary | ICD-10-CM | POA: Diagnosis not present

## 2016-05-29 DIAGNOSIS — J9600 Acute respiratory failure, unspecified whether with hypoxia or hypercapnia: Secondary | ICD-10-CM | POA: Diagnosis not present

## 2016-05-29 DIAGNOSIS — G8929 Other chronic pain: Secondary | ICD-10-CM | POA: Diagnosis not present

## 2016-05-30 DIAGNOSIS — M6281 Muscle weakness (generalized): Secondary | ICD-10-CM | POA: Diagnosis not present

## 2016-05-30 DIAGNOSIS — J9600 Acute respiratory failure, unspecified whether with hypoxia or hypercapnia: Secondary | ICD-10-CM | POA: Diagnosis not present

## 2016-05-30 DIAGNOSIS — G8929 Other chronic pain: Secondary | ICD-10-CM | POA: Diagnosis not present

## 2016-05-30 DIAGNOSIS — J189 Pneumonia, unspecified organism: Secondary | ICD-10-CM | POA: Diagnosis not present

## 2016-05-31 DIAGNOSIS — Z89611 Acquired absence of right leg above knee: Secondary | ICD-10-CM | POA: Diagnosis not present

## 2016-05-31 DIAGNOSIS — G8929 Other chronic pain: Secondary | ICD-10-CM | POA: Diagnosis not present

## 2016-05-31 DIAGNOSIS — I4891 Unspecified atrial fibrillation: Secondary | ICD-10-CM | POA: Diagnosis not present

## 2016-05-31 DIAGNOSIS — I2510 Atherosclerotic heart disease of native coronary artery without angina pectoris: Secondary | ICD-10-CM | POA: Diagnosis not present

## 2016-05-31 DIAGNOSIS — J189 Pneumonia, unspecified organism: Secondary | ICD-10-CM | POA: Diagnosis not present

## 2016-05-31 DIAGNOSIS — Z89612 Acquired absence of left leg above knee: Secondary | ICD-10-CM | POA: Diagnosis not present

## 2016-05-31 DIAGNOSIS — B1920 Unspecified viral hepatitis C without hepatic coma: Secondary | ICD-10-CM | POA: Diagnosis not present

## 2016-05-31 DIAGNOSIS — I10 Essential (primary) hypertension: Secondary | ICD-10-CM | POA: Diagnosis not present

## 2016-05-31 DIAGNOSIS — J9600 Acute respiratory failure, unspecified whether with hypoxia or hypercapnia: Secondary | ICD-10-CM | POA: Diagnosis not present

## 2016-05-31 DIAGNOSIS — I739 Peripheral vascular disease, unspecified: Secondary | ICD-10-CM | POA: Diagnosis not present

## 2016-05-31 DIAGNOSIS — F209 Schizophrenia, unspecified: Secondary | ICD-10-CM | POA: Diagnosis not present

## 2016-05-31 DIAGNOSIS — M6281 Muscle weakness (generalized): Secondary | ICD-10-CM | POA: Diagnosis not present

## 2016-06-01 DIAGNOSIS — J9600 Acute respiratory failure, unspecified whether with hypoxia or hypercapnia: Secondary | ICD-10-CM | POA: Diagnosis not present

## 2016-06-01 DIAGNOSIS — M6281 Muscle weakness (generalized): Secondary | ICD-10-CM | POA: Diagnosis not present

## 2016-06-01 DIAGNOSIS — J189 Pneumonia, unspecified organism: Secondary | ICD-10-CM | POA: Diagnosis not present

## 2016-06-01 DIAGNOSIS — G8929 Other chronic pain: Secondary | ICD-10-CM | POA: Diagnosis not present

## 2016-06-02 DIAGNOSIS — J189 Pneumonia, unspecified organism: Secondary | ICD-10-CM | POA: Diagnosis not present

## 2016-06-02 DIAGNOSIS — J9600 Acute respiratory failure, unspecified whether with hypoxia or hypercapnia: Secondary | ICD-10-CM | POA: Diagnosis not present

## 2016-06-02 DIAGNOSIS — M6281 Muscle weakness (generalized): Secondary | ICD-10-CM | POA: Diagnosis not present

## 2016-06-02 DIAGNOSIS — G8929 Other chronic pain: Secondary | ICD-10-CM | POA: Diagnosis not present

## 2016-06-05 DIAGNOSIS — J9600 Acute respiratory failure, unspecified whether with hypoxia or hypercapnia: Secondary | ICD-10-CM | POA: Diagnosis not present

## 2016-06-05 DIAGNOSIS — J189 Pneumonia, unspecified organism: Secondary | ICD-10-CM | POA: Diagnosis not present

## 2016-06-05 DIAGNOSIS — G8929 Other chronic pain: Secondary | ICD-10-CM | POA: Diagnosis not present

## 2016-06-05 DIAGNOSIS — M6281 Muscle weakness (generalized): Secondary | ICD-10-CM | POA: Diagnosis not present

## 2016-06-06 DIAGNOSIS — M6281 Muscle weakness (generalized): Secondary | ICD-10-CM | POA: Diagnosis not present

## 2016-06-06 DIAGNOSIS — J189 Pneumonia, unspecified organism: Secondary | ICD-10-CM | POA: Diagnosis not present

## 2016-06-06 DIAGNOSIS — J9600 Acute respiratory failure, unspecified whether with hypoxia or hypercapnia: Secondary | ICD-10-CM | POA: Diagnosis not present

## 2016-06-06 DIAGNOSIS — G8929 Other chronic pain: Secondary | ICD-10-CM | POA: Diagnosis not present

## 2016-06-07 DIAGNOSIS — J9600 Acute respiratory failure, unspecified whether with hypoxia or hypercapnia: Secondary | ICD-10-CM | POA: Diagnosis not present

## 2016-06-07 DIAGNOSIS — J189 Pneumonia, unspecified organism: Secondary | ICD-10-CM | POA: Diagnosis not present

## 2016-06-07 DIAGNOSIS — G8929 Other chronic pain: Secondary | ICD-10-CM | POA: Diagnosis not present

## 2016-06-07 DIAGNOSIS — M6281 Muscle weakness (generalized): Secondary | ICD-10-CM | POA: Diagnosis not present

## 2016-06-08 DIAGNOSIS — J9600 Acute respiratory failure, unspecified whether with hypoxia or hypercapnia: Secondary | ICD-10-CM | POA: Diagnosis not present

## 2016-06-08 DIAGNOSIS — G8929 Other chronic pain: Secondary | ICD-10-CM | POA: Diagnosis not present

## 2016-06-08 DIAGNOSIS — J189 Pneumonia, unspecified organism: Secondary | ICD-10-CM | POA: Diagnosis not present

## 2016-06-08 DIAGNOSIS — M6281 Muscle weakness (generalized): Secondary | ICD-10-CM | POA: Diagnosis not present

## 2016-06-09 DIAGNOSIS — J189 Pneumonia, unspecified organism: Secondary | ICD-10-CM | POA: Diagnosis not present

## 2016-06-09 DIAGNOSIS — G8929 Other chronic pain: Secondary | ICD-10-CM | POA: Diagnosis not present

## 2016-06-09 DIAGNOSIS — J9600 Acute respiratory failure, unspecified whether with hypoxia or hypercapnia: Secondary | ICD-10-CM | POA: Diagnosis not present

## 2016-06-09 DIAGNOSIS — M6281 Muscle weakness (generalized): Secondary | ICD-10-CM | POA: Diagnosis not present

## 2016-06-11 DIAGNOSIS — M6281 Muscle weakness (generalized): Secondary | ICD-10-CM | POA: Diagnosis not present

## 2016-06-11 DIAGNOSIS — J9600 Acute respiratory failure, unspecified whether with hypoxia or hypercapnia: Secondary | ICD-10-CM | POA: Diagnosis not present

## 2016-06-11 DIAGNOSIS — G8929 Other chronic pain: Secondary | ICD-10-CM | POA: Diagnosis not present

## 2016-06-11 DIAGNOSIS — J189 Pneumonia, unspecified organism: Secondary | ICD-10-CM | POA: Diagnosis not present

## 2016-06-12 DIAGNOSIS — J9600 Acute respiratory failure, unspecified whether with hypoxia or hypercapnia: Secondary | ICD-10-CM | POA: Diagnosis not present

## 2016-06-12 DIAGNOSIS — M6281 Muscle weakness (generalized): Secondary | ICD-10-CM | POA: Diagnosis not present

## 2016-06-12 DIAGNOSIS — G8929 Other chronic pain: Secondary | ICD-10-CM | POA: Diagnosis not present

## 2016-06-12 DIAGNOSIS — J189 Pneumonia, unspecified organism: Secondary | ICD-10-CM | POA: Diagnosis not present

## 2016-06-13 DIAGNOSIS — M6281 Muscle weakness (generalized): Secondary | ICD-10-CM | POA: Diagnosis not present

## 2016-06-13 DIAGNOSIS — J9600 Acute respiratory failure, unspecified whether with hypoxia or hypercapnia: Secondary | ICD-10-CM | POA: Diagnosis not present

## 2016-06-13 DIAGNOSIS — G8929 Other chronic pain: Secondary | ICD-10-CM | POA: Diagnosis not present

## 2016-06-13 DIAGNOSIS — J189 Pneumonia, unspecified organism: Secondary | ICD-10-CM | POA: Diagnosis not present

## 2016-06-14 DIAGNOSIS — J9600 Acute respiratory failure, unspecified whether with hypoxia or hypercapnia: Secondary | ICD-10-CM | POA: Diagnosis not present

## 2016-06-14 DIAGNOSIS — G8929 Other chronic pain: Secondary | ICD-10-CM | POA: Diagnosis not present

## 2016-06-14 DIAGNOSIS — J189 Pneumonia, unspecified organism: Secondary | ICD-10-CM | POA: Diagnosis not present

## 2016-06-14 DIAGNOSIS — M6281 Muscle weakness (generalized): Secondary | ICD-10-CM | POA: Diagnosis not present

## 2016-06-16 DIAGNOSIS — G8929 Other chronic pain: Secondary | ICD-10-CM | POA: Diagnosis not present

## 2016-06-16 DIAGNOSIS — J9600 Acute respiratory failure, unspecified whether with hypoxia or hypercapnia: Secondary | ICD-10-CM | POA: Diagnosis not present

## 2016-06-16 DIAGNOSIS — J189 Pneumonia, unspecified organism: Secondary | ICD-10-CM | POA: Diagnosis not present

## 2016-06-16 DIAGNOSIS — M6281 Muscle weakness (generalized): Secondary | ICD-10-CM | POA: Diagnosis not present

## 2016-06-18 DIAGNOSIS — M6281 Muscle weakness (generalized): Secondary | ICD-10-CM | POA: Diagnosis not present

## 2016-06-18 DIAGNOSIS — G8929 Other chronic pain: Secondary | ICD-10-CM | POA: Diagnosis not present

## 2016-06-18 DIAGNOSIS — J9600 Acute respiratory failure, unspecified whether with hypoxia or hypercapnia: Secondary | ICD-10-CM | POA: Diagnosis not present

## 2016-06-18 DIAGNOSIS — J189 Pneumonia, unspecified organism: Secondary | ICD-10-CM | POA: Diagnosis not present

## 2016-06-19 DIAGNOSIS — G8929 Other chronic pain: Secondary | ICD-10-CM | POA: Diagnosis not present

## 2016-06-19 DIAGNOSIS — J9600 Acute respiratory failure, unspecified whether with hypoxia or hypercapnia: Secondary | ICD-10-CM | POA: Diagnosis not present

## 2016-06-19 DIAGNOSIS — J189 Pneumonia, unspecified organism: Secondary | ICD-10-CM | POA: Diagnosis not present

## 2016-06-19 DIAGNOSIS — M6281 Muscle weakness (generalized): Secondary | ICD-10-CM | POA: Diagnosis not present

## 2016-06-20 DIAGNOSIS — G8929 Other chronic pain: Secondary | ICD-10-CM | POA: Diagnosis not present

## 2016-06-20 DIAGNOSIS — J9600 Acute respiratory failure, unspecified whether with hypoxia or hypercapnia: Secondary | ICD-10-CM | POA: Diagnosis not present

## 2016-06-20 DIAGNOSIS — J189 Pneumonia, unspecified organism: Secondary | ICD-10-CM | POA: Diagnosis not present

## 2016-06-20 DIAGNOSIS — M6281 Muscle weakness (generalized): Secondary | ICD-10-CM | POA: Diagnosis not present

## 2016-06-21 DIAGNOSIS — M6281 Muscle weakness (generalized): Secondary | ICD-10-CM | POA: Diagnosis not present

## 2016-06-21 DIAGNOSIS — G8929 Other chronic pain: Secondary | ICD-10-CM | POA: Diagnosis not present

## 2016-06-21 DIAGNOSIS — J9600 Acute respiratory failure, unspecified whether with hypoxia or hypercapnia: Secondary | ICD-10-CM | POA: Diagnosis not present

## 2016-06-21 DIAGNOSIS — J189 Pneumonia, unspecified organism: Secondary | ICD-10-CM | POA: Diagnosis not present

## 2016-06-22 DIAGNOSIS — G8929 Other chronic pain: Secondary | ICD-10-CM | POA: Diagnosis not present

## 2016-06-22 DIAGNOSIS — M6281 Muscle weakness (generalized): Secondary | ICD-10-CM | POA: Diagnosis not present

## 2016-06-22 DIAGNOSIS — J9600 Acute respiratory failure, unspecified whether with hypoxia or hypercapnia: Secondary | ICD-10-CM | POA: Diagnosis not present

## 2016-06-22 DIAGNOSIS — J189 Pneumonia, unspecified organism: Secondary | ICD-10-CM | POA: Diagnosis not present

## 2016-06-25 DIAGNOSIS — J9600 Acute respiratory failure, unspecified whether with hypoxia or hypercapnia: Secondary | ICD-10-CM | POA: Diagnosis not present

## 2016-06-25 DIAGNOSIS — J189 Pneumonia, unspecified organism: Secondary | ICD-10-CM | POA: Diagnosis not present

## 2016-06-25 DIAGNOSIS — G8929 Other chronic pain: Secondary | ICD-10-CM | POA: Diagnosis not present

## 2016-06-25 DIAGNOSIS — M6281 Muscle weakness (generalized): Secondary | ICD-10-CM | POA: Diagnosis not present

## 2016-06-27 DIAGNOSIS — J189 Pneumonia, unspecified organism: Secondary | ICD-10-CM | POA: Diagnosis not present

## 2016-06-27 DIAGNOSIS — G8929 Other chronic pain: Secondary | ICD-10-CM | POA: Diagnosis not present

## 2016-06-27 DIAGNOSIS — M6281 Muscle weakness (generalized): Secondary | ICD-10-CM | POA: Diagnosis not present

## 2016-06-27 DIAGNOSIS — J9600 Acute respiratory failure, unspecified whether with hypoxia or hypercapnia: Secondary | ICD-10-CM | POA: Diagnosis not present

## 2016-06-28 DIAGNOSIS — J189 Pneumonia, unspecified organism: Secondary | ICD-10-CM | POA: Diagnosis not present

## 2016-06-28 DIAGNOSIS — M6281 Muscle weakness (generalized): Secondary | ICD-10-CM | POA: Diagnosis not present

## 2016-06-28 DIAGNOSIS — G8929 Other chronic pain: Secondary | ICD-10-CM | POA: Diagnosis not present

## 2016-06-28 DIAGNOSIS — J9600 Acute respiratory failure, unspecified whether with hypoxia or hypercapnia: Secondary | ICD-10-CM | POA: Diagnosis not present

## 2016-06-29 DIAGNOSIS — J189 Pneumonia, unspecified organism: Secondary | ICD-10-CM | POA: Diagnosis not present

## 2016-06-29 DIAGNOSIS — J9600 Acute respiratory failure, unspecified whether with hypoxia or hypercapnia: Secondary | ICD-10-CM | POA: Diagnosis not present

## 2016-06-29 DIAGNOSIS — M6281 Muscle weakness (generalized): Secondary | ICD-10-CM | POA: Diagnosis not present

## 2016-06-29 DIAGNOSIS — G8929 Other chronic pain: Secondary | ICD-10-CM | POA: Diagnosis not present

## 2016-06-30 DIAGNOSIS — J189 Pneumonia, unspecified organism: Secondary | ICD-10-CM | POA: Diagnosis not present

## 2016-06-30 DIAGNOSIS — M6281 Muscle weakness (generalized): Secondary | ICD-10-CM | POA: Diagnosis not present

## 2016-06-30 DIAGNOSIS — G8929 Other chronic pain: Secondary | ICD-10-CM | POA: Diagnosis not present

## 2016-06-30 DIAGNOSIS — J9600 Acute respiratory failure, unspecified whether with hypoxia or hypercapnia: Secondary | ICD-10-CM | POA: Diagnosis not present

## 2016-07-02 DIAGNOSIS — J189 Pneumonia, unspecified organism: Secondary | ICD-10-CM | POA: Diagnosis not present

## 2016-07-02 DIAGNOSIS — J9600 Acute respiratory failure, unspecified whether with hypoxia or hypercapnia: Secondary | ICD-10-CM | POA: Diagnosis not present

## 2016-07-02 DIAGNOSIS — M6281 Muscle weakness (generalized): Secondary | ICD-10-CM | POA: Diagnosis not present

## 2016-07-02 DIAGNOSIS — G8929 Other chronic pain: Secondary | ICD-10-CM | POA: Diagnosis not present

## 2016-07-03 DIAGNOSIS — I4891 Unspecified atrial fibrillation: Secondary | ICD-10-CM | POA: Diagnosis not present

## 2016-07-03 DIAGNOSIS — K089 Disorder of teeth and supporting structures, unspecified: Secondary | ICD-10-CM | POA: Diagnosis not present

## 2016-07-03 DIAGNOSIS — I739 Peripheral vascular disease, unspecified: Secondary | ICD-10-CM | POA: Diagnosis not present

## 2016-07-03 DIAGNOSIS — F209 Schizophrenia, unspecified: Secondary | ICD-10-CM | POA: Diagnosis not present

## 2016-07-03 DIAGNOSIS — J9600 Acute respiratory failure, unspecified whether with hypoxia or hypercapnia: Secondary | ICD-10-CM | POA: Diagnosis not present

## 2016-07-03 DIAGNOSIS — J189 Pneumonia, unspecified organism: Secondary | ICD-10-CM | POA: Diagnosis not present

## 2016-07-03 DIAGNOSIS — I10 Essential (primary) hypertension: Secondary | ICD-10-CM | POA: Diagnosis not present

## 2016-07-03 DIAGNOSIS — M6281 Muscle weakness (generalized): Secondary | ICD-10-CM | POA: Diagnosis not present

## 2016-07-03 DIAGNOSIS — G8929 Other chronic pain: Secondary | ICD-10-CM | POA: Diagnosis not present

## 2016-07-03 DIAGNOSIS — I2510 Atherosclerotic heart disease of native coronary artery without angina pectoris: Secondary | ICD-10-CM | POA: Diagnosis not present

## 2016-07-03 DIAGNOSIS — Z89612 Acquired absence of left leg above knee: Secondary | ICD-10-CM | POA: Diagnosis not present

## 2016-07-03 DIAGNOSIS — Z89611 Acquired absence of right leg above knee: Secondary | ICD-10-CM | POA: Diagnosis not present

## 2016-07-03 DIAGNOSIS — B1920 Unspecified viral hepatitis C without hepatic coma: Secondary | ICD-10-CM | POA: Diagnosis not present

## 2016-07-04 DIAGNOSIS — M6281 Muscle weakness (generalized): Secondary | ICD-10-CM | POA: Diagnosis not present

## 2016-07-04 DIAGNOSIS — G8929 Other chronic pain: Secondary | ICD-10-CM | POA: Diagnosis not present

## 2016-07-04 DIAGNOSIS — J189 Pneumonia, unspecified organism: Secondary | ICD-10-CM | POA: Diagnosis not present

## 2016-07-04 DIAGNOSIS — J9600 Acute respiratory failure, unspecified whether with hypoxia or hypercapnia: Secondary | ICD-10-CM | POA: Diagnosis not present

## 2016-07-05 DIAGNOSIS — F250 Schizoaffective disorder, bipolar type: Secondary | ICD-10-CM | POA: Diagnosis not present

## 2016-07-05 DIAGNOSIS — J189 Pneumonia, unspecified organism: Secondary | ICD-10-CM | POA: Diagnosis not present

## 2016-07-05 DIAGNOSIS — G8929 Other chronic pain: Secondary | ICD-10-CM | POA: Diagnosis not present

## 2016-07-05 DIAGNOSIS — F329 Major depressive disorder, single episode, unspecified: Secondary | ICD-10-CM | POA: Diagnosis not present

## 2016-07-05 DIAGNOSIS — G4700 Insomnia, unspecified: Secondary | ICD-10-CM | POA: Diagnosis not present

## 2016-07-05 DIAGNOSIS — F419 Anxiety disorder, unspecified: Secondary | ICD-10-CM | POA: Diagnosis not present

## 2016-07-06 DIAGNOSIS — G8929 Other chronic pain: Secondary | ICD-10-CM | POA: Diagnosis not present

## 2016-07-06 DIAGNOSIS — J189 Pneumonia, unspecified organism: Secondary | ICD-10-CM | POA: Diagnosis not present

## 2016-07-09 DIAGNOSIS — F331 Major depressive disorder, recurrent, moderate: Secondary | ICD-10-CM | POA: Diagnosis not present

## 2016-07-10 DIAGNOSIS — G8929 Other chronic pain: Secondary | ICD-10-CM | POA: Diagnosis not present

## 2016-07-10 DIAGNOSIS — J189 Pneumonia, unspecified organism: Secondary | ICD-10-CM | POA: Diagnosis not present

## 2016-07-11 DIAGNOSIS — G8929 Other chronic pain: Secondary | ICD-10-CM | POA: Diagnosis not present

## 2016-07-11 DIAGNOSIS — J189 Pneumonia, unspecified organism: Secondary | ICD-10-CM | POA: Diagnosis not present

## 2016-07-12 DIAGNOSIS — J189 Pneumonia, unspecified organism: Secondary | ICD-10-CM | POA: Diagnosis not present

## 2016-07-12 DIAGNOSIS — G8929 Other chronic pain: Secondary | ICD-10-CM | POA: Diagnosis not present

## 2016-07-13 DIAGNOSIS — J189 Pneumonia, unspecified organism: Secondary | ICD-10-CM | POA: Diagnosis not present

## 2016-07-13 DIAGNOSIS — G8929 Other chronic pain: Secondary | ICD-10-CM | POA: Diagnosis not present

## 2016-07-16 DIAGNOSIS — J189 Pneumonia, unspecified organism: Secondary | ICD-10-CM | POA: Diagnosis not present

## 2016-07-16 DIAGNOSIS — G8929 Other chronic pain: Secondary | ICD-10-CM | POA: Diagnosis not present

## 2016-07-17 DIAGNOSIS — J189 Pneumonia, unspecified organism: Secondary | ICD-10-CM | POA: Diagnosis not present

## 2016-07-17 DIAGNOSIS — G8929 Other chronic pain: Secondary | ICD-10-CM | POA: Diagnosis not present

## 2016-07-18 DIAGNOSIS — G8929 Other chronic pain: Secondary | ICD-10-CM | POA: Diagnosis not present

## 2016-07-18 DIAGNOSIS — J189 Pneumonia, unspecified organism: Secondary | ICD-10-CM | POA: Diagnosis not present

## 2016-07-19 DIAGNOSIS — T8789 Other complications of amputation stump: Secondary | ICD-10-CM | POA: Diagnosis not present

## 2016-07-19 DIAGNOSIS — L259 Unspecified contact dermatitis, unspecified cause: Secondary | ICD-10-CM | POA: Diagnosis not present

## 2016-07-21 DIAGNOSIS — J189 Pneumonia, unspecified organism: Secondary | ICD-10-CM | POA: Diagnosis not present

## 2016-07-21 DIAGNOSIS — G8929 Other chronic pain: Secondary | ICD-10-CM | POA: Diagnosis not present

## 2016-07-23 DIAGNOSIS — G8929 Other chronic pain: Secondary | ICD-10-CM | POA: Diagnosis not present

## 2016-07-23 DIAGNOSIS — J189 Pneumonia, unspecified organism: Secondary | ICD-10-CM | POA: Diagnosis not present

## 2016-07-24 DIAGNOSIS — J189 Pneumonia, unspecified organism: Secondary | ICD-10-CM | POA: Diagnosis not present

## 2016-07-24 DIAGNOSIS — G8929 Other chronic pain: Secondary | ICD-10-CM | POA: Diagnosis not present

## 2016-07-25 DIAGNOSIS — G8929 Other chronic pain: Secondary | ICD-10-CM | POA: Diagnosis not present

## 2016-07-25 DIAGNOSIS — J189 Pneumonia, unspecified organism: Secondary | ICD-10-CM | POA: Diagnosis not present

## 2016-07-26 DIAGNOSIS — I739 Peripheral vascular disease, unspecified: Secondary | ICD-10-CM | POA: Diagnosis not present

## 2016-07-26 DIAGNOSIS — Z89611 Acquired absence of right leg above knee: Secondary | ICD-10-CM | POA: Diagnosis not present

## 2016-07-26 DIAGNOSIS — B1920 Unspecified viral hepatitis C without hepatic coma: Secondary | ICD-10-CM | POA: Diagnosis not present

## 2016-07-26 DIAGNOSIS — I10 Essential (primary) hypertension: Secondary | ICD-10-CM | POA: Diagnosis not present

## 2016-07-26 DIAGNOSIS — G8929 Other chronic pain: Secondary | ICD-10-CM | POA: Diagnosis not present

## 2016-07-26 DIAGNOSIS — J189 Pneumonia, unspecified organism: Secondary | ICD-10-CM | POA: Diagnosis not present

## 2016-07-26 DIAGNOSIS — I2510 Atherosclerotic heart disease of native coronary artery without angina pectoris: Secondary | ICD-10-CM | POA: Diagnosis not present

## 2016-07-26 DIAGNOSIS — I4891 Unspecified atrial fibrillation: Secondary | ICD-10-CM | POA: Diagnosis not present

## 2016-07-26 DIAGNOSIS — M47816 Spondylosis without myelopathy or radiculopathy, lumbar region: Secondary | ICD-10-CM | POA: Diagnosis not present

## 2016-07-26 DIAGNOSIS — Z89612 Acquired absence of left leg above knee: Secondary | ICD-10-CM | POA: Diagnosis not present

## 2016-07-26 DIAGNOSIS — F209 Schizophrenia, unspecified: Secondary | ICD-10-CM | POA: Diagnosis not present

## 2016-07-27 DIAGNOSIS — J189 Pneumonia, unspecified organism: Secondary | ICD-10-CM | POA: Diagnosis not present

## 2016-07-27 DIAGNOSIS — G8929 Other chronic pain: Secondary | ICD-10-CM | POA: Diagnosis not present

## 2016-07-28 DIAGNOSIS — G8929 Other chronic pain: Secondary | ICD-10-CM | POA: Diagnosis not present

## 2016-07-28 DIAGNOSIS — J189 Pneumonia, unspecified organism: Secondary | ICD-10-CM | POA: Diagnosis not present

## 2016-07-31 DIAGNOSIS — G8929 Other chronic pain: Secondary | ICD-10-CM | POA: Diagnosis not present

## 2016-07-31 DIAGNOSIS — J189 Pneumonia, unspecified organism: Secondary | ICD-10-CM | POA: Diagnosis not present

## 2016-08-02 DIAGNOSIS — J189 Pneumonia, unspecified organism: Secondary | ICD-10-CM | POA: Diagnosis not present

## 2016-08-02 DIAGNOSIS — G8929 Other chronic pain: Secondary | ICD-10-CM | POA: Diagnosis not present

## 2016-08-03 DIAGNOSIS — J189 Pneumonia, unspecified organism: Secondary | ICD-10-CM | POA: Diagnosis not present

## 2016-08-03 DIAGNOSIS — G8929 Other chronic pain: Secondary | ICD-10-CM | POA: Diagnosis not present

## 2016-08-04 DIAGNOSIS — G8929 Other chronic pain: Secondary | ICD-10-CM | POA: Diagnosis not present

## 2016-08-04 DIAGNOSIS — J189 Pneumonia, unspecified organism: Secondary | ICD-10-CM | POA: Diagnosis not present

## 2016-08-06 DIAGNOSIS — J189 Pneumonia, unspecified organism: Secondary | ICD-10-CM | POA: Diagnosis not present

## 2016-08-06 DIAGNOSIS — M6281 Muscle weakness (generalized): Secondary | ICD-10-CM | POA: Diagnosis not present

## 2016-08-06 DIAGNOSIS — J9600 Acute respiratory failure, unspecified whether with hypoxia or hypercapnia: Secondary | ICD-10-CM | POA: Diagnosis not present

## 2016-08-06 DIAGNOSIS — G8929 Other chronic pain: Secondary | ICD-10-CM | POA: Diagnosis not present

## 2016-08-07 DIAGNOSIS — M6281 Muscle weakness (generalized): Secondary | ICD-10-CM | POA: Diagnosis not present

## 2016-08-07 DIAGNOSIS — G8929 Other chronic pain: Secondary | ICD-10-CM | POA: Diagnosis not present

## 2016-08-07 DIAGNOSIS — J9600 Acute respiratory failure, unspecified whether with hypoxia or hypercapnia: Secondary | ICD-10-CM | POA: Diagnosis not present

## 2016-08-07 DIAGNOSIS — J189 Pneumonia, unspecified organism: Secondary | ICD-10-CM | POA: Diagnosis not present

## 2016-08-08 DIAGNOSIS — G8929 Other chronic pain: Secondary | ICD-10-CM | POA: Diagnosis not present

## 2016-08-08 DIAGNOSIS — J189 Pneumonia, unspecified organism: Secondary | ICD-10-CM | POA: Diagnosis not present

## 2016-08-08 DIAGNOSIS — M6281 Muscle weakness (generalized): Secondary | ICD-10-CM | POA: Diagnosis not present

## 2016-08-08 DIAGNOSIS — J9600 Acute respiratory failure, unspecified whether with hypoxia or hypercapnia: Secondary | ICD-10-CM | POA: Diagnosis not present

## 2016-08-09 DIAGNOSIS — M6281 Muscle weakness (generalized): Secondary | ICD-10-CM | POA: Diagnosis not present

## 2016-08-09 DIAGNOSIS — J9600 Acute respiratory failure, unspecified whether with hypoxia or hypercapnia: Secondary | ICD-10-CM | POA: Diagnosis not present

## 2016-08-09 DIAGNOSIS — G8929 Other chronic pain: Secondary | ICD-10-CM | POA: Diagnosis not present

## 2016-08-09 DIAGNOSIS — J189 Pneumonia, unspecified organism: Secondary | ICD-10-CM | POA: Diagnosis not present

## 2016-08-10 DIAGNOSIS — G8929 Other chronic pain: Secondary | ICD-10-CM | POA: Diagnosis not present

## 2016-08-10 DIAGNOSIS — J189 Pneumonia, unspecified organism: Secondary | ICD-10-CM | POA: Diagnosis not present

## 2016-08-10 DIAGNOSIS — M6281 Muscle weakness (generalized): Secondary | ICD-10-CM | POA: Diagnosis not present

## 2016-08-10 DIAGNOSIS — J9600 Acute respiratory failure, unspecified whether with hypoxia or hypercapnia: Secondary | ICD-10-CM | POA: Diagnosis not present

## 2016-08-13 DIAGNOSIS — G8929 Other chronic pain: Secondary | ICD-10-CM | POA: Diagnosis not present

## 2016-08-13 DIAGNOSIS — J189 Pneumonia, unspecified organism: Secondary | ICD-10-CM | POA: Diagnosis not present

## 2016-08-13 DIAGNOSIS — J9600 Acute respiratory failure, unspecified whether with hypoxia or hypercapnia: Secondary | ICD-10-CM | POA: Diagnosis not present

## 2016-08-13 DIAGNOSIS — M6281 Muscle weakness (generalized): Secondary | ICD-10-CM | POA: Diagnosis not present

## 2016-08-14 DIAGNOSIS — J189 Pneumonia, unspecified organism: Secondary | ICD-10-CM | POA: Diagnosis not present

## 2016-08-14 DIAGNOSIS — M6281 Muscle weakness (generalized): Secondary | ICD-10-CM | POA: Diagnosis not present

## 2016-08-14 DIAGNOSIS — G8929 Other chronic pain: Secondary | ICD-10-CM | POA: Diagnosis not present

## 2016-08-14 DIAGNOSIS — J9600 Acute respiratory failure, unspecified whether with hypoxia or hypercapnia: Secondary | ICD-10-CM | POA: Diagnosis not present

## 2016-08-15 DIAGNOSIS — M6281 Muscle weakness (generalized): Secondary | ICD-10-CM | POA: Diagnosis not present

## 2016-08-15 DIAGNOSIS — J9600 Acute respiratory failure, unspecified whether with hypoxia or hypercapnia: Secondary | ICD-10-CM | POA: Diagnosis not present

## 2016-08-15 DIAGNOSIS — G8929 Other chronic pain: Secondary | ICD-10-CM | POA: Diagnosis not present

## 2016-08-15 DIAGNOSIS — J189 Pneumonia, unspecified organism: Secondary | ICD-10-CM | POA: Diagnosis not present

## 2016-08-17 DIAGNOSIS — J9600 Acute respiratory failure, unspecified whether with hypoxia or hypercapnia: Secondary | ICD-10-CM | POA: Diagnosis not present

## 2016-08-17 DIAGNOSIS — G8929 Other chronic pain: Secondary | ICD-10-CM | POA: Diagnosis not present

## 2016-08-17 DIAGNOSIS — M6281 Muscle weakness (generalized): Secondary | ICD-10-CM | POA: Diagnosis not present

## 2016-08-17 DIAGNOSIS — J189 Pneumonia, unspecified organism: Secondary | ICD-10-CM | POA: Diagnosis not present

## 2016-08-18 DIAGNOSIS — J9600 Acute respiratory failure, unspecified whether with hypoxia or hypercapnia: Secondary | ICD-10-CM | POA: Diagnosis not present

## 2016-08-18 DIAGNOSIS — M6281 Muscle weakness (generalized): Secondary | ICD-10-CM | POA: Diagnosis not present

## 2016-08-18 DIAGNOSIS — J189 Pneumonia, unspecified organism: Secondary | ICD-10-CM | POA: Diagnosis not present

## 2016-08-18 DIAGNOSIS — G8929 Other chronic pain: Secondary | ICD-10-CM | POA: Diagnosis not present

## 2016-08-21 DIAGNOSIS — M6281 Muscle weakness (generalized): Secondary | ICD-10-CM | POA: Diagnosis not present

## 2016-08-21 DIAGNOSIS — J9600 Acute respiratory failure, unspecified whether with hypoxia or hypercapnia: Secondary | ICD-10-CM | POA: Diagnosis not present

## 2016-08-21 DIAGNOSIS — J189 Pneumonia, unspecified organism: Secondary | ICD-10-CM | POA: Diagnosis not present

## 2016-08-21 DIAGNOSIS — G8929 Other chronic pain: Secondary | ICD-10-CM | POA: Diagnosis not present

## 2016-08-22 DIAGNOSIS — J9600 Acute respiratory failure, unspecified whether with hypoxia or hypercapnia: Secondary | ICD-10-CM | POA: Diagnosis not present

## 2016-08-22 DIAGNOSIS — M6281 Muscle weakness (generalized): Secondary | ICD-10-CM | POA: Diagnosis not present

## 2016-08-22 DIAGNOSIS — G8929 Other chronic pain: Secondary | ICD-10-CM | POA: Diagnosis not present

## 2016-08-22 DIAGNOSIS — J189 Pneumonia, unspecified organism: Secondary | ICD-10-CM | POA: Diagnosis not present

## 2016-08-23 DIAGNOSIS — J189 Pneumonia, unspecified organism: Secondary | ICD-10-CM | POA: Diagnosis not present

## 2016-08-23 DIAGNOSIS — G8929 Other chronic pain: Secondary | ICD-10-CM | POA: Diagnosis not present

## 2016-08-23 DIAGNOSIS — J9600 Acute respiratory failure, unspecified whether with hypoxia or hypercapnia: Secondary | ICD-10-CM | POA: Diagnosis not present

## 2016-08-23 DIAGNOSIS — M6281 Muscle weakness (generalized): Secondary | ICD-10-CM | POA: Diagnosis not present

## 2016-08-24 DIAGNOSIS — J9600 Acute respiratory failure, unspecified whether with hypoxia or hypercapnia: Secondary | ICD-10-CM | POA: Diagnosis not present

## 2016-08-24 DIAGNOSIS — J189 Pneumonia, unspecified organism: Secondary | ICD-10-CM | POA: Diagnosis not present

## 2016-08-24 DIAGNOSIS — G8929 Other chronic pain: Secondary | ICD-10-CM | POA: Diagnosis not present

## 2016-08-24 DIAGNOSIS — M6281 Muscle weakness (generalized): Secondary | ICD-10-CM | POA: Diagnosis not present

## 2016-08-25 DIAGNOSIS — J189 Pneumonia, unspecified organism: Secondary | ICD-10-CM | POA: Diagnosis not present

## 2016-08-25 DIAGNOSIS — M6281 Muscle weakness (generalized): Secondary | ICD-10-CM | POA: Diagnosis not present

## 2016-08-25 DIAGNOSIS — G8929 Other chronic pain: Secondary | ICD-10-CM | POA: Diagnosis not present

## 2016-08-25 DIAGNOSIS — J9600 Acute respiratory failure, unspecified whether with hypoxia or hypercapnia: Secondary | ICD-10-CM | POA: Diagnosis not present

## 2016-08-27 DIAGNOSIS — J9600 Acute respiratory failure, unspecified whether with hypoxia or hypercapnia: Secondary | ICD-10-CM | POA: Diagnosis not present

## 2016-08-27 DIAGNOSIS — M6281 Muscle weakness (generalized): Secondary | ICD-10-CM | POA: Diagnosis not present

## 2016-08-27 DIAGNOSIS — G8929 Other chronic pain: Secondary | ICD-10-CM | POA: Diagnosis not present

## 2016-08-27 DIAGNOSIS — J189 Pneumonia, unspecified organism: Secondary | ICD-10-CM | POA: Diagnosis not present

## 2016-08-28 DIAGNOSIS — M6281 Muscle weakness (generalized): Secondary | ICD-10-CM | POA: Diagnosis not present

## 2016-08-28 DIAGNOSIS — J189 Pneumonia, unspecified organism: Secondary | ICD-10-CM | POA: Diagnosis not present

## 2016-08-28 DIAGNOSIS — G8929 Other chronic pain: Secondary | ICD-10-CM | POA: Diagnosis not present

## 2016-08-28 DIAGNOSIS — J9600 Acute respiratory failure, unspecified whether with hypoxia or hypercapnia: Secondary | ICD-10-CM | POA: Diagnosis not present

## 2016-08-29 DIAGNOSIS — J189 Pneumonia, unspecified organism: Secondary | ICD-10-CM | POA: Diagnosis not present

## 2016-08-29 DIAGNOSIS — M6281 Muscle weakness (generalized): Secondary | ICD-10-CM | POA: Diagnosis not present

## 2016-08-29 DIAGNOSIS — G8929 Other chronic pain: Secondary | ICD-10-CM | POA: Diagnosis not present

## 2016-08-29 DIAGNOSIS — J9600 Acute respiratory failure, unspecified whether with hypoxia or hypercapnia: Secondary | ICD-10-CM | POA: Diagnosis not present

## 2016-08-30 DIAGNOSIS — G8929 Other chronic pain: Secondary | ICD-10-CM | POA: Diagnosis not present

## 2016-08-30 DIAGNOSIS — Z89611 Acquired absence of right leg above knee: Secondary | ICD-10-CM | POA: Diagnosis not present

## 2016-08-30 DIAGNOSIS — J189 Pneumonia, unspecified organism: Secondary | ICD-10-CM | POA: Diagnosis not present

## 2016-08-30 DIAGNOSIS — I4891 Unspecified atrial fibrillation: Secondary | ICD-10-CM | POA: Diagnosis not present

## 2016-08-30 DIAGNOSIS — M545 Low back pain: Secondary | ICD-10-CM | POA: Diagnosis not present

## 2016-08-30 DIAGNOSIS — B1920 Unspecified viral hepatitis C without hepatic coma: Secondary | ICD-10-CM | POA: Diagnosis not present

## 2016-08-30 DIAGNOSIS — Z89612 Acquired absence of left leg above knee: Secondary | ICD-10-CM | POA: Diagnosis not present

## 2016-08-30 DIAGNOSIS — F209 Schizophrenia, unspecified: Secondary | ICD-10-CM | POA: Diagnosis not present

## 2016-08-30 DIAGNOSIS — M549 Dorsalgia, unspecified: Secondary | ICD-10-CM | POA: Diagnosis not present

## 2016-08-30 DIAGNOSIS — I739 Peripheral vascular disease, unspecified: Secondary | ICD-10-CM | POA: Diagnosis not present

## 2016-08-30 DIAGNOSIS — J9600 Acute respiratory failure, unspecified whether with hypoxia or hypercapnia: Secondary | ICD-10-CM | POA: Diagnosis not present

## 2016-08-30 DIAGNOSIS — M6281 Muscle weakness (generalized): Secondary | ICD-10-CM | POA: Diagnosis not present

## 2016-09-01 DIAGNOSIS — J9600 Acute respiratory failure, unspecified whether with hypoxia or hypercapnia: Secondary | ICD-10-CM | POA: Diagnosis not present

## 2016-09-01 DIAGNOSIS — J189 Pneumonia, unspecified organism: Secondary | ICD-10-CM | POA: Diagnosis not present

## 2016-09-01 DIAGNOSIS — G8929 Other chronic pain: Secondary | ICD-10-CM | POA: Diagnosis not present

## 2016-09-01 DIAGNOSIS — M6281 Muscle weakness (generalized): Secondary | ICD-10-CM | POA: Diagnosis not present

## 2016-09-03 DIAGNOSIS — J189 Pneumonia, unspecified organism: Secondary | ICD-10-CM | POA: Diagnosis not present

## 2016-09-03 DIAGNOSIS — M6281 Muscle weakness (generalized): Secondary | ICD-10-CM | POA: Diagnosis not present

## 2016-09-03 DIAGNOSIS — G8929 Other chronic pain: Secondary | ICD-10-CM | POA: Diagnosis not present

## 2016-09-03 DIAGNOSIS — J9600 Acute respiratory failure, unspecified whether with hypoxia or hypercapnia: Secondary | ICD-10-CM | POA: Diagnosis not present

## 2016-09-04 DIAGNOSIS — G8929 Other chronic pain: Secondary | ICD-10-CM | POA: Diagnosis not present

## 2016-09-04 DIAGNOSIS — J9600 Acute respiratory failure, unspecified whether with hypoxia or hypercapnia: Secondary | ICD-10-CM | POA: Diagnosis not present

## 2016-09-04 DIAGNOSIS — J189 Pneumonia, unspecified organism: Secondary | ICD-10-CM | POA: Diagnosis not present

## 2016-09-04 DIAGNOSIS — M6281 Muscle weakness (generalized): Secondary | ICD-10-CM | POA: Diagnosis not present

## 2016-09-05 ENCOUNTER — Encounter (HOSPITAL_COMMUNITY): Payer: Self-pay | Admitting: *Deleted

## 2016-09-05 DIAGNOSIS — M6281 Muscle weakness (generalized): Secondary | ICD-10-CM | POA: Diagnosis not present

## 2016-09-05 DIAGNOSIS — J9600 Acute respiratory failure, unspecified whether with hypoxia or hypercapnia: Secondary | ICD-10-CM | POA: Diagnosis not present

## 2016-09-05 DIAGNOSIS — J189 Pneumonia, unspecified organism: Secondary | ICD-10-CM | POA: Diagnosis not present

## 2016-09-05 DIAGNOSIS — G8929 Other chronic pain: Secondary | ICD-10-CM | POA: Diagnosis not present
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
## 2016-09-05 NOTE — Progress Notes (Signed)
Pt completed SDW-pre-op call assessment while at Dr. Randa Evens Office. Pt denies SOB, chest pain, and being under the care of a cardiologist. Pt stated that if any cardiac studies were done, " they were done at [facility] when I was there for 6 months. " Pt pre-op instructions were faxed and reviewed with pt Nurse, Scarlette Calico, LPN. Nurse verbalized understanding of all  pre-op instructions.

## 2016-09-05 NOTE — Pre-Procedure Instructions (Addendum)
    [name] [name]  09/05/2016     No Pharmacies Listed   Your procedure is scheduled on Friday, Sep 07, 2016  Report to [facility] Admitting at 6:15 A.M.  Call this number if you have problems the morning of surgery:  [phone]   Remember:  Do not eat food or drink liquids after midnight Thursday, Sep 06, 2016  Take these medicines the morning of surgery with A SIP OF WATER : Lopressor, Valium,  MS Contin  Stop taking Aspirin, vitamins, fish oil, and herbal medications. Do not take any NSAIDs ie: Ibuprofen, Advil, Naproxen, BC and Goody Powder or any medication containing Aspirin; stop now.  Do not wear jewelry, make-up or nail polish.  Do not wear lotions, powders, or perfumes, or deoderant.  Do not shave 48 hours prior to surgery.  Men may shave face and neck.  Do not bring valuables to the hospital.  [facility] is not responsible for any belongings or valuables.  Contacts, dentures or bridgework may not be worn into surgery.  Leave your suitcase in the car.  After surgery it may be brought to your room.  For patients admitted to the hospital, discharge time will be determined by your treatment team.  Patients discharged the day of surgery will not be allowed to drive home.   Please read over the following fact sheets that you were given.

## 2016-09-06 DIAGNOSIS — J9600 Acute respiratory failure, unspecified whether with hypoxia or hypercapnia: Secondary | ICD-10-CM | POA: Diagnosis not present

## 2016-09-06 DIAGNOSIS — G8929 Other chronic pain: Secondary | ICD-10-CM | POA: Diagnosis not present

## 2016-09-06 DIAGNOSIS — M6281 Muscle weakness (generalized): Secondary | ICD-10-CM | POA: Diagnosis not present

## 2016-09-06 DIAGNOSIS — J189 Pneumonia, unspecified organism: Secondary | ICD-10-CM | POA: Diagnosis not present

## 2016-09-06 NOTE — Progress Notes (Signed)
Requested that Amedeo Gory), Surgical Coordinator, ask MD to clarify pre-op Aspirin instructions. According to Sam, MD stated " he is aware of the Afib and it is okay to hold the Aspirin."

## 2016-09-07 ENCOUNTER — Ambulatory Visit (HOSPITAL_COMMUNITY)
Admission: RE | Admit: 2016-09-07 | Discharge: 2016-09-07 | Disposition: A | Discharge status: Home / Self Care | Payer: Medicare Other | Source: Ambulatory Visit | Attending: Oral Surgery | Admitting: Oral Surgery

## 2016-09-07 ENCOUNTER — Ambulatory Visit (HOSPITAL_COMMUNITY): Payer: Medicare Other | Admitting: Vascular Surgery

## 2016-09-07 ENCOUNTER — Encounter (HOSPITAL_COMMUNITY)
Admission: RE | Disposition: A | Discharge status: Home / Self Care | Payer: Self-pay | Source: Ambulatory Visit | Attending: Oral Surgery

## 2016-09-07 ENCOUNTER — Encounter (HOSPITAL_COMMUNITY): Payer: Self-pay | Admitting: *Deleted

## 2016-09-07 DIAGNOSIS — J189 Pneumonia, unspecified organism: Secondary | ICD-10-CM | POA: Diagnosis not present

## 2016-09-07 DIAGNOSIS — M1990 Unspecified osteoarthritis, unspecified site: Secondary | ICD-10-CM | POA: Diagnosis not present

## 2016-09-07 DIAGNOSIS — I4891 Unspecified atrial fibrillation: Secondary | ICD-10-CM | POA: Insufficient documentation

## 2016-09-07 DIAGNOSIS — F319 Bipolar disorder, unspecified: Secondary | ICD-10-CM | POA: Diagnosis not present

## 2016-09-07 DIAGNOSIS — Z87442 Personal history of urinary calculi: Secondary | ICD-10-CM | POA: Insufficient documentation

## 2016-09-07 DIAGNOSIS — Z880 Allergy status to penicillin: Secondary | ICD-10-CM | POA: Insufficient documentation

## 2016-09-07 DIAGNOSIS — G4700 Insomnia, unspecified: Secondary | ICD-10-CM | POA: Insufficient documentation

## 2016-09-07 DIAGNOSIS — K0889 Other specified disorders of teeth and supporting structures: Secondary | ICD-10-CM | POA: Diagnosis not present

## 2016-09-07 DIAGNOSIS — F209 Schizophrenia, unspecified: Secondary | ICD-10-CM | POA: Diagnosis not present

## 2016-09-07 DIAGNOSIS — K029 Dental caries, unspecified: Secondary | ICD-10-CM | POA: Insufficient documentation

## 2016-09-07 DIAGNOSIS — M6281 Muscle weakness (generalized): Secondary | ICD-10-CM | POA: Diagnosis not present

## 2016-09-07 DIAGNOSIS — B1920 Unspecified viral hepatitis C without hepatic coma: Secondary | ICD-10-CM | POA: Insufficient documentation

## 2016-09-07 DIAGNOSIS — I480 Paroxysmal atrial fibrillation: Secondary | ICD-10-CM | POA: Diagnosis not present

## 2016-09-07 DIAGNOSIS — G8929 Other chronic pain: Secondary | ICD-10-CM | POA: Diagnosis not present

## 2016-09-07 DIAGNOSIS — J9600 Acute respiratory failure, unspecified whether with hypoxia or hypercapnia: Secondary | ICD-10-CM | POA: Diagnosis not present

## 2016-09-07 DIAGNOSIS — F4310 Post-traumatic stress disorder, unspecified: Secondary | ICD-10-CM | POA: Diagnosis not present

## 2016-09-07 HISTORY — DX: Bipolar disorder, unspecified: F31.9

## 2016-09-07 HISTORY — DX: Unspecified osteoarthritis, unspecified site: M19.90

## 2016-09-07 HISTORY — DX: Headache: R51

## 2016-09-07 HISTORY — DX: Concussion with loss of consciousness status unknown, initial encounter: S06.0XAA

## 2016-09-07 HISTORY — DX: Insomnia, unspecified: G47.00

## 2016-09-07 HISTORY — DX: Dorsalgia, unspecified: M54.9

## 2016-09-07 HISTORY — DX: Unspecified atrial fibrillation: I48.91

## 2016-09-07 HISTORY — DX: Other chronic pain: G89.29

## 2016-09-07 HISTORY — DX: Inflammatory liver disease, unspecified: K75.9

## 2016-09-07 HISTORY — DX: Concussion with loss of consciousness of unspecified duration, initial encounter: S06.0X9A

## 2016-09-07 HISTORY — DX: Personal history of urinary calculi: Z87.442

## 2016-09-07 HISTORY — DX: Accidental discharge from unspecified firearms or gun, initial encounter: W34.00XA

## 2016-09-07 HISTORY — DX: Panic disorder (episodic paroxysmal anxiety): F41.0

## 2016-09-07 HISTORY — DX: Depression, unspecified: F32.A

## 2016-09-07 HISTORY — DX: Dental caries, unspecified: K02.9

## 2016-09-07 HISTORY — DX: Major depressive disorder, single episode, unspecified: F32.9

## 2016-09-07 HISTORY — DX: Post-traumatic stress disorder, unspecified: F43.10

## 2016-09-07 HISTORY — PX: MULTIPLE EXTRACTIONS WITH ALVEOLOPLASTY: SHX5342

## 2016-09-07 LAB — COMPREHENSIVE METABOLIC PANEL
ALT: 21 U/L (ref 17–63)
AST: 25 U/L (ref 15–41)
Albumin: 3.6 g/dL (ref 3.5–5.0)
Alkaline Phosphatase: 96 U/L (ref 38–126)
Anion gap: 9 (ref 5–15)
BUN: 12 mg/dL (ref 6–20)
CALCIUM: 8.9 mg/dL (ref 8.9–10.3)
CO2: 19 mmol/L — AB (ref 22–32)
Chloride: 109 mmol/L (ref 101–111)
Creatinine, Ser: 0.65 mg/dL (ref 0.61–1.24)
GFR calc non Af Amer: >60 mL/min (ref >60)
GLUCOSE: 109 mg/dL — AB (ref 65–99)
Potassium: 3.5 mmol/L (ref 3.5–5.1)
SODIUM: 137 mmol/L (ref 135–145)
Total Bilirubin: 0.7 mg/dL (ref 0.3–1.2)
Total Protein: 6.6 g/dL (ref 6.5–8.1)

## 2016-09-07 LAB — CBC
HCT: 43.8 % (ref 39.0–52.0)
Hemoglobin: 14.5 g/dL (ref 13.0–17.0)
MCH: 30.7 pg (ref 26.0–34.0)
MCHC: 33.1 g/dL (ref 30.0–36.0)
MCV: 92.8 fL (ref 78.0–100.0)
Platelets: 213 10*3/uL (ref 150–400)
RBC: 4.72 MIL/uL (ref 4.22–5.81)
RDW: 14 % (ref 11.5–15.5)
WBC: 10.2 10*3/uL (ref 4.0–10.5)

## 2016-09-07 SURGERY — MULTIPLE EXTRACTION WITH ALVEOLOPLASTY
Anesthesia: General | Site: Mouth

## 2016-09-07 MED ORDER — MIDAZOLAM HCL 5 MG/5ML IJ SOLN
INTRAMUSCULAR | Status: DC | PRN
  Administered 2016-09-07: 2 mg via INTRAVENOUS

## 2016-09-07 MED ORDER — FENTANYL CITRATE (PF) 250 MCG/5ML IJ SOLN
INTRAMUSCULAR | Status: AC
  Filled 2016-09-07: qty 5

## 2016-09-07 MED ORDER — SUCCINYLCHOLINE CHLORIDE 20 MG/ML IJ SOLN
INTRAMUSCULAR | Status: DC | PRN
  Administered 2016-09-07: 140 mg via INTRAVENOUS

## 2016-09-07 MED ORDER — PROPOFOL 10 MG/ML IV BOLUS
INTRAVENOUS | Status: DC | PRN
  Administered 2016-09-07: 30 mg via INTRAVENOUS
  Administered 2016-09-07: 120 mg via INTRAVENOUS

## 2016-09-07 MED ORDER — ONDANSETRON HCL 4 MG/2ML IJ SOLN
INTRAMUSCULAR | Status: AC
  Filled 2016-09-07: qty 2

## 2016-09-07 MED ORDER — OXYMETAZOLINE HCL 0.05 % NA SOLN
NASAL | Status: DC | PRN
  Administered 2016-09-07 (×2): 1 via NASAL

## 2016-09-07 MED ORDER — MIDAZOLAM HCL 2 MG/2ML IJ SOLN
INTRAMUSCULAR | Status: AC
  Filled 2016-09-07: qty 2

## 2016-09-07 MED ORDER — SUCCINYLCHOLINE CHLORIDE 200 MG/10ML IV SOSY
PREFILLED_SYRINGE | INTRAVENOUS | Status: AC
  Filled 2016-09-07: qty 10

## 2016-09-07 MED ORDER — LIDOCAINE 2% (20 MG/ML) 5 ML SYRINGE
INTRAMUSCULAR | Status: AC
  Filled 2016-09-07: qty 5

## 2016-09-07 MED ORDER — LACTATED RINGERS IV SOLN
INTRAVENOUS | Status: DC | PRN
  Administered 2016-09-07 (×2): via INTRAVENOUS

## 2016-09-07 MED ORDER — FENTANYL CITRATE (PF) 100 MCG/2ML IJ SOLN: 25.0000 ug | INTRAMUSCULAR | Status: DC | PRN

## 2016-09-07 MED ORDER — CLINDAMYCIN PHOSPHATE 600 MG/50ML IV SOLN
600.0000 mg | Freq: Once | INTRAVENOUS | Status: AC
  Administered 2016-09-07: 600 mg via INTRAVENOUS

## 2016-09-07 MED ORDER — SODIUM CHLORIDE 0.9 % IR SOLN
Status: DC | PRN
  Administered 2016-09-07: 1000 mL

## 2016-09-07 MED ORDER — LIDOCAINE-EPINEPHRINE 2 %-1:100000 IJ SOLN
INTRAMUSCULAR | Status: DC | PRN
  Administered 2016-09-07: 15 mL via INTRADERMAL

## 2016-09-07 MED ORDER — HYDROMORPHONE HCL 4 MG PO TABS: 4.0000 mg | ORAL_TABLET | ORAL | 0 refills | Status: DC | PRN

## 2016-09-07 MED ORDER — 0.9 % SODIUM CHLORIDE (POUR BTL) OPTIME
TOPICAL | Status: DC | PRN
  Administered 2016-09-07: 1000 mL

## 2016-09-07 MED ORDER — CLINDAMYCIN PHOSPHATE 600 MG/50ML IV SOLN
INTRAVENOUS | Status: AC
  Filled 2016-09-07: qty 50

## 2016-09-07 MED ORDER — CEFAZOLIN SODIUM-DEXTROSE 2-4 GM/100ML-% IV SOLN
INTRAVENOUS | Status: AC
  Filled 2016-09-07: qty 100

## 2016-09-07 MED ORDER — ONDANSETRON HCL 4 MG/2ML IJ SOLN
INTRAMUSCULAR | Status: DC | PRN
  Administered 2016-09-07: 4 mg via INTRAVENOUS

## 2016-09-07 MED ORDER — EPHEDRINE SULFATE 50 MG/ML IJ SOLN
INTRAMUSCULAR | Status: DC | PRN
  Administered 2016-09-07: 10 mg via INTRAVENOUS

## 2016-09-07 MED ORDER — OXYMETAZOLINE HCL 0.05 % NA SOLN
NASAL | Status: AC
  Filled 2016-09-07: qty 15

## 2016-09-07 MED ORDER — LIDOCAINE HCL (CARDIAC) 20 MG/ML IV SOLN
INTRAVENOUS | Status: DC | PRN
  Administered 2016-09-07: 100 mg via INTRAVENOUS

## 2016-09-07 MED ORDER — FENTANYL CITRATE (PF) 100 MCG/2ML IJ SOLN
INTRAMUSCULAR | Status: DC | PRN
  Administered 2016-09-07: 50 ug via INTRAVENOUS
  Administered 2016-09-07: 100 ug via INTRAVENOUS
  Administered 2016-09-07 (×3): 50 ug via INTRAVENOUS

## 2016-09-07 MED ORDER — OXYMETAZOLINE HCL 0.05 % NA SOLN
NASAL | Status: DC | PRN
  Administered 2016-09-07: 1

## 2016-09-07 MED ORDER — PROPOFOL 10 MG/ML IV BOLUS
INTRAVENOUS | Status: AC
  Filled 2016-09-07: qty 20

## 2016-09-07 SURGICAL SUPPLY — 32 items
BUR CROSS CUT FISSURE 1.6 (BURR) ×2 IMPLANT
BUR CROSS CUT FISSURE 1.6MM (BURR) ×1
BUR EGG ELITE 4.0 (BURR) ×2 IMPLANT
BUR EGG ELITE 4.0MM (BURR) ×1
CANISTER SUCT 3000ML PPV (MISCELLANEOUS) ×3 IMPLANT
COVER SURGICAL LIGHT HANDLE (MISCELLANEOUS) ×3 IMPLANT
CRADLE DONUT ADULT HEAD (MISCELLANEOUS) ×3 IMPLANT
DECANTER SPIKE VIAL GLASS SM (MISCELLANEOUS) IMPLANT
DRAPE U-SHAPE 76X120 STRL (DRAPES) ×3 IMPLANT
FLUID NSS /IRRIG 1000 ML XXX (MISCELLANEOUS) ×3 IMPLANT
GAUZE PACKING FOLDED 2  STR (GAUZE/BANDAGES/DRESSINGS) ×2
GAUZE PACKING FOLDED 2 STR (GAUZE/BANDAGES/DRESSINGS) ×1 IMPLANT
GLOVE BIO SURGEON STRL SZ 6.5 (GLOVE) ×2 IMPLANT
GLOVE BIO SURGEON STRL SZ7.5 (GLOVE) ×3 IMPLANT
GLOVE BIO SURGEONS STRL SZ 6.5 (GLOVE) ×1
GLOVE BIOGEL PI IND STRL 7.0 (GLOVE) ×1 IMPLANT
GLOVE BIOGEL PI INDICATOR 7.0 (GLOVE) ×2
GOWN STRL REUS W/ TWL LRG LVL3 (GOWN DISPOSABLE) ×2 IMPLANT
GOWN STRL REUS W/ TWL XL LVL3 (GOWN DISPOSABLE) ×1 IMPLANT
GOWN STRL REUS W/TWL LRG LVL3 (GOWN DISPOSABLE) ×4
GOWN STRL REUS W/TWL XL LVL3 (GOWN DISPOSABLE) ×2
KIT BASIN OR (CUSTOM PROCEDURE TRAY) ×3 IMPLANT
KIT ROOM TURNOVER OR (KITS) ×3 IMPLANT
NEEDLE 22X1 1/2 (OR ONLY) (NEEDLE) ×6 IMPLANT
NS IRRIG 1000ML POUR BTL (IV SOLUTION) ×3 IMPLANT
PAD ARMBOARD 7.5X6 YLW CONV (MISCELLANEOUS) ×3 IMPLANT
SUT CHROMIC 3 0 PS 2 (SUTURE) ×3 IMPLANT
SYR CONTROL 10ML LL (SYRINGE) ×3 IMPLANT
TOWEL GREEN STERILE FF (TOWEL DISPOSABLE) ×3 IMPLANT
TRAY ENT MC OR (CUSTOM PROCEDURE TRAY) ×3 IMPLANT
TUBING IRRIGATION (MISCELLANEOUS) ×3 IMPLANT
YANKAUER SUCT BULB TIP NO VENT (SUCTIONS) ×3 IMPLANT

## 2016-09-07 NOTE — Anesthesia Postprocedure Evaluation (Signed)
Anesthesia Post Note  Patient: [name] [name]  Procedure(s) Performed: Procedure(s) (LRB): MULTIPLE EXTRACTION WITH ALVEOLOPLASTY.  EXTRACTION TEETH NUMBER THIRTEEN, FIFTEEN, TWENTY-ONE, TWENTY-TWO, TWENTY-THREE, TWENTY-FOUR, TWENTY-FIVE, TWENTY-SIX, TWENTY-SEVEN AND THIRTY-TWO (N/A)  Patient location during evaluation: PACU Anesthesia Type: General Level of consciousness: awake Pain management: pain level controlled Vital Signs Assessment: post-procedure vital signs reviewed and stable Respiratory status: spontaneous breathing Cardiovascular status: stable Anesthetic complications: no       Last Vitals:  Vitals:   09/07/16 0940 09/07/16 0955  BP:  124/77  Pulse:  (!) 51  Resp:  16  Temp: 36.6 C     Last Pain:  Vitals:   09/07/16 0955  TempSrc:   PainSc: 6                  Amal Saiki

## 2016-09-07 NOTE — H&P (Signed)
H&P documentation  -History and Physical Reviewed  -Patient has been re-examined  -No change in the plan of care  [name] [name]  

## 2016-09-07 NOTE — Op Note (Signed)
[name] [name], [name] [name]             ACCOUNT NO.:  [id number]  MEDICAL RECORD NO.:  [id number]  LOCATION:                                 FACILITY:  PHYSICIAN:  Georgia Lopes, M.D.  DATE OF BIRTH:  [birth year]  DATE OF PROCEDURE:  09/07/2016 DATE OF DISCHARGE:                              OPERATIVE REPORT   PREOPERATIVE DIAGNOSIS:  Nonrestorable teeth numbers 13, 15, 21, 22, 23, 24, 25, 26, 27, 32.  PROCEDURE:  Extraction teeth numbers 13, 15, 21, 22, 23, 24, 25, 26, 27, 32 and alveoplasty, right and left mandible.  SURGEON:  Georgia Lopes, M.D.  ANESTHESIA:  General, nasal intubation.  Dr. Randa Evens attending.  DESCRIPTION OF PROCEDURE:  The patient was taken to the operating room, placed on the table in supine position.  General anesthesia was administered intravenously and a nasal endotracheal tube was placed and secured.  The eyes were protected and the patient was draped for surgery.  Time-out was performed.  The posterior pharynx was suctioned and a throat pack was placed.  A 2% lidocaine with 1:100,000 epinephrine was infiltrated in an inferior alveolar block on the right and left sides and buccal and palatal infiltration in the left posterior maxilla. Total of 15 mL was utilized.  A #15 blade was used to make an incision approximately 1 cm distal to tooth #21 and carried forward both buccally and lingually in the gingival sulcus until a point approximately 1 cm distal to tooth #28.  The periosteum was reflected from around these teeth.  The gingiva was trimmed for closure and then the teeth were elevated with a 301 elevator and removed from the mouth with the Asch forceps.  The periosteum was further reflected to expose the alveolar crest and then the Stryker handpiece and a bone file were used to perform an alveoplasty on the right and left sides.  Then, this area was irrigated and closed with 3-0 chromic.  Then, a 15 blade was used to make an incision around teeth  numbers 13 and 15.  The teeth were removed using a periosteal elevator and 301 elevator and rongeur.  Then, the sockets were curetted and irrigated and closed with 3-0 chromic.  The oral cavity was then irrigated and suctioned.  A throat pack was removed.  The patient was left in care of anesthesia for awakening and transportation to recovery room.  ESTIMATED BLOOD LOSS:  Minimal.  COMPLICATIONS:  None.  SPECIMENS:  None.     Georgia Lopes, M.D.     SMJ/MEDQ  D:  09/07/2016  T:  09/07/2016  Job:  [id number]

## 2016-09-07 NOTE — Anesthesia Preprocedure Evaluation (Addendum)
Anesthesia Evaluation  Patient identified by MRN, date of birth, ID band Patient awake    Reviewed: Allergy & Precautions, NPO status , Patient's Chart, lab work & pertinent test results  History of Anesthesia Complications (+) POST - OP SPINAL HEADACHENegative for: history of anesthetic complications  Airway Mallampati: II  TM Distance: >3 FB Neck ROM: Full    Dental  (+) Poor Dentition, Dental Advisory Given   Pulmonary Current Smoker,    breath sounds clear to auscultation       Cardiovascular negative cardio ROS   Rhythm:Regular Rate:Normal     Neuro/Psych  Headaches, PSYCHIATRIC DISORDERS Anxiety Depression Bipolar Disorder Schizophrenia    GI/Hepatic negative GI ROS, (+) Hepatitis -, C  Endo/Other  negative endocrine ROS  Renal/GU negative Renal ROS     Musculoskeletal   Abdominal   Peds  Hematology   Anesthesia Other Findings   Reproductive/Obstetrics negative OB ROS                           Anesthesia Physical Anesthesia Plan  ASA: III  Anesthesia Plan: General   Post-op Pain Management:    Induction: Intravenous  Airway Management Planned: Nasal ETT  Additional Equipment:   Intra-op Plan:   Post-operative Plan: Extubation in OR  Informed Consent: I have reviewed the patients History and Physical, chart, labs and discussed the procedure including the risks, benefits and alternatives for the proposed anesthesia with the patient or authorized representative who has indicated his/her understanding and acceptance.   Dental advisory given  Plan Discussed with: Anesthesiologist, CRNA and Surgeon  Anesthesia Plan Comments:         Anesthesia Quick Evaluation

## 2016-09-07 NOTE — Op Note (Signed)
09/07/2016  8:52 AM  PATIENT:  [name] [name]  61 y.o. male  PRE-OPERATIVE DIAGNOSIS:  NON RESTORABLE TEETH NUMBER THIRTEEN, FIFTEEN, TWENTY-ONE, TWENTY-TWO, TWENTY-THREE, TWENTY-FOUR, TWENTY-FIVE, TWENTY-SIX, TWENTY-SEVEN AND THIRTY-TWO  POST-OPERATIVE DIAGNOSIS:  SAME  PROCEDURE:  Procedure(s):  EXTRACTION TEETH NUMBER THIRTEEN, FIFTEEN, TWENTY-ONE, TWENTY-TWO, TWENTY-THREE, TWENTY-FOUR, TWENTY-FIVE, TWENTY-SIX, TWENTY-SEVEN AND THIRTY-TWO; ALVEOLOPLASTY   SURGEON:  Surgeon(s): Ocie Doyne, DDS  ANESTHESIA:   local and general  EBL:  minimal  DRAINS: none   SPECIMEN:  No Specimen  COUNTS:  YES  PLAN OF CARE: Discharge to home after PACU  PATIENT DISPOSITION:  PACU - hemodynamically stable.   PROCEDURE DETAILS: Dictation # [id number]  Georgia Lopes, DMD 09/07/2016 8:52 AM

## 2016-09-07 NOTE — Transfer of Care (Signed)
Immediate Anesthesia Transfer of Care Note  Patient: [name] [name]  Procedure(s) Performed: Procedure(s): MULTIPLE EXTRACTION WITH ALVEOLOPLASTY.  EXTRACTION TEETH NUMBER THIRTEEN, FIFTEEN, TWENTY-ONE, TWENTY-TWO, TWENTY-THREE, TWENTY-FOUR, TWENTY-FIVE, TWENTY-SIX, TWENTY-SEVEN AND THIRTY-TWO (N/A)  Patient Location: PACU  Anesthesia Type:General  Level of Consciousness: awake, oriented and patient cooperative  Airway & Oxygen Therapy: Patient Spontanous Breathing and Patient connected to nasal cannula oxygen  Post-op Assessment: Report given to RN and Post -op Vital signs reviewed and stable  Post vital signs: Reviewed  Last Vitals:  Vitals:   09/07/16 0631  BP: 120/72  Pulse: (!) 48  Resp: 20  Temp: 36.7 C    Last Pain:  Vitals:   09/07/16 0653  TempSrc:   PainSc: 7       Patients Stated Pain Goal: 3 (09/07/16 4315)  Complications: No apparent anesthesia complications

## 2016-09-07 NOTE — Anesthesia Procedure Notes (Signed)
Procedure Name: Intubation Date/Time: 09/07/2016 8:15 AM Performed by: Luciana Axe K Pre-anesthesia Checklist: Patient identified, Emergency Drugs available, Suction available, Patient being monitored and Timeout performed Patient Re-evaluated:Patient Re-evaluated prior to inductionOxygen Delivery Method: Circle system utilized Preoxygenation: Pre-oxygenation with 100% oxygen Intubation Type: IV induction Ventilation: Mask ventilation without difficulty and Oral airway inserted - appropriate to patient size Laryngoscope Size: Mac and 3 Grade View: Grade I Nasal Tubes: Left, Nasal prep performed, Nasal Rae and Magill forceps- large, utilized Tube size: 7.0 mm Number of attempts: 1 Airway Equipment and Method: Oral airway Placement Confirmation: ETT inserted through vocal cords under direct vision,  CO2 detector and breath sounds checked- equal and bilateral Secured at: 28 cm Tube secured with: Tape Dental Injury: Teeth and Oropharynx as per pre-operative assessment

## 2016-09-07 NOTE — Progress Notes (Signed)
Paged pharmacy for med rec to be completed

## 2016-09-08 ENCOUNTER — Encounter (HOSPITAL_COMMUNITY): Payer: Self-pay | Admitting: Oral Surgery

## 2016-09-08 DIAGNOSIS — M6281 Muscle weakness (generalized): Secondary | ICD-10-CM | POA: Diagnosis not present

## 2016-09-08 DIAGNOSIS — J9600 Acute respiratory failure, unspecified whether with hypoxia or hypercapnia: Secondary | ICD-10-CM | POA: Diagnosis not present

## 2016-09-08 DIAGNOSIS — G8929 Other chronic pain: Secondary | ICD-10-CM | POA: Diagnosis not present

## 2016-09-08 DIAGNOSIS — J189 Pneumonia, unspecified organism: Secondary | ICD-10-CM | POA: Diagnosis not present

## 2016-09-11 ENCOUNTER — Encounter (HOSPITAL_COMMUNITY): Payer: Self-pay | Admitting: General Practice

## 2016-09-12 DIAGNOSIS — J189 Pneumonia, unspecified organism: Secondary | ICD-10-CM | POA: Diagnosis not present

## 2016-09-12 DIAGNOSIS — J9600 Acute respiratory failure, unspecified whether with hypoxia or hypercapnia: Secondary | ICD-10-CM | POA: Diagnosis not present

## 2016-09-12 DIAGNOSIS — G8929 Other chronic pain: Secondary | ICD-10-CM | POA: Diagnosis not present

## 2016-09-12 DIAGNOSIS — M6281 Muscle weakness (generalized): Secondary | ICD-10-CM | POA: Diagnosis not present

## 2016-09-13 DIAGNOSIS — M6281 Muscle weakness (generalized): Secondary | ICD-10-CM | POA: Diagnosis not present

## 2016-09-13 DIAGNOSIS — J189 Pneumonia, unspecified organism: Secondary | ICD-10-CM | POA: Diagnosis not present

## 2016-09-13 DIAGNOSIS — J9600 Acute respiratory failure, unspecified whether with hypoxia or hypercapnia: Secondary | ICD-10-CM | POA: Diagnosis not present

## 2016-09-13 DIAGNOSIS — G8929 Other chronic pain: Secondary | ICD-10-CM | POA: Diagnosis not present

## 2016-09-14 DIAGNOSIS — M6281 Muscle weakness (generalized): Secondary | ICD-10-CM | POA: Diagnosis not present

## 2016-09-14 DIAGNOSIS — G8929 Other chronic pain: Secondary | ICD-10-CM | POA: Diagnosis not present

## 2016-09-14 DIAGNOSIS — J189 Pneumonia, unspecified organism: Secondary | ICD-10-CM | POA: Diagnosis not present

## 2016-09-14 DIAGNOSIS — J9600 Acute respiratory failure, unspecified whether with hypoxia or hypercapnia: Secondary | ICD-10-CM | POA: Diagnosis not present

## 2016-09-15 DIAGNOSIS — J189 Pneumonia, unspecified organism: Secondary | ICD-10-CM | POA: Diagnosis not present

## 2016-09-15 DIAGNOSIS — M6281 Muscle weakness (generalized): Secondary | ICD-10-CM | POA: Diagnosis not present

## 2016-09-15 DIAGNOSIS — J9600 Acute respiratory failure, unspecified whether with hypoxia or hypercapnia: Secondary | ICD-10-CM | POA: Diagnosis not present

## 2016-09-15 DIAGNOSIS — G8929 Other chronic pain: Secondary | ICD-10-CM | POA: Diagnosis not present

## 2016-09-17 DIAGNOSIS — G8929 Other chronic pain: Secondary | ICD-10-CM | POA: Diagnosis not present

## 2016-09-17 DIAGNOSIS — M6281 Muscle weakness (generalized): Secondary | ICD-10-CM | POA: Diagnosis not present

## 2016-09-17 DIAGNOSIS — J189 Pneumonia, unspecified organism: Secondary | ICD-10-CM | POA: Diagnosis not present

## 2016-09-17 DIAGNOSIS — J9600 Acute respiratory failure, unspecified whether with hypoxia or hypercapnia: Secondary | ICD-10-CM | POA: Diagnosis not present

## 2016-09-18 DIAGNOSIS — J189 Pneumonia, unspecified organism: Secondary | ICD-10-CM | POA: Diagnosis not present

## 2016-09-18 DIAGNOSIS — M6281 Muscle weakness (generalized): Secondary | ICD-10-CM | POA: Diagnosis not present

## 2016-09-18 DIAGNOSIS — J9600 Acute respiratory failure, unspecified whether with hypoxia or hypercapnia: Secondary | ICD-10-CM | POA: Diagnosis not present

## 2016-09-18 DIAGNOSIS — G8929 Other chronic pain: Secondary | ICD-10-CM | POA: Diagnosis not present

## 2016-09-19 DIAGNOSIS — J9600 Acute respiratory failure, unspecified whether with hypoxia or hypercapnia: Secondary | ICD-10-CM | POA: Diagnosis not present

## 2016-09-19 DIAGNOSIS — G8929 Other chronic pain: Secondary | ICD-10-CM | POA: Diagnosis not present

## 2016-09-19 DIAGNOSIS — J189 Pneumonia, unspecified organism: Secondary | ICD-10-CM | POA: Diagnosis not present

## 2016-09-19 DIAGNOSIS — M6281 Muscle weakness (generalized): Secondary | ICD-10-CM | POA: Diagnosis not present

## 2016-09-20 DIAGNOSIS — F250 Schizoaffective disorder, bipolar type: Secondary | ICD-10-CM | POA: Diagnosis not present

## 2016-09-20 DIAGNOSIS — G8929 Other chronic pain: Secondary | ICD-10-CM | POA: Diagnosis not present

## 2016-09-20 DIAGNOSIS — M6281 Muscle weakness (generalized): Secondary | ICD-10-CM | POA: Diagnosis not present

## 2016-09-20 DIAGNOSIS — F329 Major depressive disorder, single episode, unspecified: Secondary | ICD-10-CM | POA: Diagnosis not present

## 2016-09-20 DIAGNOSIS — J189 Pneumonia, unspecified organism: Secondary | ICD-10-CM | POA: Diagnosis not present

## 2016-09-20 DIAGNOSIS — F419 Anxiety disorder, unspecified: Secondary | ICD-10-CM | POA: Diagnosis not present

## 2016-09-20 DIAGNOSIS — G4700 Insomnia, unspecified: Secondary | ICD-10-CM | POA: Diagnosis not present

## 2016-09-20 DIAGNOSIS — J9600 Acute respiratory failure, unspecified whether with hypoxia or hypercapnia: Secondary | ICD-10-CM | POA: Diagnosis not present

## 2016-09-21 DIAGNOSIS — J9600 Acute respiratory failure, unspecified whether with hypoxia or hypercapnia: Secondary | ICD-10-CM | POA: Diagnosis not present

## 2016-09-21 DIAGNOSIS — M6281 Muscle weakness (generalized): Secondary | ICD-10-CM | POA: Diagnosis not present

## 2016-09-21 DIAGNOSIS — J189 Pneumonia, unspecified organism: Secondary | ICD-10-CM | POA: Diagnosis not present

## 2016-09-21 DIAGNOSIS — G8929 Other chronic pain: Secondary | ICD-10-CM | POA: Diagnosis not present

## 2016-09-24 DIAGNOSIS — J189 Pneumonia, unspecified organism: Secondary | ICD-10-CM | POA: Diagnosis not present

## 2016-09-24 DIAGNOSIS — M6281 Muscle weakness (generalized): Secondary | ICD-10-CM | POA: Diagnosis not present

## 2016-09-24 DIAGNOSIS — G8929 Other chronic pain: Secondary | ICD-10-CM | POA: Diagnosis not present

## 2016-09-24 DIAGNOSIS — J9600 Acute respiratory failure, unspecified whether with hypoxia or hypercapnia: Secondary | ICD-10-CM | POA: Diagnosis not present

## 2016-09-25 DIAGNOSIS — M6281 Muscle weakness (generalized): Secondary | ICD-10-CM | POA: Diagnosis not present

## 2016-09-25 DIAGNOSIS — J189 Pneumonia, unspecified organism: Secondary | ICD-10-CM | POA: Diagnosis not present

## 2016-09-25 DIAGNOSIS — J9600 Acute respiratory failure, unspecified whether with hypoxia or hypercapnia: Secondary | ICD-10-CM | POA: Diagnosis not present

## 2016-09-25 DIAGNOSIS — G8929 Other chronic pain: Secondary | ICD-10-CM | POA: Diagnosis not present

## 2016-09-27 DIAGNOSIS — G8929 Other chronic pain: Secondary | ICD-10-CM | POA: Diagnosis not present

## 2016-09-27 DIAGNOSIS — J189 Pneumonia, unspecified organism: Secondary | ICD-10-CM | POA: Diagnosis not present

## 2016-09-27 DIAGNOSIS — J9600 Acute respiratory failure, unspecified whether with hypoxia or hypercapnia: Secondary | ICD-10-CM | POA: Diagnosis not present

## 2016-09-27 DIAGNOSIS — M6281 Muscle weakness (generalized): Secondary | ICD-10-CM | POA: Diagnosis not present

## 2016-09-28 DIAGNOSIS — G8929 Other chronic pain: Secondary | ICD-10-CM | POA: Diagnosis not present

## 2016-09-28 DIAGNOSIS — J9600 Acute respiratory failure, unspecified whether with hypoxia or hypercapnia: Secondary | ICD-10-CM | POA: Diagnosis not present

## 2016-09-28 DIAGNOSIS — M6281 Muscle weakness (generalized): Secondary | ICD-10-CM | POA: Diagnosis not present

## 2016-09-28 DIAGNOSIS — J189 Pneumonia, unspecified organism: Secondary | ICD-10-CM | POA: Diagnosis not present

## 2016-09-29 DIAGNOSIS — J9600 Acute respiratory failure, unspecified whether with hypoxia or hypercapnia: Secondary | ICD-10-CM | POA: Diagnosis not present

## 2016-09-29 DIAGNOSIS — G8929 Other chronic pain: Secondary | ICD-10-CM | POA: Diagnosis not present

## 2016-09-29 DIAGNOSIS — J189 Pneumonia, unspecified organism: Secondary | ICD-10-CM | POA: Diagnosis not present

## 2016-09-29 DIAGNOSIS — M6281 Muscle weakness (generalized): Secondary | ICD-10-CM | POA: Diagnosis not present

## 2016-10-01 DIAGNOSIS — J9600 Acute respiratory failure, unspecified whether with hypoxia or hypercapnia: Secondary | ICD-10-CM | POA: Diagnosis not present

## 2016-10-01 DIAGNOSIS — G8929 Other chronic pain: Secondary | ICD-10-CM | POA: Diagnosis not present

## 2016-10-01 DIAGNOSIS — J189 Pneumonia, unspecified organism: Secondary | ICD-10-CM | POA: Diagnosis not present

## 2016-10-01 DIAGNOSIS — M6281 Muscle weakness (generalized): Secondary | ICD-10-CM | POA: Diagnosis not present

## 2016-10-02 DIAGNOSIS — J9600 Acute respiratory failure, unspecified whether with hypoxia or hypercapnia: Secondary | ICD-10-CM | POA: Diagnosis not present

## 2016-10-02 DIAGNOSIS — J189 Pneumonia, unspecified organism: Secondary | ICD-10-CM | POA: Diagnosis not present

## 2016-10-02 DIAGNOSIS — M6281 Muscle weakness (generalized): Secondary | ICD-10-CM | POA: Diagnosis not present

## 2016-10-02 DIAGNOSIS — G8929 Other chronic pain: Secondary | ICD-10-CM | POA: Diagnosis not present

## 2016-10-03 DIAGNOSIS — J9600 Acute respiratory failure, unspecified whether with hypoxia or hypercapnia: Secondary | ICD-10-CM | POA: Diagnosis not present

## 2016-10-03 DIAGNOSIS — M6281 Muscle weakness (generalized): Secondary | ICD-10-CM | POA: Diagnosis not present

## 2016-10-03 DIAGNOSIS — J189 Pneumonia, unspecified organism: Secondary | ICD-10-CM | POA: Diagnosis not present

## 2016-10-03 DIAGNOSIS — G8929 Other chronic pain: Secondary | ICD-10-CM | POA: Diagnosis not present

## 2016-10-04 DIAGNOSIS — G8929 Other chronic pain: Secondary | ICD-10-CM | POA: Diagnosis not present

## 2016-10-04 DIAGNOSIS — M6281 Muscle weakness (generalized): Secondary | ICD-10-CM | POA: Diagnosis not present

## 2016-10-04 DIAGNOSIS — J189 Pneumonia, unspecified organism: Secondary | ICD-10-CM | POA: Diagnosis not present

## 2016-10-04 DIAGNOSIS — J9600 Acute respiratory failure, unspecified whether with hypoxia or hypercapnia: Secondary | ICD-10-CM | POA: Diagnosis not present

## 2016-10-06 DIAGNOSIS — J9600 Acute respiratory failure, unspecified whether with hypoxia or hypercapnia: Secondary | ICD-10-CM | POA: Diagnosis not present

## 2016-10-08 DIAGNOSIS — J9600 Acute respiratory failure, unspecified whether with hypoxia or hypercapnia: Secondary | ICD-10-CM | POA: Diagnosis not present

## 2016-10-09 DIAGNOSIS — J9600 Acute respiratory failure, unspecified whether with hypoxia or hypercapnia: Secondary | ICD-10-CM | POA: Diagnosis not present

## 2016-10-10 DIAGNOSIS — J9600 Acute respiratory failure, unspecified whether with hypoxia or hypercapnia: Secondary | ICD-10-CM | POA: Diagnosis not present

## 2016-10-12 DIAGNOSIS — J9600 Acute respiratory failure, unspecified whether with hypoxia or hypercapnia: Secondary | ICD-10-CM | POA: Diagnosis not present

## 2016-10-13 DIAGNOSIS — J9600 Acute respiratory failure, unspecified whether with hypoxia or hypercapnia: Secondary | ICD-10-CM | POA: Diagnosis not present

## 2016-10-16 DIAGNOSIS — J9600 Acute respiratory failure, unspecified whether with hypoxia or hypercapnia: Secondary | ICD-10-CM | POA: Diagnosis not present

## 2016-10-17 DIAGNOSIS — J9600 Acute respiratory failure, unspecified whether with hypoxia or hypercapnia: Secondary | ICD-10-CM | POA: Diagnosis not present

## 2016-10-18 DIAGNOSIS — G4700 Insomnia, unspecified: Secondary | ICD-10-CM | POA: Diagnosis not present

## 2016-10-18 DIAGNOSIS — F329 Major depressive disorder, single episode, unspecified: Secondary | ICD-10-CM | POA: Diagnosis not present

## 2016-10-18 DIAGNOSIS — F250 Schizoaffective disorder, bipolar type: Secondary | ICD-10-CM | POA: Diagnosis not present

## 2016-10-18 DIAGNOSIS — F419 Anxiety disorder, unspecified: Secondary | ICD-10-CM | POA: Diagnosis not present

## 2016-10-19 DIAGNOSIS — J9600 Acute respiratory failure, unspecified whether with hypoxia or hypercapnia: Secondary | ICD-10-CM | POA: Diagnosis not present

## 2016-10-20 DIAGNOSIS — J9600 Acute respiratory failure, unspecified whether with hypoxia or hypercapnia: Secondary | ICD-10-CM | POA: Diagnosis not present

## 2016-10-22 DIAGNOSIS — J9600 Acute respiratory failure, unspecified whether with hypoxia or hypercapnia: Secondary | ICD-10-CM | POA: Diagnosis not present

## 2016-10-23 DIAGNOSIS — J9600 Acute respiratory failure, unspecified whether with hypoxia or hypercapnia: Secondary | ICD-10-CM | POA: Diagnosis not present

## 2016-10-24 DIAGNOSIS — J9600 Acute respiratory failure, unspecified whether with hypoxia or hypercapnia: Secondary | ICD-10-CM | POA: Diagnosis not present

## 2016-10-25 DIAGNOSIS — M549 Dorsalgia, unspecified: Secondary | ICD-10-CM | POA: Diagnosis not present

## 2016-10-25 DIAGNOSIS — I4891 Unspecified atrial fibrillation: Secondary | ICD-10-CM | POA: Diagnosis not present

## 2016-10-25 DIAGNOSIS — F17200 Nicotine dependence, unspecified, uncomplicated: Secondary | ICD-10-CM | POA: Diagnosis not present

## 2016-10-25 DIAGNOSIS — B1920 Unspecified viral hepatitis C without hepatic coma: Secondary | ICD-10-CM | POA: Diagnosis not present

## 2016-10-25 DIAGNOSIS — G8929 Other chronic pain: Secondary | ICD-10-CM | POA: Diagnosis not present

## 2016-10-25 DIAGNOSIS — I739 Peripheral vascular disease, unspecified: Secondary | ICD-10-CM | POA: Diagnosis not present

## 2016-10-25 DIAGNOSIS — F209 Schizophrenia, unspecified: Secondary | ICD-10-CM | POA: Diagnosis not present

## 2016-10-25 DIAGNOSIS — J9600 Acute respiratory failure, unspecified whether with hypoxia or hypercapnia: Secondary | ICD-10-CM | POA: Diagnosis not present

## 2016-10-26 DIAGNOSIS — I10 Essential (primary) hypertension: Secondary | ICD-10-CM | POA: Diagnosis not present

## 2016-10-26 DIAGNOSIS — R6889 Other general symptoms and signs: Secondary | ICD-10-CM | POA: Diagnosis not present

## 2016-10-27 DIAGNOSIS — J9600 Acute respiratory failure, unspecified whether with hypoxia or hypercapnia: Secondary | ICD-10-CM | POA: Diagnosis not present

## 2016-10-29 DIAGNOSIS — J9600 Acute respiratory failure, unspecified whether with hypoxia or hypercapnia: Secondary | ICD-10-CM | POA: Diagnosis not present

## 2016-10-30 DIAGNOSIS — J9600 Acute respiratory failure, unspecified whether with hypoxia or hypercapnia: Secondary | ICD-10-CM | POA: Diagnosis not present

## 2016-10-31 DIAGNOSIS — J9600 Acute respiratory failure, unspecified whether with hypoxia or hypercapnia: Secondary | ICD-10-CM | POA: Diagnosis not present

## 2016-11-01 DIAGNOSIS — J9600 Acute respiratory failure, unspecified whether with hypoxia or hypercapnia: Secondary | ICD-10-CM | POA: Diagnosis not present

## 2016-11-02 DIAGNOSIS — J9600 Acute respiratory failure, unspecified whether with hypoxia or hypercapnia: Secondary | ICD-10-CM | POA: Diagnosis not present

## 2016-11-04 DIAGNOSIS — J9600 Acute respiratory failure, unspecified whether with hypoxia or hypercapnia: Secondary | ICD-10-CM | POA: Diagnosis not present

## 2016-11-05 DIAGNOSIS — J9600 Acute respiratory failure, unspecified whether with hypoxia or hypercapnia: Secondary | ICD-10-CM | POA: Diagnosis not present

## 2016-11-06 DIAGNOSIS — J9600 Acute respiratory failure, unspecified whether with hypoxia or hypercapnia: Secondary | ICD-10-CM | POA: Diagnosis not present

## 2016-11-08 DIAGNOSIS — J9600 Acute respiratory failure, unspecified whether with hypoxia or hypercapnia: Secondary | ICD-10-CM | POA: Diagnosis not present

## 2016-11-10 DIAGNOSIS — J9600 Acute respiratory failure, unspecified whether with hypoxia or hypercapnia: Secondary | ICD-10-CM | POA: Diagnosis not present

## 2016-11-12 DIAGNOSIS — J9600 Acute respiratory failure, unspecified whether with hypoxia or hypercapnia: Secondary | ICD-10-CM | POA: Diagnosis not present

## 2016-11-13 DIAGNOSIS — J9600 Acute respiratory failure, unspecified whether with hypoxia or hypercapnia: Secondary | ICD-10-CM | POA: Diagnosis not present

## 2016-11-14 DIAGNOSIS — J9600 Acute respiratory failure, unspecified whether with hypoxia or hypercapnia: Secondary | ICD-10-CM | POA: Diagnosis not present

## 2016-11-15 DIAGNOSIS — J9600 Acute respiratory failure, unspecified whether with hypoxia or hypercapnia: Secondary | ICD-10-CM | POA: Diagnosis not present

## 2016-11-16 DIAGNOSIS — J9600 Acute respiratory failure, unspecified whether with hypoxia or hypercapnia: Secondary | ICD-10-CM | POA: Diagnosis not present

## 2016-11-19 DIAGNOSIS — J9600 Acute respiratory failure, unspecified whether with hypoxia or hypercapnia: Secondary | ICD-10-CM | POA: Diagnosis not present

## 2016-11-20 DIAGNOSIS — J9600 Acute respiratory failure, unspecified whether with hypoxia or hypercapnia: Secondary | ICD-10-CM | POA: Diagnosis not present

## 2016-11-21 DIAGNOSIS — J9600 Acute respiratory failure, unspecified whether with hypoxia or hypercapnia: Secondary | ICD-10-CM | POA: Diagnosis not present

## 2016-11-23 DIAGNOSIS — J9600 Acute respiratory failure, unspecified whether with hypoxia or hypercapnia: Secondary | ICD-10-CM | POA: Diagnosis not present

## 2016-11-24 DIAGNOSIS — J9600 Acute respiratory failure, unspecified whether with hypoxia or hypercapnia: Secondary | ICD-10-CM | POA: Diagnosis not present

## 2016-11-27 DIAGNOSIS — J9600 Acute respiratory failure, unspecified whether with hypoxia or hypercapnia: Secondary | ICD-10-CM | POA: Diagnosis not present

## 2016-11-28 DIAGNOSIS — J9600 Acute respiratory failure, unspecified whether with hypoxia or hypercapnia: Secondary | ICD-10-CM | POA: Diagnosis not present

## 2016-11-29 DIAGNOSIS — J9600 Acute respiratory failure, unspecified whether with hypoxia or hypercapnia: Secondary | ICD-10-CM | POA: Diagnosis not present

## 2016-11-30 DIAGNOSIS — J9600 Acute respiratory failure, unspecified whether with hypoxia or hypercapnia: Secondary | ICD-10-CM | POA: Diagnosis not present

## 2016-12-03 DIAGNOSIS — J9600 Acute respiratory failure, unspecified whether with hypoxia or hypercapnia: Secondary | ICD-10-CM | POA: Diagnosis not present

## 2016-12-04 DIAGNOSIS — J9600 Acute respiratory failure, unspecified whether with hypoxia or hypercapnia: Secondary | ICD-10-CM | POA: Diagnosis not present

## 2016-12-05 DIAGNOSIS — J9600 Acute respiratory failure, unspecified whether with hypoxia or hypercapnia: Secondary | ICD-10-CM | POA: Diagnosis not present

## 2016-12-06 DIAGNOSIS — J9600 Acute respiratory failure, unspecified whether with hypoxia or hypercapnia: Secondary | ICD-10-CM | POA: Diagnosis not present

## 2016-12-07 DIAGNOSIS — J9600 Acute respiratory failure, unspecified whether with hypoxia or hypercapnia: Secondary | ICD-10-CM | POA: Diagnosis not present

## 2016-12-10 ENCOUNTER — Telehealth: Payer: Self-pay | Admitting: Behavioral Health

## 2016-12-10 NOTE — Telephone Encounter (Signed)
Unable to reach patient at time of Pre-Visit Call.  Left message for patient to return call when available.    

## 2016-12-12 ENCOUNTER — Other Ambulatory Visit: Payer: Self-pay | Admitting: Family Medicine

## 2016-12-12 ENCOUNTER — Ambulatory Visit: Payer: Self-pay | Admitting: Family Medicine

## 2016-12-12 DIAGNOSIS — F209 Schizophrenia, unspecified: Secondary | ICD-10-CM | POA: Diagnosis not present

## 2016-12-12 DIAGNOSIS — I2510 Atherosclerotic heart disease of native coronary artery without angina pectoris: Secondary | ICD-10-CM | POA: Diagnosis not present

## 2016-12-12 DIAGNOSIS — M5136 Other intervertebral disc degeneration, lumbar region: Secondary | ICD-10-CM | POA: Diagnosis not present

## 2016-12-12 DIAGNOSIS — G894 Chronic pain syndrome: Secondary | ICD-10-CM | POA: Diagnosis not present

## 2016-12-12 DIAGNOSIS — I10 Essential (primary) hypertension: Secondary | ICD-10-CM | POA: Diagnosis not present

## 2016-12-12 DIAGNOSIS — I739 Peripheral vascular disease, unspecified: Secondary | ICD-10-CM | POA: Diagnosis not present

## 2016-12-12 NOTE — Telephone Encounter (Signed)
error:[id number] ° °

## 2016-12-13 ENCOUNTER — Ambulatory Visit: Payer: Self-pay | Admitting: Family Medicine

## 2016-12-14 ENCOUNTER — Telehealth: Payer: Self-pay

## 2016-12-14 ENCOUNTER — Ambulatory Visit: Payer: Self-pay | Admitting: Family Medicine

## 2016-12-14 NOTE — Telephone Encounter (Signed)
Patient cancelled 2pm new patient appointment due to transportation cancelling on him, patient offered he's sincere apologies and [facility] to 12/19/16.

## 2016-12-17 DIAGNOSIS — I10 Essential (primary) hypertension: Secondary | ICD-10-CM | POA: Diagnosis not present

## 2016-12-17 DIAGNOSIS — I2510 Atherosclerotic heart disease of native coronary artery without angina pectoris: Secondary | ICD-10-CM | POA: Diagnosis not present

## 2016-12-17 DIAGNOSIS — I739 Peripheral vascular disease, unspecified: Secondary | ICD-10-CM | POA: Diagnosis not present

## 2016-12-17 DIAGNOSIS — M5136 Other intervertebral disc degeneration, lumbar region: Secondary | ICD-10-CM | POA: Diagnosis not present

## 2016-12-17 DIAGNOSIS — F209 Schizophrenia, unspecified: Secondary | ICD-10-CM | POA: Diagnosis not present

## 2016-12-17 DIAGNOSIS — G894 Chronic pain syndrome: Secondary | ICD-10-CM | POA: Diagnosis not present

## 2016-12-18 ENCOUNTER — Telehealth: Payer: Self-pay | Admitting: Behavioral Health

## 2016-12-18 NOTE — Telephone Encounter (Signed)
Attempted to reach patient for Pre-Visit Call. Unable to leave a message at this time. Per recording, the voice mailbox has not been setup.

## 2016-12-19 ENCOUNTER — Encounter: Payer: Self-pay | Admitting: Family Medicine

## 2016-12-19 ENCOUNTER — Ambulatory Visit (INDEPENDENT_AMBULATORY_CARE_PROVIDER_SITE_OTHER): Payer: Medicare Other | Admitting: Family Medicine

## 2016-12-19 VITALS — BP 114/70 | HR 60 | Temp 98.1°F

## 2016-12-19 DIAGNOSIS — Z89611 Acquired absence of right leg above knee: Secondary | ICD-10-CM | POA: Diagnosis not present

## 2016-12-19 DIAGNOSIS — G8929 Other chronic pain: Secondary | ICD-10-CM

## 2016-12-19 DIAGNOSIS — Z89612 Acquired absence of left leg above knee: Secondary | ICD-10-CM

## 2016-12-19 DIAGNOSIS — M545 Low back pain: Secondary | ICD-10-CM

## 2016-12-19 DIAGNOSIS — T402X5A Adverse effect of other opioids, initial encounter: Secondary | ICD-10-CM

## 2016-12-19 DIAGNOSIS — G546 Phantom limb syndrome with pain: Secondary | ICD-10-CM | POA: Diagnosis not present

## 2016-12-19 DIAGNOSIS — R208 Other disturbances of skin sensation: Secondary | ICD-10-CM

## 2016-12-19 DIAGNOSIS — F411 Generalized anxiety disorder: Secondary | ICD-10-CM | POA: Diagnosis not present

## 2016-12-19 HISTORY — DX: Phantom limb syndrome with pain: G54.6

## 2016-12-19 MED ORDER — GABAPENTIN 300 MG PO CAPS: 300.0000 mg | ORAL_CAPSULE | Freq: Three times a day (TID) | ORAL | 3 refills | Status: DC

## 2016-12-19 MED ORDER — HYDROXYZINE HCL 50 MG PO TABS: 50.0000 mg | ORAL_TABLET | Freq: Three times a day (TID) | ORAL | 0 refills | Status: DC | PRN

## 2016-12-19 MED ORDER — TIZANIDINE HCL 2 MG PO CAPS: 2.0000 mg | ORAL_CAPSULE | Freq: Three times a day (TID) | ORAL | 2 refills | Status: DC | PRN

## 2016-12-19 MED ORDER — MELOXICAM 7.5 MG PO TABS: 7.5000 mg | ORAL_TABLET | Freq: Every day | ORAL | 2 refills | Status: DC

## 2016-12-19 MED ORDER — AMITRIPTYLINE HCL 25 MG PO TABS: 25.0000 mg | ORAL_TABLET | Freq: Every day | ORAL | 2 refills | Status: DC

## 2016-12-19 NOTE — Patient Instructions (Addendum)
If you do not hear anything about your referral in the next 1-2 weeks, call our office and ask for an update.  If your loose stools do not improve over the next week, return to clinic.
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
## 2016-12-20 ENCOUNTER — Telehealth: Payer: Self-pay | Admitting: *Deleted

## 2016-12-20 MED ORDER — TIZANIDINE HCL 2 MG PO TABS: 2.0000 mg | ORAL_TABLET | Freq: Three times a day (TID) | ORAL | 2 refills | Status: DC | PRN

## 2016-12-20 NOTE — Telephone Encounter (Signed)
Per Dr. Carmelia Roller VO, Rx changed to tablets due to Insurance not covering capsules; phoned in to pharmacy and sent electronically as well at their request/SLS 08/16

## 2016-12-24 ENCOUNTER — Telehealth: Payer: Self-pay | Admitting: Family Medicine

## 2016-12-24 DIAGNOSIS — I739 Peripheral vascular disease, unspecified: Secondary | ICD-10-CM | POA: Diagnosis not present

## 2016-12-24 DIAGNOSIS — I2510 Atherosclerotic heart disease of native coronary artery without angina pectoris: Secondary | ICD-10-CM | POA: Diagnosis not present

## 2016-12-24 DIAGNOSIS — M5136 Other intervertebral disc degeneration, lumbar region: Secondary | ICD-10-CM | POA: Diagnosis not present

## 2016-12-24 DIAGNOSIS — I10 Essential (primary) hypertension: Secondary | ICD-10-CM | POA: Diagnosis not present

## 2016-12-24 DIAGNOSIS — F209 Schizophrenia, unspecified: Secondary | ICD-10-CM | POA: Diagnosis not present

## 2016-12-24 DIAGNOSIS — G894 Chronic pain syndrome: Secondary | ICD-10-CM | POA: Diagnosis not present

## 2016-12-24 NOTE — Telephone Encounter (Signed)
Caller name: Dawn  Relation to pt: From [facility] Call back number: [phone] ext [phone] Pharmacy:  Reason for call: Dawn from [facility] wanting to inform that document that was faxed to them for pt's semi electric bed needed to be checked in box area on the document so that pt can received the equipment. Please recent document with box checked, if any questions please call Dawn at tel number mentioned above. Please advise.

## 2016-12-24 NOTE — Telephone Encounter (Signed)
HHRN notified of verbal ok per PCP.

## 2016-12-24 NOTE — Telephone Encounter (Signed)
Caller name: Merton Border  Relation to pt: Physical Therapy from [employer] back number: [phone] Pharmacy:  Reason for call: Merton Border (physical therapist from [facility]) Needing a verbal approval for pt to have physical therapy twice a wk for 4 wks. Please advise.

## 2016-12-25 DIAGNOSIS — M5136 Other intervertebral disc degeneration, lumbar region: Secondary | ICD-10-CM | POA: Diagnosis not present

## 2016-12-25 DIAGNOSIS — I10 Essential (primary) hypertension: Secondary | ICD-10-CM | POA: Diagnosis not present

## 2016-12-25 DIAGNOSIS — F209 Schizophrenia, unspecified: Secondary | ICD-10-CM | POA: Diagnosis not present

## 2016-12-25 DIAGNOSIS — I739 Peripheral vascular disease, unspecified: Secondary | ICD-10-CM | POA: Diagnosis not present

## 2016-12-25 DIAGNOSIS — I2510 Atherosclerotic heart disease of native coronary artery without angina pectoris: Secondary | ICD-10-CM | POA: Diagnosis not present

## 2016-12-25 DIAGNOSIS — G894 Chronic pain syndrome: Secondary | ICD-10-CM | POA: Diagnosis not present

## 2016-12-25 NOTE — Telephone Encounter (Signed)
Semi-electric bed box checked; paperwork resent to [facility] via fax/SLS 08/21

## 2016-12-26 DIAGNOSIS — I10 Essential (primary) hypertension: Secondary | ICD-10-CM | POA: Diagnosis not present

## 2016-12-26 DIAGNOSIS — G894 Chronic pain syndrome: Secondary | ICD-10-CM | POA: Diagnosis not present

## 2016-12-26 DIAGNOSIS — F209 Schizophrenia, unspecified: Secondary | ICD-10-CM | POA: Diagnosis not present

## 2016-12-26 DIAGNOSIS — M5136 Other intervertebral disc degeneration, lumbar region: Secondary | ICD-10-CM | POA: Diagnosis not present

## 2016-12-26 DIAGNOSIS — I2510 Atherosclerotic heart disease of native coronary artery without angina pectoris: Secondary | ICD-10-CM | POA: Diagnosis not present

## 2016-12-26 DIAGNOSIS — I739 Peripheral vascular disease, unspecified: Secondary | ICD-10-CM | POA: Diagnosis not present

## 2016-12-27 DIAGNOSIS — M5136 Other intervertebral disc degeneration, lumbar region: Secondary | ICD-10-CM | POA: Diagnosis not present

## 2016-12-27 DIAGNOSIS — F209 Schizophrenia, unspecified: Secondary | ICD-10-CM | POA: Diagnosis not present

## 2016-12-27 DIAGNOSIS — I10 Essential (primary) hypertension: Secondary | ICD-10-CM | POA: Diagnosis not present

## 2016-12-27 DIAGNOSIS — I2510 Atherosclerotic heart disease of native coronary artery without angina pectoris: Secondary | ICD-10-CM | POA: Diagnosis not present

## 2016-12-27 DIAGNOSIS — I739 Peripheral vascular disease, unspecified: Secondary | ICD-10-CM | POA: Diagnosis not present

## 2016-12-27 DIAGNOSIS — G894 Chronic pain syndrome: Secondary | ICD-10-CM | POA: Diagnosis not present

## 2016-12-28 ENCOUNTER — Telehealth: Payer: Self-pay

## 2016-12-28 DIAGNOSIS — I2510 Atherosclerotic heart disease of native coronary artery without angina pectoris: Secondary | ICD-10-CM | POA: Diagnosis not present

## 2016-12-28 DIAGNOSIS — F209 Schizophrenia, unspecified: Secondary | ICD-10-CM | POA: Diagnosis not present

## 2016-12-28 DIAGNOSIS — I10 Essential (primary) hypertension: Secondary | ICD-10-CM | POA: Diagnosis not present

## 2016-12-28 DIAGNOSIS — M5136 Other intervertebral disc degeneration, lumbar region: Secondary | ICD-10-CM | POA: Diagnosis not present

## 2016-12-28 DIAGNOSIS — G894 Chronic pain syndrome: Secondary | ICD-10-CM | POA: Diagnosis not present

## 2016-12-28 DIAGNOSIS — I739 Peripheral vascular disease, unspecified: Secondary | ICD-10-CM | POA: Diagnosis not present

## 2016-12-28 NOTE — Telephone Encounter (Signed)
Physician supplemental orders for PT received from [facility]. Form forwarded to PCP for completion.

## 2017-01-01 DIAGNOSIS — I739 Peripheral vascular disease, unspecified: Secondary | ICD-10-CM | POA: Diagnosis not present

## 2017-01-01 DIAGNOSIS — F209 Schizophrenia, unspecified: Secondary | ICD-10-CM | POA: Diagnosis not present

## 2017-01-01 DIAGNOSIS — G894 Chronic pain syndrome: Secondary | ICD-10-CM | POA: Diagnosis not present

## 2017-01-01 DIAGNOSIS — I10 Essential (primary) hypertension: Secondary | ICD-10-CM | POA: Diagnosis not present

## 2017-01-01 DIAGNOSIS — M5136 Other intervertebral disc degeneration, lumbar region: Secondary | ICD-10-CM | POA: Diagnosis not present

## 2017-01-01 DIAGNOSIS — I2510 Atherosclerotic heart disease of native coronary artery without angina pectoris: Secondary | ICD-10-CM | POA: Diagnosis not present

## 2017-01-02 ENCOUNTER — Telehealth: Payer: Self-pay | Admitting: *Deleted

## 2017-01-02 NOTE — Telephone Encounter (Signed)
Received Physician Orders from [facility] for medical supplies; forwarded to provider/SLS 08/29

## 2017-01-03 DIAGNOSIS — I739 Peripheral vascular disease, unspecified: Secondary | ICD-10-CM | POA: Diagnosis not present

## 2017-01-03 DIAGNOSIS — F209 Schizophrenia, unspecified: Secondary | ICD-10-CM | POA: Diagnosis not present

## 2017-01-03 DIAGNOSIS — I10 Essential (primary) hypertension: Secondary | ICD-10-CM | POA: Diagnosis not present

## 2017-01-03 DIAGNOSIS — G894 Chronic pain syndrome: Secondary | ICD-10-CM | POA: Diagnosis not present

## 2017-01-03 DIAGNOSIS — M5136 Other intervertebral disc degeneration, lumbar region: Secondary | ICD-10-CM | POA: Diagnosis not present

## 2017-01-03 DIAGNOSIS — I2510 Atherosclerotic heart disease of native coronary artery without angina pectoris: Secondary | ICD-10-CM | POA: Diagnosis not present

## 2017-01-10 ENCOUNTER — Telehealth: Payer: Self-pay | Admitting: Family Medicine

## 2017-01-10 DIAGNOSIS — I10 Essential (primary) hypertension: Secondary | ICD-10-CM | POA: Diagnosis not present

## 2017-01-10 DIAGNOSIS — F209 Schizophrenia, unspecified: Secondary | ICD-10-CM | POA: Diagnosis not present

## 2017-01-10 DIAGNOSIS — I739 Peripheral vascular disease, unspecified: Secondary | ICD-10-CM | POA: Diagnosis not present

## 2017-01-10 DIAGNOSIS — I2510 Atherosclerotic heart disease of native coronary artery without angina pectoris: Secondary | ICD-10-CM | POA: Diagnosis not present

## 2017-01-10 DIAGNOSIS — M5136 Other intervertebral disc degeneration, lumbar region: Secondary | ICD-10-CM | POA: Diagnosis not present

## 2017-01-10 DIAGNOSIS — G894 Chronic pain syndrome: Secondary | ICD-10-CM | POA: Diagnosis not present

## 2017-01-10 NOTE — Telephone Encounter (Signed)
Holly at [facility] called states received referral to provide services but they cannot provide services for this pt because [facility] is already providing services for the pt

## 2017-01-15 ENCOUNTER — Telehealth: Payer: Self-pay | Admitting: *Deleted

## 2017-01-15 DIAGNOSIS — M5136 Other intervertebral disc degeneration, lumbar region: Secondary | ICD-10-CM | POA: Diagnosis not present

## 2017-01-15 DIAGNOSIS — G894 Chronic pain syndrome: Secondary | ICD-10-CM | POA: Diagnosis not present

## 2017-01-15 DIAGNOSIS — I739 Peripheral vascular disease, unspecified: Secondary | ICD-10-CM | POA: Diagnosis not present

## 2017-01-15 DIAGNOSIS — I10 Essential (primary) hypertension: Secondary | ICD-10-CM | POA: Diagnosis not present

## 2017-01-15 DIAGNOSIS — F209 Schizophrenia, unspecified: Secondary | ICD-10-CM | POA: Diagnosis not present

## 2017-01-15 DIAGNOSIS — I2510 Atherosclerotic heart disease of native coronary artery without angina pectoris: Secondary | ICD-10-CM | POA: Diagnosis not present

## 2017-01-15 NOTE — Telephone Encounter (Signed)
Received Physician Orders from [facility] for Wheelchair Evaluation by a PT or OT; forwarded to provider/SLS 09/11

## 2017-01-16 NOTE — Telephone Encounter (Signed)
[name] [name] - [facility] called in to follow up on form   Evaluation is scheduled for 01/30/17  Fax back at [phone] [name] [name] [facility]

## 2017-01-17 ENCOUNTER — Other Ambulatory Visit: Payer: Self-pay | Admitting: Family Medicine

## 2017-01-17 DIAGNOSIS — F209 Schizophrenia, unspecified: Secondary | ICD-10-CM | POA: Diagnosis not present

## 2017-01-17 DIAGNOSIS — F411 Generalized anxiety disorder: Secondary | ICD-10-CM

## 2017-01-17 DIAGNOSIS — I739 Peripheral vascular disease, unspecified: Secondary | ICD-10-CM | POA: Diagnosis not present

## 2017-01-17 DIAGNOSIS — I2510 Atherosclerotic heart disease of native coronary artery without angina pectoris: Secondary | ICD-10-CM | POA: Diagnosis not present

## 2017-01-17 DIAGNOSIS — I10 Essential (primary) hypertension: Secondary | ICD-10-CM | POA: Diagnosis not present

## 2017-01-17 DIAGNOSIS — M5136 Other intervertebral disc degeneration, lumbar region: Secondary | ICD-10-CM | POA: Diagnosis not present

## 2017-01-17 DIAGNOSIS — G894 Chronic pain syndrome: Secondary | ICD-10-CM | POA: Diagnosis not present

## 2017-01-17 MED ORDER — TOPIRAMATE 100 MG PO TABS: 300.0000 mg | ORAL_TABLET | Freq: Every day | ORAL | 0 refills | Status: DC

## 2017-01-17 MED ORDER — METOPROLOL TARTRATE 50 MG PO TABS: 50.0000 mg | ORAL_TABLET | Freq: Two times a day (BID) | ORAL | 0 refills | Status: DC

## 2017-01-17 MED ORDER — FOLIC ACID 1 MG PO TABS: 1.0000 mg | ORAL_TABLET | Freq: Every day | ORAL | 0 refills | Status: DC

## 2017-01-24 NOTE — Telephone Encounter (Signed)
Debbie from [facility] brought in another form Therapist, sports page) to have it sign by provider (provider did sign we made a copy and sent it to be scanned).

## 2017-01-25 ENCOUNTER — Telehealth: Payer: Self-pay | Admitting: Family Medicine

## 2017-01-25 NOTE — Telephone Encounter (Signed)
Faxed last office notes/medication list to [employer] at fax number [phone].

## 2017-01-30 ENCOUNTER — Ambulatory Visit: Payer: Medicare Other | Admitting: Physical Therapy

## 2017-01-30 ENCOUNTER — Ambulatory Visit: Payer: Self-pay | Admitting: Family Medicine

## 2017-03-25 DIAGNOSIS — Z113 Encounter for screening for infections with a predominantly sexual mode of transmission: Secondary | ICD-10-CM | POA: Diagnosis not present

## 2017-03-25 DIAGNOSIS — Z131 Encounter for screening for diabetes mellitus: Secondary | ICD-10-CM | POA: Diagnosis not present

## 2017-03-25 DIAGNOSIS — I2510 Atherosclerotic heart disease of native coronary artery without angina pectoris: Secondary | ICD-10-CM | POA: Diagnosis not present

## 2017-03-25 DIAGNOSIS — E559 Vitamin D deficiency, unspecified: Secondary | ICD-10-CM | POA: Diagnosis not present

## 2017-03-25 DIAGNOSIS — E669 Obesity, unspecified: Secondary | ICD-10-CM | POA: Diagnosis not present

## 2017-03-25 DIAGNOSIS — J45909 Unspecified asthma, uncomplicated: Secondary | ICD-10-CM | POA: Diagnosis not present

## 2017-03-25 DIAGNOSIS — Z720 Tobacco use: Secondary | ICD-10-CM | POA: Diagnosis not present

## 2017-03-25 DIAGNOSIS — I10 Essential (primary) hypertension: Secondary | ICD-10-CM | POA: Diagnosis not present

## 2017-03-25 DIAGNOSIS — Z125 Encounter for screening for malignant neoplasm of prostate: Secondary | ICD-10-CM | POA: Diagnosis not present

## 2017-03-25 DIAGNOSIS — Z136 Encounter for screening for cardiovascular disorders: Secondary | ICD-10-CM | POA: Diagnosis not present

## 2017-03-25 DIAGNOSIS — Z01118 Encounter for examination of ears and hearing with other abnormal findings: Secondary | ICD-10-CM | POA: Diagnosis not present

## 2017-05-17 ENCOUNTER — Telehealth: Payer: Self-pay | Admitting: Family Medicine

## 2017-05-17 MED ORDER — METOPROLOL TARTRATE 50 MG PO TABS: 50.0000 mg | ORAL_TABLET | Freq: Two times a day (BID) | ORAL | 0 refills | Status: DC

## 2017-05-17 NOTE — Telephone Encounter (Signed)
Copied from CRM [id number]. Topic: Quick Communication - Rx Refill/Question >> May 17, 2017 11:25 AM Viviann Spare wrote: Medication: metoprolol tartrate (LOPRESSOR) 50 MG tablet   (PATIENT IS TOTALLY OUT OF HIS MED)  Has the patient contacted their pharmacy? Yes.    (Agent: If no, request that the patient contact the pharmacy for the refill.)  Preferred Pharmacy (with phone number or street name):   [facility] [address] [place], [place] - [address] [place] [address] Phone: [phone] Fax: [phone]   Agent: Please be advised that RX refills may take up to 3 business days. We ask that you follow-up with your pharmacy.

## 2017-05-30 ENCOUNTER — Encounter: Payer: Self-pay | Admitting: Family Medicine

## 2017-06-05 DIAGNOSIS — I2510 Atherosclerotic heart disease of native coronary artery without angina pectoris: Secondary | ICD-10-CM | POA: Diagnosis not present

## 2017-06-05 DIAGNOSIS — E669 Obesity, unspecified: Secondary | ICD-10-CM | POA: Diagnosis not present

## 2017-06-05 DIAGNOSIS — I10 Essential (primary) hypertension: Secondary | ICD-10-CM | POA: Diagnosis not present

## 2017-06-05 DIAGNOSIS — Z720 Tobacco use: Secondary | ICD-10-CM | POA: Diagnosis not present

## 2017-06-05 DIAGNOSIS — E1165 Type 2 diabetes mellitus with hyperglycemia: Secondary | ICD-10-CM | POA: Diagnosis not present

## 2017-06-05 DIAGNOSIS — E785 Hyperlipidemia, unspecified: Secondary | ICD-10-CM | POA: Diagnosis not present

## 2017-06-05 DIAGNOSIS — E559 Vitamin D deficiency, unspecified: Secondary | ICD-10-CM | POA: Diagnosis not present

## 2017-06-24 DIAGNOSIS — H5203 Hypermetropia, bilateral: Secondary | ICD-10-CM | POA: Diagnosis not present

## 2017-06-24 DIAGNOSIS — H524 Presbyopia: Secondary | ICD-10-CM | POA: Diagnosis not present

## 2017-06-24 DIAGNOSIS — E119 Type 2 diabetes mellitus without complications: Secondary | ICD-10-CM | POA: Diagnosis not present

## 2017-07-08 DIAGNOSIS — E1165 Type 2 diabetes mellitus with hyperglycemia: Secondary | ICD-10-CM | POA: Diagnosis not present

## 2017-07-26 DIAGNOSIS — E559 Vitamin D deficiency, unspecified: Secondary | ICD-10-CM | POA: Diagnosis not present

## 2017-07-26 DIAGNOSIS — E1165 Type 2 diabetes mellitus with hyperglycemia: Secondary | ICD-10-CM | POA: Diagnosis not present

## 2017-07-26 DIAGNOSIS — E669 Obesity, unspecified: Secondary | ICD-10-CM | POA: Diagnosis not present

## 2017-07-26 DIAGNOSIS — I2510 Atherosclerotic heart disease of native coronary artery without angina pectoris: Secondary | ICD-10-CM | POA: Diagnosis not present

## 2017-07-26 DIAGNOSIS — E785 Hyperlipidemia, unspecified: Secondary | ICD-10-CM | POA: Diagnosis not present

## 2017-07-26 DIAGNOSIS — I10 Essential (primary) hypertension: Secondary | ICD-10-CM | POA: Diagnosis not present

## 2017-07-26 DIAGNOSIS — Z720 Tobacco use: Secondary | ICD-10-CM | POA: Diagnosis not present

## 2017-07-26 DIAGNOSIS — I119 Hypertensive heart disease without heart failure: Secondary | ICD-10-CM | POA: Diagnosis not present

## 2017-07-26 DIAGNOSIS — R05 Cough: Secondary | ICD-10-CM | POA: Diagnosis not present

## 2017-07-26 DIAGNOSIS — R9431 Abnormal electrocardiogram [ECG] [EKG]: Secondary | ICD-10-CM | POA: Diagnosis not present

## 2017-07-31 DIAGNOSIS — I10 Essential (primary) hypertension: Secondary | ICD-10-CM | POA: Diagnosis not present

## 2017-07-31 DIAGNOSIS — E785 Hyperlipidemia, unspecified: Secondary | ICD-10-CM | POA: Diagnosis not present

## 2017-08-12 DIAGNOSIS — I2510 Atherosclerotic heart disease of native coronary artery without angina pectoris: Secondary | ICD-10-CM | POA: Diagnosis not present

## 2017-08-12 DIAGNOSIS — E1165 Type 2 diabetes mellitus with hyperglycemia: Secondary | ICD-10-CM | POA: Diagnosis not present

## 2017-08-12 DIAGNOSIS — E669 Obesity, unspecified: Secondary | ICD-10-CM | POA: Diagnosis not present

## 2017-08-12 DIAGNOSIS — E559 Vitamin D deficiency, unspecified: Secondary | ICD-10-CM | POA: Diagnosis not present

## 2017-08-12 DIAGNOSIS — I10 Essential (primary) hypertension: Secondary | ICD-10-CM | POA: Diagnosis not present

## 2017-08-12 DIAGNOSIS — E785 Hyperlipidemia, unspecified: Secondary | ICD-10-CM | POA: Diagnosis not present

## 2017-08-12 DIAGNOSIS — Z720 Tobacco use: Secondary | ICD-10-CM | POA: Diagnosis not present

## 2017-08-13 DIAGNOSIS — F330 Major depressive disorder, recurrent, mild: Secondary | ICD-10-CM | POA: Diagnosis not present

## 2017-08-13 DIAGNOSIS — F419 Anxiety disorder, unspecified: Secondary | ICD-10-CM | POA: Diagnosis not present

## 2017-08-15 DIAGNOSIS — M2450 Contracture, unspecified joint: Secondary | ICD-10-CM | POA: Diagnosis not present

## 2017-08-15 DIAGNOSIS — M62838 Other muscle spasm: Secondary | ICD-10-CM | POA: Diagnosis not present

## 2017-08-19 DIAGNOSIS — Z720 Tobacco use: Secondary | ICD-10-CM | POA: Diagnosis not present

## 2017-08-19 DIAGNOSIS — E669 Obesity, unspecified: Secondary | ICD-10-CM | POA: Diagnosis not present

## 2017-08-19 DIAGNOSIS — I2510 Atherosclerotic heart disease of native coronary artery without angina pectoris: Secondary | ICD-10-CM | POA: Diagnosis not present

## 2017-08-19 DIAGNOSIS — E559 Vitamin D deficiency, unspecified: Secondary | ICD-10-CM | POA: Diagnosis not present

## 2017-08-19 DIAGNOSIS — I10 Essential (primary) hypertension: Secondary | ICD-10-CM | POA: Diagnosis not present

## 2017-08-19 DIAGNOSIS — Z0000 Encounter for general adult medical examination without abnormal findings: Secondary | ICD-10-CM | POA: Diagnosis not present

## 2017-08-19 DIAGNOSIS — E785 Hyperlipidemia, unspecified: Secondary | ICD-10-CM | POA: Diagnosis not present

## 2017-08-19 DIAGNOSIS — E1165 Type 2 diabetes mellitus with hyperglycemia: Secondary | ICD-10-CM | POA: Diagnosis not present

## 2017-08-28 DIAGNOSIS — I10 Essential (primary) hypertension: Secondary | ICD-10-CM | POA: Diagnosis not present

## 2017-08-28 DIAGNOSIS — E785 Hyperlipidemia, unspecified: Secondary | ICD-10-CM | POA: Diagnosis not present

## 2017-08-30 DIAGNOSIS — E1165 Type 2 diabetes mellitus with hyperglycemia: Secondary | ICD-10-CM | POA: Diagnosis not present

## 2017-09-02 DIAGNOSIS — M62838 Other muscle spasm: Secondary | ICD-10-CM | POA: Diagnosis not present

## 2017-09-02 DIAGNOSIS — M2450 Contracture, unspecified joint: Secondary | ICD-10-CM | POA: Diagnosis not present

## 2017-09-06 DIAGNOSIS — Z89612 Acquired absence of left leg above knee: Secondary | ICD-10-CM | POA: Diagnosis not present

## 2017-09-06 DIAGNOSIS — M2450 Contracture, unspecified joint: Secondary | ICD-10-CM | POA: Diagnosis not present

## 2017-09-06 DIAGNOSIS — R262 Difficulty in walking, not elsewhere classified: Secondary | ICD-10-CM | POA: Diagnosis not present

## 2017-09-06 DIAGNOSIS — M62838 Other muscle spasm: Secondary | ICD-10-CM | POA: Diagnosis not present

## 2017-09-06 DIAGNOSIS — Z89611 Acquired absence of right leg above knee: Secondary | ICD-10-CM | POA: Diagnosis not present

## 2017-09-09 DIAGNOSIS — Z89612 Acquired absence of left leg above knee: Secondary | ICD-10-CM | POA: Diagnosis not present

## 2017-09-09 DIAGNOSIS — Z89611 Acquired absence of right leg above knee: Secondary | ICD-10-CM | POA: Diagnosis not present

## 2017-09-09 DIAGNOSIS — M2450 Contracture, unspecified joint: Secondary | ICD-10-CM | POA: Diagnosis not present

## 2017-09-09 DIAGNOSIS — R262 Difficulty in walking, not elsewhere classified: Secondary | ICD-10-CM | POA: Diagnosis not present

## 2017-09-09 DIAGNOSIS — M62838 Other muscle spasm: Secondary | ICD-10-CM | POA: Diagnosis not present

## 2017-09-12 DIAGNOSIS — F419 Anxiety disorder, unspecified: Secondary | ICD-10-CM | POA: Diagnosis not present

## 2017-09-12 DIAGNOSIS — F330 Major depressive disorder, recurrent, mild: Secondary | ICD-10-CM | POA: Diagnosis not present

## 2017-09-13 DIAGNOSIS — Z89611 Acquired absence of right leg above knee: Secondary | ICD-10-CM | POA: Diagnosis not present

## 2017-09-13 DIAGNOSIS — Z89612 Acquired absence of left leg above knee: Secondary | ICD-10-CM | POA: Diagnosis not present

## 2017-09-13 DIAGNOSIS — M62838 Other muscle spasm: Secondary | ICD-10-CM | POA: Diagnosis not present

## 2017-09-13 DIAGNOSIS — M2450 Contracture, unspecified joint: Secondary | ICD-10-CM | POA: Diagnosis not present

## 2017-09-13 DIAGNOSIS — R262 Difficulty in walking, not elsewhere classified: Secondary | ICD-10-CM | POA: Diagnosis not present

## 2017-09-16 DIAGNOSIS — M2450 Contracture, unspecified joint: Secondary | ICD-10-CM | POA: Diagnosis not present

## 2017-09-16 DIAGNOSIS — R262 Difficulty in walking, not elsewhere classified: Secondary | ICD-10-CM | POA: Diagnosis not present

## 2017-09-16 DIAGNOSIS — M62838 Other muscle spasm: Secondary | ICD-10-CM | POA: Diagnosis not present

## 2017-09-16 DIAGNOSIS — Z89612 Acquired absence of left leg above knee: Secondary | ICD-10-CM | POA: Diagnosis not present

## 2017-09-16 DIAGNOSIS — Z89611 Acquired absence of right leg above knee: Secondary | ICD-10-CM | POA: Diagnosis not present

## 2017-09-23 DIAGNOSIS — M2450 Contracture, unspecified joint: Secondary | ICD-10-CM | POA: Diagnosis not present

## 2017-09-23 DIAGNOSIS — Z89612 Acquired absence of left leg above knee: Secondary | ICD-10-CM | POA: Diagnosis not present

## 2017-09-23 DIAGNOSIS — R262 Difficulty in walking, not elsewhere classified: Secondary | ICD-10-CM | POA: Diagnosis not present

## 2017-09-23 DIAGNOSIS — M62838 Other muscle spasm: Secondary | ICD-10-CM | POA: Diagnosis not present

## 2017-09-23 DIAGNOSIS — Z89611 Acquired absence of right leg above knee: Secondary | ICD-10-CM | POA: Diagnosis not present

## 2017-09-25 DIAGNOSIS — E1165 Type 2 diabetes mellitus with hyperglycemia: Secondary | ICD-10-CM | POA: Diagnosis not present

## 2017-09-25 DIAGNOSIS — I10 Essential (primary) hypertension: Secondary | ICD-10-CM | POA: Diagnosis not present

## 2017-09-27 DIAGNOSIS — M62838 Other muscle spasm: Secondary | ICD-10-CM | POA: Diagnosis not present

## 2017-09-27 DIAGNOSIS — M2450 Contracture, unspecified joint: Secondary | ICD-10-CM | POA: Diagnosis not present

## 2017-09-27 DIAGNOSIS — Z89612 Acquired absence of left leg above knee: Secondary | ICD-10-CM | POA: Diagnosis not present

## 2017-09-27 DIAGNOSIS — Z89611 Acquired absence of right leg above knee: Secondary | ICD-10-CM | POA: Diagnosis not present

## 2017-09-27 DIAGNOSIS — R262 Difficulty in walking, not elsewhere classified: Secondary | ICD-10-CM | POA: Diagnosis not present

## 2017-10-10 DIAGNOSIS — Z89611 Acquired absence of right leg above knee: Secondary | ICD-10-CM | POA: Diagnosis not present

## 2017-10-10 DIAGNOSIS — G894 Chronic pain syndrome: Secondary | ICD-10-CM | POA: Diagnosis not present

## 2017-10-10 DIAGNOSIS — M24551 Contracture, right hip: Secondary | ICD-10-CM | POA: Diagnosis not present

## 2017-10-10 DIAGNOSIS — R262 Difficulty in walking, not elsewhere classified: Secondary | ICD-10-CM | POA: Diagnosis not present

## 2017-10-10 DIAGNOSIS — M24552 Contracture, left hip: Secondary | ICD-10-CM | POA: Diagnosis not present

## 2017-10-10 DIAGNOSIS — M62838 Other muscle spasm: Secondary | ICD-10-CM | POA: Diagnosis not present

## 2017-10-10 DIAGNOSIS — Z89612 Acquired absence of left leg above knee: Secondary | ICD-10-CM | POA: Diagnosis not present

## 2017-10-15 DIAGNOSIS — M24551 Contracture, right hip: Secondary | ICD-10-CM | POA: Diagnosis not present

## 2017-10-15 DIAGNOSIS — Z89611 Acquired absence of right leg above knee: Secondary | ICD-10-CM | POA: Diagnosis not present

## 2017-10-15 DIAGNOSIS — G894 Chronic pain syndrome: Secondary | ICD-10-CM | POA: Diagnosis not present

## 2017-10-15 DIAGNOSIS — R262 Difficulty in walking, not elsewhere classified: Secondary | ICD-10-CM | POA: Diagnosis not present

## 2017-10-15 DIAGNOSIS — M62838 Other muscle spasm: Secondary | ICD-10-CM | POA: Diagnosis not present

## 2017-10-15 DIAGNOSIS — M24552 Contracture, left hip: Secondary | ICD-10-CM | POA: Diagnosis not present

## 2017-11-29 DIAGNOSIS — I10 Essential (primary) hypertension: Secondary | ICD-10-CM | POA: Diagnosis not present

## 2017-11-29 DIAGNOSIS — E785 Hyperlipidemia, unspecified: Secondary | ICD-10-CM | POA: Diagnosis not present

## 2017-11-29 DIAGNOSIS — E669 Obesity, unspecified: Secondary | ICD-10-CM | POA: Diagnosis not present

## 2017-11-29 DIAGNOSIS — E559 Vitamin D deficiency, unspecified: Secondary | ICD-10-CM | POA: Diagnosis not present

## 2017-11-29 DIAGNOSIS — Z720 Tobacco use: Secondary | ICD-10-CM | POA: Diagnosis not present

## 2017-11-29 DIAGNOSIS — E1165 Type 2 diabetes mellitus with hyperglycemia: Secondary | ICD-10-CM | POA: Diagnosis not present

## 2017-11-29 DIAGNOSIS — I2510 Atherosclerotic heart disease of native coronary artery without angina pectoris: Secondary | ICD-10-CM | POA: Diagnosis not present

## 2017-12-02 DIAGNOSIS — F330 Major depressive disorder, recurrent, mild: Secondary | ICD-10-CM | POA: Diagnosis not present

## 2017-12-02 DIAGNOSIS — F419 Anxiety disorder, unspecified: Secondary | ICD-10-CM | POA: Diagnosis not present

## 2017-12-03 DIAGNOSIS — M545 Low back pain: Secondary | ICD-10-CM | POA: Diagnosis not present

## 2017-12-03 DIAGNOSIS — Z89612 Acquired absence of left leg above knee: Secondary | ICD-10-CM | POA: Diagnosis not present

## 2017-12-03 DIAGNOSIS — G894 Chronic pain syndrome: Secondary | ICD-10-CM | POA: Diagnosis not present

## 2017-12-03 DIAGNOSIS — G546 Phantom limb syndrome with pain: Secondary | ICD-10-CM | POA: Diagnosis not present

## 2017-12-03 DIAGNOSIS — Z89611 Acquired absence of right leg above knee: Secondary | ICD-10-CM | POA: Diagnosis not present

## 2018-01-03 DIAGNOSIS — G894 Chronic pain syndrome: Secondary | ICD-10-CM | POA: Diagnosis not present

## 2018-01-03 DIAGNOSIS — G8921 Chronic pain due to trauma: Secondary | ICD-10-CM | POA: Diagnosis not present

## 2018-01-03 DIAGNOSIS — Z89611 Acquired absence of right leg above knee: Secondary | ICD-10-CM | POA: Diagnosis not present

## 2018-01-03 DIAGNOSIS — Z89612 Acquired absence of left leg above knee: Secondary | ICD-10-CM | POA: Diagnosis not present

## 2018-01-03 DIAGNOSIS — G546 Phantom limb syndrome with pain: Secondary | ICD-10-CM | POA: Diagnosis not present

## 2018-01-03 DIAGNOSIS — M545 Low back pain: Secondary | ICD-10-CM | POA: Diagnosis not present

## 2018-01-09 DIAGNOSIS — Z720 Tobacco use: Secondary | ICD-10-CM | POA: Diagnosis not present

## 2018-01-09 DIAGNOSIS — I2510 Atherosclerotic heart disease of native coronary artery without angina pectoris: Secondary | ICD-10-CM | POA: Diagnosis not present

## 2018-01-09 DIAGNOSIS — E785 Hyperlipidemia, unspecified: Secondary | ICD-10-CM | POA: Diagnosis not present

## 2018-01-09 DIAGNOSIS — I10 Essential (primary) hypertension: Secondary | ICD-10-CM | POA: Diagnosis not present

## 2018-01-09 DIAGNOSIS — E669 Obesity, unspecified: Secondary | ICD-10-CM | POA: Diagnosis not present

## 2018-01-09 DIAGNOSIS — Z89612 Acquired absence of left leg above knee: Secondary | ICD-10-CM | POA: Diagnosis not present

## 2018-01-09 DIAGNOSIS — E1165 Type 2 diabetes mellitus with hyperglycemia: Secondary | ICD-10-CM | POA: Diagnosis not present

## 2018-01-09 DIAGNOSIS — E559 Vitamin D deficiency, unspecified: Secondary | ICD-10-CM | POA: Diagnosis not present

## 2018-01-09 DIAGNOSIS — Z89611 Acquired absence of right leg above knee: Secondary | ICD-10-CM | POA: Diagnosis not present

## 2018-01-24 DIAGNOSIS — F419 Anxiety disorder, unspecified: Secondary | ICD-10-CM | POA: Diagnosis not present

## 2018-01-24 DIAGNOSIS — F330 Major depressive disorder, recurrent, mild: Secondary | ICD-10-CM | POA: Diagnosis not present

## 2018-01-30 DIAGNOSIS — Z89612 Acquired absence of left leg above knee: Secondary | ICD-10-CM | POA: Diagnosis not present

## 2018-01-30 DIAGNOSIS — G894 Chronic pain syndrome: Secondary | ICD-10-CM | POA: Diagnosis not present

## 2018-01-30 DIAGNOSIS — G546 Phantom limb syndrome with pain: Secondary | ICD-10-CM | POA: Diagnosis not present

## 2018-01-30 DIAGNOSIS — M545 Low back pain: Secondary | ICD-10-CM | POA: Diagnosis not present

## 2018-01-30 DIAGNOSIS — Z89611 Acquired absence of right leg above knee: Secondary | ICD-10-CM | POA: Diagnosis not present

## 2018-02-10 DIAGNOSIS — E669 Obesity, unspecified: Secondary | ICD-10-CM | POA: Diagnosis not present

## 2018-02-10 DIAGNOSIS — I10 Essential (primary) hypertension: Secondary | ICD-10-CM | POA: Diagnosis not present

## 2018-02-10 DIAGNOSIS — Z720 Tobacco use: Secondary | ICD-10-CM | POA: Diagnosis not present

## 2018-02-10 DIAGNOSIS — E785 Hyperlipidemia, unspecified: Secondary | ICD-10-CM | POA: Diagnosis not present

## 2018-02-10 DIAGNOSIS — E1165 Type 2 diabetes mellitus with hyperglycemia: Secondary | ICD-10-CM | POA: Diagnosis not present

## 2018-02-10 DIAGNOSIS — I2510 Atherosclerotic heart disease of native coronary artery without angina pectoris: Secondary | ICD-10-CM | POA: Diagnosis not present

## 2018-02-10 DIAGNOSIS — E559 Vitamin D deficiency, unspecified: Secondary | ICD-10-CM | POA: Diagnosis not present

## 2018-02-18 DIAGNOSIS — F4312 Post-traumatic stress disorder, chronic: Secondary | ICD-10-CM | POA: Diagnosis not present

## 2018-02-18 DIAGNOSIS — F321 Major depressive disorder, single episode, moderate: Secondary | ICD-10-CM | POA: Diagnosis not present

## 2018-02-18 DIAGNOSIS — F411 Generalized anxiety disorder: Secondary | ICD-10-CM | POA: Diagnosis not present

## 2018-02-28 DIAGNOSIS — E1165 Type 2 diabetes mellitus with hyperglycemia: Secondary | ICD-10-CM | POA: Diagnosis not present

## 2018-02-28 DIAGNOSIS — Z720 Tobacco use: Secondary | ICD-10-CM | POA: Diagnosis not present

## 2018-02-28 DIAGNOSIS — I10 Essential (primary) hypertension: Secondary | ICD-10-CM | POA: Diagnosis not present

## 2018-02-28 DIAGNOSIS — E785 Hyperlipidemia, unspecified: Secondary | ICD-10-CM | POA: Diagnosis not present

## 2018-02-28 DIAGNOSIS — M79642 Pain in left hand: Secondary | ICD-10-CM | POA: Diagnosis not present

## 2018-02-28 DIAGNOSIS — E669 Obesity, unspecified: Secondary | ICD-10-CM | POA: Diagnosis not present

## 2018-02-28 DIAGNOSIS — I2510 Atherosclerotic heart disease of native coronary artery without angina pectoris: Secondary | ICD-10-CM | POA: Diagnosis not present

## 2018-02-28 DIAGNOSIS — R21 Rash and other nonspecific skin eruption: Secondary | ICD-10-CM | POA: Diagnosis not present

## 2018-02-28 DIAGNOSIS — L03116 Cellulitis of left lower limb: Secondary | ICD-10-CM | POA: Diagnosis not present

## 2018-02-28 DIAGNOSIS — E559 Vitamin D deficiency, unspecified: Secondary | ICD-10-CM | POA: Diagnosis not present

## 2018-03-13 DIAGNOSIS — Z89612 Acquired absence of left leg above knee: Secondary | ICD-10-CM | POA: Diagnosis not present

## 2018-03-13 DIAGNOSIS — Z89611 Acquired absence of right leg above knee: Secondary | ICD-10-CM | POA: Diagnosis not present

## 2018-03-13 DIAGNOSIS — G894 Chronic pain syndrome: Secondary | ICD-10-CM | POA: Diagnosis not present

## 2018-03-13 DIAGNOSIS — M545 Low back pain: Secondary | ICD-10-CM | POA: Diagnosis not present

## 2018-03-13 DIAGNOSIS — G546 Phantom limb syndrome with pain: Secondary | ICD-10-CM | POA: Diagnosis not present

## 2018-03-18 DIAGNOSIS — F4312 Post-traumatic stress disorder, chronic: Secondary | ICD-10-CM | POA: Diagnosis not present

## 2018-03-18 DIAGNOSIS — F411 Generalized anxiety disorder: Secondary | ICD-10-CM | POA: Diagnosis not present

## 2018-03-18 DIAGNOSIS — F321 Major depressive disorder, single episode, moderate: Secondary | ICD-10-CM | POA: Diagnosis not present

## 2018-06-29 DIAGNOSIS — Z91040 Latex allergy status: Secondary | ICD-10-CM | POA: Diagnosis not present

## 2018-06-29 DIAGNOSIS — J9601 Acute respiratory failure with hypoxia: Secondary | ICD-10-CM | POA: Diagnosis not present

## 2018-06-29 DIAGNOSIS — E785 Hyperlipidemia, unspecified: Secondary | ICD-10-CM | POA: Diagnosis present

## 2018-06-29 DIAGNOSIS — R0682 Tachypnea, not elsewhere classified: Secondary | ICD-10-CM | POA: Diagnosis not present

## 2018-06-29 DIAGNOSIS — Z79891 Long term (current) use of opiate analgesic: Secondary | ICD-10-CM | POA: Diagnosis not present

## 2018-06-29 DIAGNOSIS — R079 Chest pain, unspecified: Secondary | ICD-10-CM | POA: Diagnosis not present

## 2018-06-29 DIAGNOSIS — Z885 Allergy status to narcotic agent status: Secondary | ICD-10-CM | POA: Diagnosis not present

## 2018-06-29 DIAGNOSIS — E871 Hypo-osmolality and hyponatremia: Secondary | ICD-10-CM | POA: Diagnosis not present

## 2018-06-29 DIAGNOSIS — R140 Abdominal distension (gaseous): Secondary | ICD-10-CM | POA: Diagnosis not present

## 2018-06-29 DIAGNOSIS — R7989 Other specified abnormal findings of blood chemistry: Secondary | ICD-10-CM | POA: Diagnosis not present

## 2018-06-29 DIAGNOSIS — I748 Embolism and thrombosis of other arteries: Secondary | ICD-10-CM | POA: Diagnosis not present

## 2018-06-29 DIAGNOSIS — B182 Chronic viral hepatitis C: Secondary | ICD-10-CM | POA: Diagnosis not present

## 2018-06-29 DIAGNOSIS — R739 Hyperglycemia, unspecified: Secondary | ICD-10-CM | POA: Diagnosis not present

## 2018-06-29 DIAGNOSIS — Z91048 Other nonmedicinal substance allergy status: Secondary | ICD-10-CM | POA: Diagnosis not present

## 2018-06-29 DIAGNOSIS — J811 Chronic pulmonary edema: Secondary | ICD-10-CM | POA: Diagnosis not present

## 2018-06-29 DIAGNOSIS — J441 Chronic obstructive pulmonary disease with (acute) exacerbation: Secondary | ICD-10-CM | POA: Diagnosis not present

## 2018-06-29 DIAGNOSIS — R9431 Abnormal electrocardiogram [ECG] [EKG]: Secondary | ICD-10-CM | POA: Diagnosis not present

## 2018-06-29 DIAGNOSIS — Z7982 Long term (current) use of aspirin: Secondary | ICD-10-CM | POA: Diagnosis not present

## 2018-06-29 DIAGNOSIS — F17210 Nicotine dependence, cigarettes, uncomplicated: Secondary | ICD-10-CM | POA: Diagnosis present

## 2018-06-29 DIAGNOSIS — J9691 Respiratory failure, unspecified with hypoxia: Secondary | ICD-10-CM | POA: Diagnosis not present

## 2018-06-29 DIAGNOSIS — G9340 Encephalopathy, unspecified: Secondary | ICD-10-CM | POA: Diagnosis not present

## 2018-06-29 DIAGNOSIS — Z452 Encounter for adjustment and management of vascular access device: Secondary | ICD-10-CM | POA: Diagnosis not present

## 2018-06-29 DIAGNOSIS — I82C11 Acute embolism and thrombosis of right internal jugular vein: Secondary | ICD-10-CM | POA: Diagnosis present

## 2018-06-29 DIAGNOSIS — I4891 Unspecified atrial fibrillation: Secondary | ICD-10-CM | POA: Diagnosis not present

## 2018-06-29 DIAGNOSIS — Z888 Allergy status to other drugs, medicaments and biological substances status: Secondary | ICD-10-CM | POA: Diagnosis not present

## 2018-06-29 DIAGNOSIS — Z9049 Acquired absence of other specified parts of digestive tract: Secondary | ICD-10-CM | POA: Diagnosis not present

## 2018-06-29 DIAGNOSIS — R062 Wheezing: Secondary | ICD-10-CM | POA: Diagnosis not present

## 2018-06-29 DIAGNOSIS — Z9911 Dependence on respirator [ventilator] status: Secondary | ICD-10-CM | POA: Diagnosis not present

## 2018-06-29 DIAGNOSIS — Z9989 Dependence on other enabling machines and devices: Secondary | ICD-10-CM | POA: Diagnosis not present

## 2018-06-29 DIAGNOSIS — Z7901 Long term (current) use of anticoagulants: Secondary | ICD-10-CM | POA: Diagnosis not present

## 2018-06-29 DIAGNOSIS — Z89611 Acquired absence of right leg above knee: Secondary | ICD-10-CM | POA: Diagnosis not present

## 2018-06-29 DIAGNOSIS — R109 Unspecified abdominal pain: Secondary | ICD-10-CM | POA: Diagnosis not present

## 2018-06-29 DIAGNOSIS — I509 Heart failure, unspecified: Secondary | ICD-10-CM | POA: Diagnosis not present

## 2018-06-29 DIAGNOSIS — R072 Precordial pain: Secondary | ICD-10-CM | POA: Diagnosis not present

## 2018-06-29 DIAGNOSIS — E1165 Type 2 diabetes mellitus with hyperglycemia: Secondary | ICD-10-CM | POA: Diagnosis present

## 2018-06-29 DIAGNOSIS — Z7952 Long term (current) use of systemic steroids: Secondary | ICD-10-CM | POA: Diagnosis not present

## 2018-06-29 DIAGNOSIS — I82B11 Acute embolism and thrombosis of right subclavian vein: Secondary | ICD-10-CM | POA: Diagnosis present

## 2018-06-29 DIAGNOSIS — Z880 Allergy status to penicillin: Secondary | ICD-10-CM | POA: Diagnosis not present

## 2018-06-29 DIAGNOSIS — F419 Anxiety disorder, unspecified: Secondary | ICD-10-CM | POA: Diagnosis present

## 2018-06-29 DIAGNOSIS — J90 Pleural effusion, not elsewhere classified: Secondary | ICD-10-CM | POA: Diagnosis present

## 2018-06-29 DIAGNOSIS — B1920 Unspecified viral hepatitis C without hepatic coma: Secondary | ICD-10-CM | POA: Diagnosis present

## 2018-06-29 DIAGNOSIS — I743 Embolism and thrombosis of arteries of the lower extremities: Secondary | ICD-10-CM | POA: Diagnosis not present

## 2018-06-29 DIAGNOSIS — R0602 Shortness of breath: Secondary | ICD-10-CM | POA: Diagnosis not present

## 2018-06-29 DIAGNOSIS — Z89612 Acquired absence of left leg above knee: Secondary | ICD-10-CM | POA: Diagnosis not present

## 2018-06-29 DIAGNOSIS — G40509 Epileptic seizures related to external causes, not intractable, without status epilepticus: Secondary | ICD-10-CM | POA: Diagnosis not present

## 2018-06-29 DIAGNOSIS — R000 Tachycardia, unspecified: Secondary | ICD-10-CM | POA: Diagnosis not present

## 2018-06-29 DIAGNOSIS — Z833 Family history of diabetes mellitus: Secondary | ICD-10-CM | POA: Diagnosis not present

## 2018-07-02 ENCOUNTER — Other Ambulatory Visit (HOSPITAL_COMMUNITY): Payer: Medicare Other

## 2018-07-02 ENCOUNTER — Inpatient Hospital Stay
Admission: RE | Admit: 2018-07-02 | Discharge: 2018-08-28 | Payer: Medicare Other | Source: Other Acute Inpatient Hospital | Attending: Internal Medicine | Admitting: Internal Medicine

## 2018-07-02 DIAGNOSIS — Z931 Gastrostomy status: Secondary | ICD-10-CM | POA: Diagnosis not present

## 2018-07-02 DIAGNOSIS — Z880 Allergy status to penicillin: Secondary | ICD-10-CM | POA: Diagnosis not present

## 2018-07-02 DIAGNOSIS — B1920 Unspecified viral hepatitis C without hepatic coma: Secondary | ICD-10-CM | POA: Diagnosis present

## 2018-07-02 DIAGNOSIS — R6510 Systemic inflammatory response syndrome (SIRS) of non-infectious origin without acute organ dysfunction: Secondary | ICD-10-CM | POA: Diagnosis not present

## 2018-07-02 DIAGNOSIS — I110 Hypertensive heart disease with heart failure: Secondary | ICD-10-CM | POA: Diagnosis not present

## 2018-07-02 DIAGNOSIS — I5022 Chronic systolic (congestive) heart failure: Secondary | ICD-10-CM | POA: Diagnosis not present

## 2018-07-02 DIAGNOSIS — J441 Chronic obstructive pulmonary disease with (acute) exacerbation: Secondary | ICD-10-CM | POA: Diagnosis present

## 2018-07-02 DIAGNOSIS — Z431 Encounter for attention to gastrostomy: Secondary | ICD-10-CM | POA: Diagnosis not present

## 2018-07-02 DIAGNOSIS — J9811 Atelectasis: Secondary | ICD-10-CM | POA: Diagnosis not present

## 2018-07-02 DIAGNOSIS — I330 Acute and subacute infective endocarditis: Secondary | ICD-10-CM | POA: Diagnosis not present

## 2018-07-02 DIAGNOSIS — L602 Onychogryphosis: Secondary | ICD-10-CM

## 2018-07-02 DIAGNOSIS — J9610 Chronic respiratory failure, unspecified whether with hypoxia or hypercapnia: Secondary | ICD-10-CM | POA: Diagnosis not present

## 2018-07-02 DIAGNOSIS — J90 Pleural effusion, not elsewhere classified: Secondary | ICD-10-CM

## 2018-07-02 DIAGNOSIS — F4312 Post-traumatic stress disorder, chronic: Secondary | ICD-10-CM | POA: Diagnosis present

## 2018-07-02 DIAGNOSIS — Z888 Allergy status to other drugs, medicaments and biological substances status: Secondary | ICD-10-CM | POA: Diagnosis not present

## 2018-07-02 DIAGNOSIS — J449 Chronic obstructive pulmonary disease, unspecified: Secondary | ICD-10-CM | POA: Diagnosis present

## 2018-07-02 DIAGNOSIS — Z89611 Acquired absence of right leg above knee: Secondary | ICD-10-CM | POA: Diagnosis not present

## 2018-07-02 DIAGNOSIS — J9601 Acute respiratory failure with hypoxia: Secondary | ICD-10-CM | POA: Diagnosis not present

## 2018-07-02 DIAGNOSIS — I509 Heart failure, unspecified: Secondary | ICD-10-CM

## 2018-07-02 DIAGNOSIS — Z91040 Latex allergy status: Secondary | ICD-10-CM | POA: Diagnosis not present

## 2018-07-02 DIAGNOSIS — Z934 Other artificial openings of gastrointestinal tract status: Secondary | ICD-10-CM | POA: Diagnosis not present

## 2018-07-02 DIAGNOSIS — J810 Acute pulmonary edema: Secondary | ICD-10-CM | POA: Diagnosis not present

## 2018-07-02 DIAGNOSIS — I482 Chronic atrial fibrillation, unspecified: Secondary | ICD-10-CM | POA: Diagnosis present

## 2018-07-02 DIAGNOSIS — J9611 Chronic respiratory failure with hypoxia: Secondary | ICD-10-CM | POA: Diagnosis not present

## 2018-07-02 DIAGNOSIS — R0603 Acute respiratory distress: Secondary | ICD-10-CM

## 2018-07-02 DIAGNOSIS — J9600 Acute respiratory failure, unspecified whether with hypoxia or hypercapnia: Secondary | ICD-10-CM | POA: Diagnosis not present

## 2018-07-02 DIAGNOSIS — R188 Other ascites: Secondary | ICD-10-CM | POA: Diagnosis not present

## 2018-07-02 DIAGNOSIS — R791 Abnormal coagulation profile: Secondary | ICD-10-CM | POA: Diagnosis not present

## 2018-07-02 DIAGNOSIS — J811 Chronic pulmonary edema: Secondary | ICD-10-CM | POA: Diagnosis not present

## 2018-07-02 DIAGNOSIS — R0602 Shortness of breath: Secondary | ICD-10-CM

## 2018-07-02 DIAGNOSIS — I5021 Acute systolic (congestive) heart failure: Secondary | ICD-10-CM | POA: Diagnosis present

## 2018-07-02 DIAGNOSIS — I82621 Acute embolism and thrombosis of deep veins of right upper extremity: Secondary | ICD-10-CM | POA: Diagnosis not present

## 2018-07-02 DIAGNOSIS — N319 Neuromuscular dysfunction of bladder, unspecified: Secondary | ICD-10-CM | POA: Diagnosis present

## 2018-07-02 DIAGNOSIS — Z4659 Encounter for fitting and adjustment of other gastrointestinal appliance and device: Secondary | ICD-10-CM

## 2018-07-02 DIAGNOSIS — Z89612 Acquired absence of left leg above knee: Secondary | ICD-10-CM | POA: Diagnosis not present

## 2018-07-02 DIAGNOSIS — I82B11 Acute embolism and thrombosis of right subclavian vein: Secondary | ICD-10-CM | POA: Diagnosis not present

## 2018-07-02 DIAGNOSIS — R000 Tachycardia, unspecified: Secondary | ICD-10-CM | POA: Diagnosis not present

## 2018-07-02 DIAGNOSIS — G40909 Epilepsy, unspecified, not intractable, without status epilepticus: Secondary | ICD-10-CM | POA: Diagnosis present

## 2018-07-02 DIAGNOSIS — Z452 Encounter for adjustment and management of vascular access device: Secondary | ICD-10-CM | POA: Diagnosis not present

## 2018-07-02 DIAGNOSIS — N400 Enlarged prostate without lower urinary tract symptoms: Secondary | ICD-10-CM | POA: Diagnosis not present

## 2018-07-02 DIAGNOSIS — J189 Pneumonia, unspecified organism: Secondary | ICD-10-CM | POA: Diagnosis not present

## 2018-07-02 DIAGNOSIS — J9691 Respiratory failure, unspecified with hypoxia: Secondary | ICD-10-CM | POA: Diagnosis not present

## 2018-07-02 DIAGNOSIS — Z8619 Personal history of other infectious and parasitic diseases: Secondary | ICD-10-CM | POA: Diagnosis not present

## 2018-07-02 DIAGNOSIS — R7881 Bacteremia: Secondary | ICD-10-CM | POA: Diagnosis not present

## 2018-07-02 DIAGNOSIS — R1310 Dysphagia, unspecified: Secondary | ICD-10-CM | POA: Diagnosis not present

## 2018-07-02 DIAGNOSIS — B182 Chronic viral hepatitis C: Secondary | ICD-10-CM | POA: Diagnosis present

## 2018-07-02 DIAGNOSIS — I339 Acute and subacute endocarditis, unspecified: Secondary | ICD-10-CM | POA: Diagnosis not present

## 2018-07-02 DIAGNOSIS — F17200 Nicotine dependence, unspecified, uncomplicated: Secondary | ICD-10-CM | POA: Diagnosis present

## 2018-07-02 DIAGNOSIS — J9690 Respiratory failure, unspecified, unspecified whether with hypoxia or hypercapnia: Secondary | ICD-10-CM | POA: Diagnosis not present

## 2018-07-02 DIAGNOSIS — J181 Lobar pneumonia, unspecified organism: Secondary | ICD-10-CM | POA: Diagnosis present

## 2018-07-02 DIAGNOSIS — I420 Dilated cardiomyopathy: Secondary | ICD-10-CM | POA: Diagnosis not present

## 2018-07-02 DIAGNOSIS — Z7901 Long term (current) use of anticoagulants: Secondary | ICD-10-CM | POA: Diagnosis not present

## 2018-07-02 DIAGNOSIS — E440 Moderate protein-calorie malnutrition: Secondary | ICD-10-CM | POA: Diagnosis present

## 2018-07-02 DIAGNOSIS — I4891 Unspecified atrial fibrillation: Secondary | ICD-10-CM | POA: Diagnosis not present

## 2018-07-02 DIAGNOSIS — F419 Anxiety disorder, unspecified: Secondary | ICD-10-CM | POA: Diagnosis present

## 2018-07-02 DIAGNOSIS — K567 Ileus, unspecified: Secondary | ICD-10-CM

## 2018-07-02 DIAGNOSIS — J80 Acute respiratory distress syndrome: Secondary | ICD-10-CM

## 2018-07-02 DIAGNOSIS — R6511 Systemic inflammatory response syndrome (SIRS) of non-infectious origin with acute organ dysfunction: Secondary | ICD-10-CM | POA: Diagnosis not present

## 2018-07-02 DIAGNOSIS — N200 Calculus of kidney: Secondary | ICD-10-CM | POA: Diagnosis not present

## 2018-07-02 DIAGNOSIS — L539 Erythematous condition, unspecified: Secondary | ICD-10-CM | POA: Diagnosis present

## 2018-07-02 DIAGNOSIS — E119 Type 2 diabetes mellitus without complications: Secondary | ICD-10-CM | POA: Diagnosis not present

## 2018-07-02 DIAGNOSIS — J9621 Acute and chronic respiratory failure with hypoxia: Secondary | ICD-10-CM | POA: Diagnosis present

## 2018-07-02 DIAGNOSIS — Y95 Nosocomial condition: Secondary | ICD-10-CM | POA: Diagnosis present

## 2018-07-02 DIAGNOSIS — Z91048 Other nonmedicinal substance allergy status: Secondary | ICD-10-CM | POA: Diagnosis not present

## 2018-07-02 DIAGNOSIS — Z1624 Resistance to multiple antibiotics: Secondary | ICD-10-CM | POA: Diagnosis not present

## 2018-07-02 DIAGNOSIS — I480 Paroxysmal atrial fibrillation: Secondary | ICD-10-CM | POA: Diagnosis not present

## 2018-07-02 DIAGNOSIS — I82C11 Acute embolism and thrombosis of right internal jugular vein: Secondary | ICD-10-CM | POA: Diagnosis present

## 2018-07-02 DIAGNOSIS — E785 Hyperlipidemia, unspecified: Secondary | ICD-10-CM | POA: Diagnosis not present

## 2018-07-02 DIAGNOSIS — I2510 Atherosclerotic heart disease of native coronary artery without angina pectoris: Secondary | ICD-10-CM | POA: Diagnosis present

## 2018-07-02 DIAGNOSIS — J440 Chronic obstructive pulmonary disease with acute lower respiratory infection: Secondary | ICD-10-CM | POA: Diagnosis not present

## 2018-07-02 DIAGNOSIS — E46 Unspecified protein-calorie malnutrition: Secondary | ICD-10-CM | POA: Diagnosis not present

## 2018-07-02 DIAGNOSIS — I340 Nonrheumatic mitral (valve) insufficiency: Secondary | ICD-10-CM | POA: Diagnosis not present

## 2018-07-02 DIAGNOSIS — B351 Tinea unguium: Secondary | ICD-10-CM | POA: Diagnosis not present

## 2018-07-02 DIAGNOSIS — I358 Other nonrheumatic aortic valve disorders: Secondary | ICD-10-CM | POA: Diagnosis not present

## 2018-07-02 DIAGNOSIS — Z9911 Dependence on respirator [ventilator] status: Secondary | ICD-10-CM | POA: Diagnosis not present

## 2018-07-02 DIAGNOSIS — Z4682 Encounter for fitting and adjustment of non-vascular catheter: Secondary | ICD-10-CM | POA: Diagnosis not present

## 2018-07-02 DIAGNOSIS — J9620 Acute and chronic respiratory failure, unspecified whether with hypoxia or hypercapnia: Secondary | ICD-10-CM | POA: Diagnosis not present

## 2018-07-02 DIAGNOSIS — E1165 Type 2 diabetes mellitus with hyperglycemia: Secondary | ICD-10-CM | POA: Diagnosis present

## 2018-07-02 DIAGNOSIS — J156 Pneumonia due to other aerobic Gram-negative bacteria: Secondary | ICD-10-CM | POA: Diagnosis not present

## 2018-07-02 DIAGNOSIS — Z9289 Personal history of other medical treatment: Secondary | ICD-10-CM

## 2018-07-02 DIAGNOSIS — E876 Hypokalemia: Secondary | ICD-10-CM | POA: Diagnosis present

## 2018-07-02 DIAGNOSIS — I252 Old myocardial infarction: Secondary | ICD-10-CM | POA: Diagnosis not present

## 2018-07-02 DIAGNOSIS — R5381 Other malaise: Secondary | ICD-10-CM | POA: Diagnosis not present

## 2018-07-02 DIAGNOSIS — Z930 Tracheostomy status: Secondary | ICD-10-CM | POA: Diagnosis not present

## 2018-07-02 DIAGNOSIS — R7303 Prediabetes: Secondary | ICD-10-CM | POA: Diagnosis not present

## 2018-07-02 DIAGNOSIS — N390 Urinary tract infection, site not specified: Secondary | ICD-10-CM | POA: Diagnosis not present

## 2018-07-02 DIAGNOSIS — F319 Bipolar disorder, unspecified: Secondary | ICD-10-CM | POA: Diagnosis present

## 2018-07-02 DIAGNOSIS — S065X0A Traumatic subdural hemorrhage without loss of consciousness, initial encounter: Secondary | ICD-10-CM | POA: Diagnosis not present

## 2018-07-02 HISTORY — DX: Chronic atrial fibrillation, unspecified: I48.20

## 2018-07-02 HISTORY — DX: Acute systolic (congestive) heart failure: I50.21

## 2018-07-02 HISTORY — DX: Acute and chronic respiratory failure with hypoxia: J96.21

## 2018-07-02 HISTORY — DX: Chronic obstructive pulmonary disease, unspecified: J44.9

## 2018-07-02 HISTORY — DX: Unspecified intracranial injury with loss of consciousness of unspecified duration, initial encounter: S06.9X9A

## 2018-07-02 HISTORY — DX: Lobar pneumonia, unspecified organism: J18.1

## 2018-07-02 LAB — CBC WITH DIFFERENTIAL/PLATELET
Abs Immature Granulocytes: 0.07 10*3/uL (ref 0.00–0.07)
Basophils Absolute: 0 10*3/uL (ref 0.0–0.1)
Basophils Relative: 0 %
Eosinophils Absolute: 0 10*3/uL (ref 0.0–0.5)
Eosinophils Relative: 0 %
HCT: 43.4 % (ref 39.0–52.0)
Hemoglobin: 14.2 g/dL (ref 13.0–17.0)
IMMATURE GRANULOCYTES: 1 %
Lymphocytes Relative: 14 %
Lymphs Abs: 1.9 10*3/uL (ref 0.7–4.0)
MCH: 29.5 pg (ref 26.0–34.0)
MCHC: 32.7 g/dL (ref 30.0–36.0)
MCV: 90 fL (ref 80.0–100.0)
Monocytes Absolute: 1.3 10*3/uL — ABNORMAL HIGH (ref 0.1–1.0)
Monocytes Relative: 10 %
NEUTROS PCT: 75 %
NRBC: 0 % (ref 0.0–0.2)
Neutro Abs: 10.2 10*3/uL — ABNORMAL HIGH (ref 1.7–7.7)
Platelets: 196 10*3/uL (ref 150–400)
RBC: 4.82 MIL/uL (ref 4.22–5.81)
RDW: 15.2 % (ref 11.5–15.5)
WBC: 13.5 10*3/uL — ABNORMAL HIGH (ref 4.0–10.5)

## 2018-07-02 LAB — BLOOD GAS, ARTERIAL
Acid-Base Excess: 3.8 mmol/L — ABNORMAL HIGH (ref 0.0–2.0)
Bicarbonate: 26.8 mmol/L (ref 20.0–28.0)
FIO2: 80
O2 Saturation: 91 %
PCO2 ART: 33.5 mmHg (ref 32.0–48.0)
PEEP: 8 cmH2O
Patient temperature: 98.6
RATE: 25 resp/min
VT: 440 mL
pH, Arterial: 7.514 — ABNORMAL HIGH (ref 7.350–7.450)
pO2, Arterial: 59.8 mmHg — ABNORMAL LOW (ref 83.0–108.0)

## 2018-07-02 LAB — PROTIME-INR
INR: 1.2 (ref 0.8–1.2)
Prothrombin Time: 15.1 seconds (ref 11.4–15.2)

## 2018-07-02 LAB — APTT: aPTT: 42 seconds — ABNORMAL HIGH (ref 24–36)

## 2018-07-03 ENCOUNTER — Other Ambulatory Visit (HOSPITAL_COMMUNITY): Payer: Medicare Other

## 2018-07-03 ENCOUNTER — Encounter: Payer: Self-pay | Admitting: Internal Medicine

## 2018-07-03 DIAGNOSIS — J449 Chronic obstructive pulmonary disease, unspecified: Secondary | ICD-10-CM | POA: Diagnosis not present

## 2018-07-03 DIAGNOSIS — I5021 Acute systolic (congestive) heart failure: Secondary | ICD-10-CM | POA: Diagnosis present

## 2018-07-03 DIAGNOSIS — J9621 Acute and chronic respiratory failure with hypoxia: Secondary | ICD-10-CM | POA: Diagnosis not present

## 2018-07-03 DIAGNOSIS — I482 Chronic atrial fibrillation, unspecified: Secondary | ICD-10-CM | POA: Diagnosis not present

## 2018-07-03 DIAGNOSIS — J181 Lobar pneumonia, unspecified organism: Secondary | ICD-10-CM | POA: Diagnosis present

## 2018-07-03 LAB — BASIC METABOLIC PANEL
Anion gap: 8 (ref 5–15)
BUN: 30 mg/dL — ABNORMAL HIGH (ref 8–23)
CO2: 28 mmol/L (ref 22–32)
Calcium: 7.8 mg/dL — ABNORMAL LOW (ref 8.9–10.3)
Chloride: 99 mmol/L (ref 98–111)
Creatinine, Ser: 0.99 mg/dL (ref 0.61–1.24)
GFR calc Af Amer: >60 mL/min (ref >60)
Glucose, Bld: 155 mg/dL — ABNORMAL HIGH (ref 70–99)
POTASSIUM: 3.9 mmol/L (ref 3.5–5.1)
Sodium: 135 mmol/L (ref 135–145)

## 2018-07-03 LAB — HEPARIN LEVEL (UNFRACTIONATED)
Heparin Unfractionated: 0.23 IU/mL — ABNORMAL LOW (ref 0.30–0.70)
Heparin Unfractionated: 0.47 IU/mL (ref 0.30–0.70)

## 2018-07-03 MED ORDER — HEPARIN SOD (PORCINE) IN D5W 100 UNIT/ML IV SOLN
18.00 | INTRAVENOUS | Status: DC
Start: ? — End: 2018-07-03

## 2018-07-03 MED ORDER — GENERIC EXTERNAL MEDICATION
.00 | Status: DC
Start: ? — End: 2018-07-03

## 2018-07-03 MED ORDER — FAMOTIDINE 20 MG/2ML IV SOLN
20.00 | INTRAVENOUS | Status: DC
Start: 2018-07-03 — End: 2018-07-03

## 2018-07-03 MED ORDER — ATORVASTATIN CALCIUM 10 MG PO TABS
10.00 | ORAL_TABLET | ORAL | Status: DC
Start: 2018-07-03 — End: 2018-07-03

## 2018-07-03 MED ORDER — IPRATROPIUM BROMIDE HFA 17 MCG/ACT IN AERS
2.00 | INHALATION_SPRAY | RESPIRATORY_TRACT | Status: DC
Start: 2018-07-03 — End: 2018-07-03

## 2018-07-03 MED ORDER — DEXTROSE 10 % IV SOLN
125.00 | INTRAVENOUS | Status: DC
Start: ? — End: 2018-07-03

## 2018-07-03 MED ORDER — SALINE NASAL SPRAY 0.65 % NA SOLN
1.00 | NASAL | Status: DC
Start: ? — End: 2018-07-03

## 2018-07-03 MED ORDER — ASPIRIN 81 MG PO CHEW
81.00 | CHEWABLE_TABLET | ORAL | Status: DC
Start: 2018-07-03 — End: 2018-07-03

## 2018-07-03 MED ORDER — GENERIC EXTERNAL MEDICATION
5.00 | Status: DC
Start: ? — End: 2018-07-03

## 2018-07-03 MED ORDER — ONDANSETRON HCL 4 MG/2ML IJ SOLN
4.00 | INTRAMUSCULAR | Status: DC
Start: ? — End: 2018-07-03

## 2018-07-03 MED ORDER — INSULIN LISPRO 100 UNIT/ML ~~LOC~~ SOLN
2.00 | SUBCUTANEOUS | Status: DC
Start: 2018-07-03 — End: 2018-07-03

## 2018-07-03 MED ORDER — AZITHROMYCIN 250 MG PO TABS
500.00 | ORAL_TABLET | ORAL | Status: DC
Start: 2018-07-03 — End: 2018-07-03

## 2018-07-03 MED ORDER — PROPOFOL 100 MG/10ML IV EMUL
0.00 | INTRAVENOUS | Status: DC
Start: ? — End: 2018-07-03

## 2018-07-03 MED ORDER — HYDROXYZINE HCL 25 MG PO TABS
50.00 | ORAL_TABLET | ORAL | Status: DC
Start: ? — End: 2018-07-03

## 2018-07-03 MED ORDER — ALBUTEROL SULFATE HFA 108 (90 BASE) MCG/ACT IN AERS
2.00 | INHALATION_SPRAY | RESPIRATORY_TRACT | Status: DC
Start: 2018-07-03 — End: 2018-07-03

## 2018-07-03 MED ORDER — HEPARIN SOD (PORCINE) IN D5W 100 UNIT/ML IV SOLN
40.00 | INTRAVENOUS | Status: DC
Start: ? — End: 2018-07-03

## 2018-07-03 MED ORDER — ACETAMINOPHEN 325 MG PO TABS
650.00 | ORAL_TABLET | ORAL | Status: DC
Start: ? — End: 2018-07-03

## 2018-07-03 MED ORDER — METOPROLOL TARTRATE 25 MG PO TABS
12.50 | ORAL_TABLET | ORAL | Status: DC
Start: 2018-07-03 — End: 2018-07-03

## 2018-07-03 MED ORDER — HEPARIN SOD (PORCINE) IN D5W 100 UNIT/ML IV SOLN
80.00 | INTRAVENOUS | Status: DC
Start: ? — End: 2018-07-03

## 2018-07-03 MED ORDER — FENTANYL CITRATE (PF) 50 MCG/ML IJ SOLN
25.00 | INTRAMUSCULAR | Status: DC
Start: ? — End: 2018-07-03
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
## 2018-07-04 DIAGNOSIS — J449 Chronic obstructive pulmonary disease, unspecified: Secondary | ICD-10-CM | POA: Diagnosis not present

## 2018-07-04 DIAGNOSIS — J9621 Acute and chronic respiratory failure with hypoxia: Secondary | ICD-10-CM | POA: Diagnosis not present

## 2018-07-04 DIAGNOSIS — I5021 Acute systolic (congestive) heart failure: Secondary | ICD-10-CM | POA: Diagnosis not present

## 2018-07-04 DIAGNOSIS — I482 Chronic atrial fibrillation, unspecified: Secondary | ICD-10-CM | POA: Diagnosis not present

## 2018-07-04 LAB — HEPARIN LEVEL (UNFRACTIONATED)
Heparin Unfractionated: 0.53 IU/mL (ref 0.30–0.70)
Heparin Unfractionated: 0.25 IU/mL — ABNORMAL LOW (ref 0.30–0.70)
Heparin Unfractionated: 0.23 IU/mL — ABNORMAL LOW (ref 0.30–0.70)
Heparin Unfractionated: 0.26 IU/mL — ABNORMAL LOW (ref 0.30–0.70)

## 2018-07-04 NOTE — Progress Notes (Signed)
Pulmonary Critical Care Medicine [facility] [place]   PULMONARY CRITICAL CARE SERVICE  PROGRESS NOTE  Date of Service: 07/04/2018  [name] [name]  ZOX:[id number]  DOB: 03-Oct-1955   DOA: 07/02/2018  Referring Physician: Carron Curie, MD  HPI: [name] [name] is a 63 y.o. male seen for follow up of Acute on Chronic Respiratory Failure.  He unfortunately continues to remain on 80% FiO2 spoke with respiratory therapy to titrate PEEP so that we can try to wean the FiO2 down.  Remains orally intubated.  Chest x-ray is reviewed not showing much improvement  Medications: Reviewed on Rounds  Physical Exam:  Vitals: Temperature 96.5 pulse 87 respiratory 25 blood pressure 114/85 saturations 97%  Ventilator Settings on assist control FiO2 80% tidal volume 449 PEEP 8  . General: Comfortable at this time . Eyes: Grossly normal lids, irises & conjunctiva . ENT: grossly tongue is normal . Neck: no obvious mass . Cardiovascular: S1 S2 normal no gallop . Respiratory: Coarse breath sounds noted bilaterally . Abdomen: soft . Skin: no rash seen on limited exam . Musculoskeletal: not rigid . Psychiatric:unable to assess . Neurologic: no seizure no involuntary movements         Lab Data:   Basic Metabolic Panel: Recent Labs  Lab 07/03/18 0402  NA 135  K 3.9  CL 99  CO2 28  GLUCOSE 155*  BUN 30*  CREATININE 0.99  CALCIUM 7.8*    ABG: Recent Labs  Lab 07/02/18 2227  PHART 7.514*  PCO2ART 33.5  PO2ART 59.8*  HCO3 26.8  O2SAT 91.0    Liver Function Tests: No results for input(s): AST, ALT, ALKPHOS, BILITOT, PROT, ALBUMIN in the last 168 hours. No results for input(s): LIPASE, AMYLASE in the last 168 hours. No results for input(s): AMMONIA in the last 168 hours.  CBC: Recent Labs  Lab 07/02/18 2251  WBC 13.5*  NEUTROABS 10.2*  HGB 14.2  HCT 43.4  MCV 90.0  PLT 196    Cardiac Enzymes: No results for input(s): CKTOTAL, CKMB, CKMBINDEX, TROPONINI in the  last 168 hours.  BNP (last 3 results) No results for input(s): BNP in the last 8760 hours.  ProBNP (last 3 results) No results for input(s): PROBNP in the last 8760 hours.  Radiological Exams: Dg Chest Port 1 View  Result Date: 07/02/2018 CLINICAL DATA:  Respiratory failure EXAM: PORTABLE CHEST 1 VIEW COMPARISON:  07/02/2018 FINDINGS: Endotracheal tube, left central line and NG tube remain in place, unchanged. Cardiomegaly, bilateral moderate to large layering effusions and diffuse bilateral airspace disease, most confluent in the lower lobes. Overall aeration slightly improved since prior study. IMPRESSION: Continued cardiomegaly with layering bilateral effusions and diffuse bilateral airspace disease, most confluent in the lower lobes. Slight improvement since prior study. Electronically Signed   By: Charlett Nose M.D.   On: 07/02/2018 23:55   Dg Abd Portable 1v  Result Date: 07/03/2018 CLINICAL DATA:  Ileus. EXAM: PORTABLE ABDOMEN - 1 VIEW COMPARISON:  07/02/2018. FINDINGS: NG tube noted with tip over the stomach. No bowel distention. No free air. Degenerative change lumbar spine. Bibasilar atelectasis. IMPRESSION: 1.  NG tube noted with tip over the stomach. 2.  Bibasilar atelectasis. Electronically Signed   By: Maisie Fus  Register   On: 07/03/2018 14:10   Dg Abd Portable 1v  Result Date: 07/02/2018 CLINICAL DATA:  OG tube placement EXAM: PORTABLE ABDOMEN - 1 VIEW COMPARISON:  06/30/2018 FINDINGS: OG tube tip is in the mid stomach. IMPRESSION: OG tube tip in the mid stomach. Electronically  Signed   By: Charlett Nose M.D.   On: 07/02/2018 22:46    Assessment/Plan Active Problems:   Acute on chronic respiratory failure with hypoxia (HCC)   COPD, severe (HCC)   Chronic atrial fibrillation   Acute systolic heart failure (HCC)   Lobar pneumonia, unspecified organism (HCC)   1. Acute on chronic respiratory failure with hypoxia we will continue with full vent support.  Titrate oxygen and PEEP  as tolerated hopefully we can wean the FiO2 down a little bit since his saturations are improved 2. Severe COPD continue with present supportive care.  Discussed possibility of increase more aggressive steroid therapy and bronchodilator therapy 3. Chronic atrial fibrillation currently rate controlled 4. Acute systolic heart failure chest x-ray results reviewed as above. 5. Lobar pneumonia treated we will continue to monitor.   I have personally seen and evaluated the patient, evaluated laboratory and imaging results, formulated the assessment and plan and placed orders.  Patient is critically ill in danger of cardiac arrest and death time 35 minutes patient has a high risk airway The Patient requires high complexity decision making for assessment and support.  Case was discussed on Rounds with the Respiratory Therapy Staff  Yevonne Pax, MD [facility] Pulmonary Critical Care Medicine Sleep Medicine

## 2018-07-05 ENCOUNTER — Other Ambulatory Visit (HOSPITAL_COMMUNITY): Payer: Medicare Other

## 2018-07-05 DIAGNOSIS — I5021 Acute systolic (congestive) heart failure: Secondary | ICD-10-CM | POA: Diagnosis not present

## 2018-07-05 DIAGNOSIS — J9621 Acute and chronic respiratory failure with hypoxia: Secondary | ICD-10-CM | POA: Diagnosis not present

## 2018-07-05 DIAGNOSIS — I482 Chronic atrial fibrillation, unspecified: Secondary | ICD-10-CM | POA: Diagnosis not present

## 2018-07-05 DIAGNOSIS — J449 Chronic obstructive pulmonary disease, unspecified: Secondary | ICD-10-CM | POA: Diagnosis not present

## 2018-07-05 LAB — CBC
HCT: 48.9 % (ref 39.0–52.0)
Hemoglobin: 16.2 g/dL (ref 13.0–17.0)
MCH: 29.7 pg (ref 26.0–34.0)
MCHC: 33.1 g/dL (ref 30.0–36.0)
MCV: 89.7 fL (ref 80.0–100.0)
Platelets: 127 10*3/uL — ABNORMAL LOW (ref 150–400)
RBC: 5.45 MIL/uL (ref 4.22–5.81)
RDW: 15.1 % (ref 11.5–15.5)
WBC: 10.2 10*3/uL (ref 4.0–10.5)
nRBC: 0 % (ref 0.0–0.2)

## 2018-07-05 LAB — HEPARIN LEVEL (UNFRACTIONATED)
Heparin Unfractionated: 0.36 IU/mL (ref 0.30–0.70)
Heparin Unfractionated: 0.41 IU/mL (ref 0.30–0.70)
Heparin Unfractionated: <0.1 IU/mL — ABNORMAL LOW (ref 0.30–0.70)

## 2018-07-05 LAB — BASIC METABOLIC PANEL
Anion gap: 13 (ref 5–15)
BUN: 19 mg/dL (ref 8–23)
CALCIUM: 8.4 mg/dL — AB (ref 8.9–10.3)
CO2: 20 mmol/L — ABNORMAL LOW (ref 22–32)
Chloride: 102 mmol/L (ref 98–111)
Creatinine, Ser: 0.76 mg/dL (ref 0.61–1.24)
GFR calc Af Amer: >60 mL/min (ref >60)
GFR calc non Af Amer: >60 mL/min (ref >60)
Glucose, Bld: 154 mg/dL — ABNORMAL HIGH (ref 70–99)
Potassium: 4.4 mmol/L (ref 3.5–5.1)
Sodium: 135 mmol/L (ref 135–145)

## 2018-07-05 LAB — MAGNESIUM: Magnesium: 1.7 mg/dL (ref 1.7–2.4)

## 2018-07-05 NOTE — Progress Notes (Addendum)
Pulmonary Critical Care Medicine [facility] [place]   PULMONARY CRITICAL CARE SERVICE  PROGRESS NOTE  Date of Service: 07/05/2018  [name] [name]  PQA:[id number]  DOB: May 27, 1955   DOA: 07/02/2018  Referring Physician: Carron Curie, MD  HPI: [name] [name] is a 63 y.o. male seen for follow up of Acute on Chronic Respiratory Failure.  Patient remains intubated with ET tube.  He continues on full support with an FiO2 that has now decreased down to 60%.  He continues to have copious secretions.  Medications: Reviewed on Rounds  Physical Exam:  Vitals: Pulse 87 respirations 25 BP 106/61 O2 sat 95% temp 97.1  Ventilator Settings ventilator mode AC VC rate of 28 tidal volume 450 PEEP of 8 FiO2 60%.  . General: Comfortable at this time . Eyes: Grossly normal lids, irises & conjunctiva . ENT: grossly tongue is normal . Neck: no obvious mass . Cardiovascular: S1 S2 normal no gallop . Respiratory: Coarse breath sounds . Abdomen: soft . Skin: no rash seen on limited exam . Musculoskeletal: not rigid . Psychiatric:unable to assess . Neurologic: no seizure no involuntary movements         Lab Data:   Basic Metabolic Panel: Recent Labs  Lab 07/03/18 0402 07/05/18 1050  NA 135 135  K 3.9 4.4  CL 99 102  CO2 28 20*  GLUCOSE 155* 154*  BUN 30* 19  CREATININE 0.99 0.76  CALCIUM 7.8* 8.4*  MG  --  1.7    ABG: Recent Labs  Lab 07/02/18 2227  PHART 7.514*  PCO2ART 33.5  PO2ART 59.8*  HCO3 26.8  O2SAT 91.0    Liver Function Tests: No results for input(s): AST, ALT, ALKPHOS, BILITOT, PROT, ALBUMIN in the last 168 hours. No results for input(s): LIPASE, AMYLASE in the last 168 hours. No results for input(s): AMMONIA in the last 168 hours.  CBC: Recent Labs  Lab 07/02/18 2251 07/05/18 1050  WBC 13.5* 10.2  NEUTROABS 10.2*  --   HGB 14.2 16.2  HCT 43.4 48.9  MCV 90.0 89.7  PLT 196 127*    Cardiac Enzymes: No results for input(s): CKTOTAL, CKMB,  CKMBINDEX, TROPONINI in the last 168 hours.  BNP (last 3 results) No results for input(s): BNP in the last 8760 hours.  ProBNP (last 3 results) No results for input(s): PROBNP in the last 8760 hours.  Radiological Exams: Dg Abd Portable 1v  Result Date: 07/03/2018 CLINICAL DATA:  Ileus. EXAM: PORTABLE ABDOMEN - 1 VIEW COMPARISON:  07/02/2018. FINDINGS: NG tube noted with tip over the stomach. No bowel distention. No free air. Degenerative change lumbar spine. Bibasilar atelectasis. IMPRESSION: 1.  NG tube noted with tip over the stomach. 2.  Bibasilar atelectasis. Electronically Signed   By: Maisie Fus  Register   On: 07/03/2018 14:10    Assessment/Plan Active Problems:   Acute on chronic respiratory failure with hypoxia (HCC)   COPD, severe (HCC)   Chronic atrial fibrillation   Acute systolic heart failure (HCC)   Lobar pneumonia, unspecified organism (HCC)   1. Acute on chronic respiratory failure with hypoxia continue with full ventilator support at this time.  Titrate oxygen and PEEP as tolerated with the intent of weaning the FiO2 down.  Continue aggressive pulmonary toilet and secretion management. 2. Severe COPD continue with present supportive care 3. Chronic atrial fibrillation rate controlled currently. 4. Acute systolic heart failure stable at this time. 5. Lobar pneumonia treated continue to monitor   I have personally seen and evaluated the patient,  evaluated laboratory and imaging results, formulated the assessment and plan and placed orders. The Patient requires high complexity decision making for assessment and support.  Case was discussed on Rounds with the Respiratory Therapy Staff  Allyne Gee, MD [facility] Pulmonary Critical Care Medicine Sleep Medicine

## 2018-07-06 DIAGNOSIS — J449 Chronic obstructive pulmonary disease, unspecified: Secondary | ICD-10-CM | POA: Diagnosis not present

## 2018-07-06 DIAGNOSIS — I5021 Acute systolic (congestive) heart failure: Secondary | ICD-10-CM | POA: Diagnosis not present

## 2018-07-06 DIAGNOSIS — I482 Chronic atrial fibrillation, unspecified: Secondary | ICD-10-CM | POA: Diagnosis not present

## 2018-07-06 DIAGNOSIS — J9621 Acute and chronic respiratory failure with hypoxia: Secondary | ICD-10-CM | POA: Diagnosis not present

## 2018-07-06 LAB — CK TOTAL AND CKMB (NOT AT ARMC)
CK, MB: 1.9 ng/mL (ref 0.5–5.0)
CK, MB: 1.5 ng/mL (ref 0.5–5.0)
CK, MB: 2.1 ng/mL (ref 0.5–5.0)
Relative Index: INVALID (ref 0.0–2.5)
Relative Index: INVALID (ref 0.0–2.5)
Relative Index: INVALID (ref 0.0–2.5)
Total CK: 87 U/L (ref 49–397)
Total CK: 77 U/L (ref 49–397)
Total CK: 63 U/L (ref 49–397)

## 2018-07-06 LAB — MAGNESIUM: Magnesium: 1.9 mg/dL (ref 1.7–2.4)

## 2018-07-06 LAB — CBC
HCT: 43.8 % (ref 39.0–52.0)
Hemoglobin: 14.6 g/dL (ref 13.0–17.0)
MCH: 29.9 pg (ref 26.0–34.0)
MCHC: 33.3 g/dL (ref 30.0–36.0)
MCV: 89.6 fL (ref 80.0–100.0)
Platelets: 153 10*3/uL (ref 150–400)
RBC: 4.89 MIL/uL (ref 4.22–5.81)
RDW: 15 % (ref 11.5–15.5)
WBC: 10.8 10*3/uL — ABNORMAL HIGH (ref 4.0–10.5)
nRBC: 0 % (ref 0.0–0.2)

## 2018-07-06 LAB — TROPONIN I
Troponin I: 0.04 ng/mL (ref <0.03)
Troponin I: 0.04 ng/mL (ref <0.03)
Troponin I: 0.04 ng/mL (ref <0.03)

## 2018-07-06 LAB — HEPARIN LEVEL (UNFRACTIONATED)
Heparin Unfractionated: 0.23 IU/mL — ABNORMAL LOW (ref 0.30–0.70)
Heparin Unfractionated: 0.48 IU/mL (ref 0.30–0.70)
Heparin Unfractionated: 0.24 IU/mL — ABNORMAL LOW (ref 0.30–0.70)

## 2018-07-06 NOTE — Progress Notes (Addendum)
Pulmonary Critical Care Medicine [facility] [place]   PULMONARY CRITICAL CARE SERVICE  PROGRESS NOTE  Date of Service: 07/06/2018  [name] [name]  VWA:[id number]  DOB: 1955-06-11   DOA: 07/02/2018  Referring Physician: Carron Curie, MD  HPI: [name] [name] is a 63 y.o. male seen for follow up of Acute on Chronic Respiratory Failure.  Patient remains intubated endotracheally.  Continues on full support with FiO2 this decreased today to 50%.  He is tolerating this well at this time.   Medications: Reviewed on Rounds  Physical Exam:  Vitals: Pulse 88 respirations 25 BP 92/62 O2 sat 98% temp 98.4  Ventilator Settings ventilator mode AC VC rate of 25 tidal volume 450 PEEP of 8 FiO2 50%.  . General: Comfortable at this time . Eyes: Grossly normal lids, irises & conjunctiva . ENT: grossly tongue is normal . Neck: no obvious mass . Cardiovascular: S1 S2 normal no gallop . Respiratory: Coarse breath sounds . Abdomen: soft . Skin: no rash seen on limited exam . Musculoskeletal: not rigid . Psychiatric:unable to assess . Neurologic: no seizure no involuntary movements         Lab Data:   Basic Metabolic Panel: Recent Labs  Lab 07/03/18 0402 07/05/18 1050 07/06/18 0221  NA 135 135  --   K 3.9 4.4  --   CL 99 102  --   CO2 28 20*  --   GLUCOSE 155* 154*  --   BUN 30* 19  --   CREATININE 0.99 0.76  --   CALCIUM 7.8* 8.4*  --   MG  --  1.7 1.9    ABG: Recent Labs  Lab 07/02/18 2227  PHART 7.514*  PCO2ART 33.5  PO2ART 59.8*  HCO3 26.8  O2SAT 91.0    Liver Function Tests: No results for input(s): AST, ALT, ALKPHOS, BILITOT, PROT, ALBUMIN in the last 168 hours. No results for input(s): LIPASE, AMYLASE in the last 168 hours. No results for input(s): AMMONIA in the last 168 hours.  CBC: Recent Labs  Lab 07/02/18 2251 07/05/18 1050 07/06/18 0221  WBC 13.5* 10.2 10.8*  NEUTROABS 10.2*  --   --   HGB 14.2 16.2 14.6  HCT 43.4 48.9 43.8  MCV 90.0  89.7 89.6  PLT 196 127* 153    Cardiac Enzymes: Recent Labs  Lab 07/06/18 0221 07/06/18 0715 07/06/18 1210  CKTOTAL 87 77 63  CKMB 1.9 1.5 2.1  TROPONINI 0.04* 0.04* 0.04*    BNP (last 3 results) No results for input(s): BNP in the last 8760 hours.  ProBNP (last 3 results) No results for input(s): PROBNP in the last 8760 hours.  Radiological Exams: Dg Chest Port 1 View  Result Date: 07/05/2018 CLINICAL DATA:  Tachycardia EXAM: PORTABLE CHEST 1 VIEW COMPARISON:  07/02/2010 FINDINGS: Endotracheal tube, gastric catheter and left jugular central line are again seen and stable. Cardiac shadow is stable. Previously seen effusions are not well visualized on today's exam and may be somewhat positional in nature. No focal infiltrate is seen. IMPRESSION: Significant improved aeration bilaterally. Previously seen effusions are not well visualized and may be positional in nature. Electronically Signed   By: Alcide Clever M.D.   On: 07/05/2018 16:28    Assessment/Plan Active Problems:   Acute on chronic respiratory failure with hypoxia (HCC)   COPD, severe (HCC)   Chronic atrial fibrillation   Acute systolic heart failure (HCC)   Lobar pneumonia, unspecified organism (HCC)   1. Acute on chronic respiratory failure with hypoxia  continue with full ventilator support at this time.  Titrate oxygen and PEEP as tolerated with the intent of weaning his FiO2 down.  His FiO2 has been decreased to 50% today and he appears to be doing well.  RT will continue to wean.  Continue aggressive pulmonary toilet and secretion management 2. Severe COPD continue with present supportive care 3. Chronic atrial fibrillation rate controlled 4. Acute systolic heart failure stable at this time 5. Lobar pneumonia treated continue to monitor   I have personally seen and evaluated the patient, evaluated laboratory and imaging results, formulated the assessment and plan and placed orders.  Time spent 35 minutes  patient remains intubated critically ill high risk airway The Patient requires high complexity decision making for assessment and support.  Case was discussed on Rounds with the Respiratory Therapy Staff  Yevonne Pax, MD [facility] Pulmonary Critical Care Medicine Sleep Medicine

## 2018-07-07 ENCOUNTER — Other Ambulatory Visit (HOSPITAL_COMMUNITY): Payer: Medicare Other

## 2018-07-07 DIAGNOSIS — J449 Chronic obstructive pulmonary disease, unspecified: Secondary | ICD-10-CM | POA: Diagnosis not present

## 2018-07-07 DIAGNOSIS — I5021 Acute systolic (congestive) heart failure: Secondary | ICD-10-CM | POA: Diagnosis not present

## 2018-07-07 DIAGNOSIS — I482 Chronic atrial fibrillation, unspecified: Secondary | ICD-10-CM | POA: Diagnosis not present

## 2018-07-07 DIAGNOSIS — J9621 Acute and chronic respiratory failure with hypoxia: Secondary | ICD-10-CM | POA: Diagnosis not present

## 2018-07-07 LAB — BASIC METABOLIC PANEL
Anion gap: 9 (ref 5–15)
BUN: 15 mg/dL (ref 8–23)
CO2: 21 mmol/L — AB (ref 22–32)
Calcium: 8.5 mg/dL — ABNORMAL LOW (ref 8.9–10.3)
Chloride: 105 mmol/L (ref 98–111)
Creatinine, Ser: 0.84 mg/dL (ref 0.61–1.24)
GFR calc non Af Amer: >60 mL/min (ref >60)
Glucose, Bld: 127 mg/dL — ABNORMAL HIGH (ref 70–99)
Potassium: 3.4 mmol/L — ABNORMAL LOW (ref 3.5–5.1)
Sodium: 135 mmol/L (ref 135–145)

## 2018-07-07 LAB — CBC
HCT: 44 % (ref 39.0–52.0)
Hemoglobin: 14.6 g/dL (ref 13.0–17.0)
MCH: 29.9 pg (ref 26.0–34.0)
MCHC: 33.2 g/dL (ref 30.0–36.0)
MCV: 90 fL (ref 80.0–100.0)
Platelets: 166 10*3/uL (ref 150–400)
RBC: 4.89 MIL/uL (ref 4.22–5.81)
RDW: 15.1 % (ref 11.5–15.5)
WBC: 14.6 10*3/uL — ABNORMAL HIGH (ref 4.0–10.5)
nRBC: 0 % (ref 0.0–0.2)

## 2018-07-07 LAB — HEPARIN LEVEL (UNFRACTIONATED)
HEPARIN UNFRACTIONATED: 0.39 [IU]/mL (ref 0.30–0.70)
Heparin Unfractionated: 0.47 IU/mL (ref 0.30–0.70)
Heparin Unfractionated: 0.3 IU/mL (ref 0.30–0.70)
Heparin Unfractionated: 0.11 IU/mL — ABNORMAL LOW (ref 0.30–0.70)

## 2018-07-07 MED ORDER — PERFLUTREN LIPID MICROSPHERE
1.0000 mL | INTRAVENOUS | Status: AC | PRN
  Administered 2018-07-07: 4 mL via INTRAVENOUS

## 2018-07-07 NOTE — Progress Notes (Addendum)
Pulmonary Critical Care Medicine [place]   PULMONARY CRITICAL CARE SERVICE  PROGRESS NOTE  Date of Service: 07/07/2018  [name] [name]  BMW:[id number]  DOB: 08/10/1955   DOA: 07/02/2018  Referring Physician: Carron Curie, MD  HPI: [name] [name] is a 63 y.o. male seen for follow up of Acute on Chronic Respiratory Failure.  Patient remains intubated with endotracheal tube at this time.  His FiO2 was reduced today to 40% and he appears to be doing well at this time.  Medications: Reviewed on Rounds  Physical Exam:  Vitals: Pulse 85 respirations 18 BP 119/61 O2 sat 98% temp 96.7  Ventilator Settings ventilator mode AC VC rate of 22 tidal volume 440 PEEP of 8 FiO2 40%  . General: Comfortable at this time . Eyes: Grossly normal lids, irises & conjunctiva . ENT: grossly tongue is normal . Neck: no obvious mass . Cardiovascular: S1 S2 normal no gallop . Respiratory: Coarse breath sounds . Abdomen: soft . Skin: no rash seen on limited exam . Musculoskeletal: not rigid . Psychiatric:unable to assess . Neurologic: no seizure no involuntary movements         Lab Data:   Basic Metabolic Panel: Recent Labs  Lab 07/03/18 0402 07/05/18 1050 07/06/18 0221 07/07/18 0225  NA 135 135  --  135  K 3.9 4.4  --  3.4*  CL 99 102  --  105  CO2 28 20*  --  21*  GLUCOSE 155* 154*  --  127*  BUN 30* 19  --  15  CREATININE 0.99 0.76  --  0.84  CALCIUM 7.8* 8.4*  --  8.5*  MG  --  1.7 1.9  --     ABG: Recent Labs  Lab 07/02/18 2227  PHART 7.514*  PCO2ART 33.5  PO2ART 59.8*  HCO3 26.8  O2SAT 91.0    Liver Function Tests: No results for input(s): AST, ALT, ALKPHOS, BILITOT, PROT, ALBUMIN in the last 168 hours. No results for input(s): LIPASE, AMYLASE in the last 168 hours. No results for input(s): AMMONIA in the last 168 hours.  CBC: Recent Labs  Lab 07/02/18 2251 07/05/18 1050 07/06/18 0221 07/07/18 0225  WBC 13.5* 10.2 10.8* 14.6*  NEUTROABS  10.2*  --   --   --   HGB 14.2 16.2 14.6 14.6  HCT 43.4 48.9 43.8 44.0  MCV 90.0 89.7 89.6 90.0  PLT 196 127* 153 166    Cardiac Enzymes: Recent Labs  Lab 07/06/18 0221 07/06/18 0715 07/06/18 1210  CKTOTAL 87 77 63  CKMB 1.9 1.5 2.1  TROPONINI 0.04* 0.04* 0.04*    BNP (last 3 results) No results for input(s): BNP in the last 8760 hours.  ProBNP (last 3 results) No results for input(s): PROBNP in the last 8760 hours.  Radiological Exams: Dg Abd 1 View  Result Date: 07/07/2018 CLINICAL DATA:  Ileus EXAM: ABDOMEN - 1 VIEW COMPARISON:  07/03/2018 FINDINGS: NG tube tip is in the proximal stomach. Diffuse gaseous distention of bowel, most notable in the colon, slightly progressed since prior study. No free air or organomegaly. IMPRESSION: Increasing gaseous distention of bowel, predominantly colon, likely ileus. Electronically Signed   By: Charlett Nose M.D.   On: 07/07/2018 16:02    Assessment/Plan Active Problems:   Acute on chronic respiratory failure with hypoxia (HCC)   COPD, severe (HCC)   Chronic atrial fibrillation   Acute systolic heart failure (HCC)   Lobar pneumonia, unspecified organism (HCC)   1. Acute on chronic respiratory  failure with hypoxia continue with full ventilator support at this time.  Continue to try and titrate oxygen and PEEP as tolerated with the intent of weaning.  His FiO2 has decreased to 40% today and he appears to be doing well at this time.  Continue aggressive pulmonary toilet and secretion management. 2. Severe COPD continue present and supportive care. 3. Chronic atrial fibrillation rate controlled 4. Acute systolic heart failure stable at this time 5. Lobar pneumonia treated continue to monitor   I have personally seen and evaluated the patient, evaluated laboratory and imaging results, formulated the assessment and plan and placed orders. The Patient requires high complexity decision making for assessment and support.  Case was discussed  on Rounds with the Respiratory Therapy Staff  Yevonne Pax, MD [facility] Pulmonary Critical Care Medicine Sleep Medicine

## 2018-07-07 NOTE — Progress Notes (Signed)
  Echocardiogram 2D Echocardiogram has been performed.  Definity contrast was administered by the nurse through the patient's central line.  [name] [name] 07/07/2018, 2:25 PM

## 2018-07-08 ENCOUNTER — Other Ambulatory Visit (HOSPITAL_COMMUNITY): Payer: Medicare Other

## 2018-07-08 DIAGNOSIS — J449 Chronic obstructive pulmonary disease, unspecified: Secondary | ICD-10-CM | POA: Diagnosis not present

## 2018-07-08 DIAGNOSIS — I5021 Acute systolic (congestive) heart failure: Secondary | ICD-10-CM | POA: Diagnosis not present

## 2018-07-08 DIAGNOSIS — J9621 Acute and chronic respiratory failure with hypoxia: Secondary | ICD-10-CM | POA: Diagnosis not present

## 2018-07-08 DIAGNOSIS — I482 Chronic atrial fibrillation, unspecified: Secondary | ICD-10-CM | POA: Diagnosis not present

## 2018-07-08 LAB — BASIC METABOLIC PANEL
Anion gap: 11 (ref 5–15)
BUN: 17 mg/dL (ref 8–23)
CO2: 19 mmol/L — ABNORMAL LOW (ref 22–32)
Calcium: 8.5 mg/dL — ABNORMAL LOW (ref 8.9–10.3)
Chloride: 103 mmol/L (ref 98–111)
Creatinine, Ser: 0.81 mg/dL (ref 0.61–1.24)
GFR calc non Af Amer: >60 mL/min (ref >60)
Glucose, Bld: 130 mg/dL — ABNORMAL HIGH (ref 70–99)
Potassium: 4 mmol/L (ref 3.5–5.1)
Sodium: 133 mmol/L — ABNORMAL LOW (ref 135–145)

## 2018-07-08 LAB — CBC
HCT: 42.7 % (ref 39.0–52.0)
Hemoglobin: 13.5 g/dL (ref 13.0–17.0)
MCH: 28.8 pg (ref 26.0–34.0)
MCHC: 31.6 g/dL (ref 30.0–36.0)
MCV: 91.2 fL (ref 80.0–100.0)
Platelets: 181 10*3/uL (ref 150–400)
RBC: 4.68 MIL/uL (ref 4.22–5.81)
RDW: 15.3 % (ref 11.5–15.5)
WBC: 11.4 10*3/uL — ABNORMAL HIGH (ref 4.0–10.5)
nRBC: 0 % (ref 0.0–0.2)

## 2018-07-08 LAB — PHOSPHORUS: PHOSPHORUS: 4.4 mg/dL (ref 2.5–4.6)

## 2018-07-08 LAB — MAGNESIUM: Magnesium: 2 mg/dL (ref 1.7–2.4)

## 2018-07-08 LAB — HEPARIN LEVEL (UNFRACTIONATED)
Heparin Unfractionated: 0.13 IU/mL — ABNORMAL LOW (ref 0.30–0.70)
Heparin Unfractionated: 0.47 IU/mL (ref 0.30–0.70)
Heparin Unfractionated: 0.35 IU/mL (ref 0.30–0.70)

## 2018-07-08 NOTE — Progress Notes (Signed)
Pulmonary Critical Care Medicine [facility] [place]   PULMONARY CRITICAL CARE SERVICE  PROGRESS NOTE  Date of Service: 07/08/2018  [name] [name]  [id number]:[id number]  DOB: May 14, 1955   DOA: 07/02/2018  Referring Physician: Carron Curie, MD  HPI: [name] [name] is a 63 y.o. male seen for follow up of Acute on Chronic Respiratory Failure.  Patient currently is on pressure support mode with a 8-hour goal  Medications: Reviewed on Rounds  Physical Exam:  Vitals: Temperature 97.3 pulse 103 respiratory 24 blood pressure 99/63 saturations 95%  Ventilator Settings mode ventilation pressure support FiO2 40% pressure 12 PEEP 8  . General: Comfortable at this time . Eyes: Grossly normal lids, irises & conjunctiva . ENT: grossly tongue is normal . Neck: no obvious mass . Cardiovascular: S1 S2 normal no gallop . Respiratory: No rhonchi or rales are noted at this time . Abdomen: soft . Skin: no rash seen on limited exam . Musculoskeletal: not rigid . Psychiatric:unable to assess . Neurologic: no seizure no involuntary movements         Lab Data:   Basic Metabolic Panel: Recent Labs  Lab 07/03/18 0402 07/05/18 1050 07/06/18 0221 07/07/18 0225 07/08/18 0503  NA 135 135  --  135 133*  K 3.9 4.4  --  3.4* 4.0  CL 99 102  --  105 103  CO2 28 20*  --  21* 19*  GLUCOSE 155* 154*  --  127* 130*  BUN 30* 19  --  15 17  CREATININE 0.99 0.76  --  0.84 0.81  CALCIUM 7.8* 8.4*  --  8.5* 8.5*  MG  --  1.7 1.9  --  2.0  PHOS  --   --   --   --  4.4    ABG: Recent Labs  Lab 07/02/18 2227  PHART 7.514*  PCO2ART 33.5  PO2ART 59.8*  HCO3 26.8  O2SAT 91.0    Liver Function Tests: No results for input(s): AST, ALT, ALKPHOS, BILITOT, PROT, ALBUMIN in the last 168 hours. No results for input(s): LIPASE, AMYLASE in the last 168 hours. No results for input(s): AMMONIA in the last 168 hours.  CBC: Recent Labs  Lab 07/02/18 2251 07/05/18 1050 07/06/18 0221 07/07/18 0225  07/08/18 0503  WBC 13.5* 10.2 10.8* 14.6* 11.4*  NEUTROABS 10.2*  --   --   --   --   HGB 14.2 16.2 14.6 14.6 13.5  HCT 43.4 48.9 43.8 44.0 42.7  MCV 90.0 89.7 89.6 90.0 91.2  PLT 196 127* 153 166 181    Cardiac Enzymes: Recent Labs  Lab 07/06/18 0221 07/06/18 0715 07/06/18 1210  CKTOTAL 87 77 63  CKMB 1.9 1.5 2.1  TROPONINI 0.04* 0.04* 0.04*    BNP (last 3 results) No results for input(s): BNP in the last 8760 hours.  ProBNP (last 3 results) No results for input(s): PROBNP in the last 8760 hours.  Radiological Exams: Dg Abd 1 View  Result Date: 07/08/2018 CLINICAL DATA:  Orogastric tube placement and advancement. EXAM: ABDOMEN - 1 VIEW COMPARISON:  Yesterday. FINDINGS: Orogastric tube tip in the distal stomach and side hole in the mid to distal stomach. The included bowel gas pattern is normal. Dense left lower lobe airspace opacity and a shin oil milder patchy airspace opacity at both lung bases with enlargement of the cardiac silhouette and prominence of the pulmonary vasculature and interstitial markings. A central venous catheter is demonstrated with its tip in the superior vena cava. IMPRESSION: 1. Orogastric tube  tip in the distal stomach and side hole in the mid to distal stomach. 2. Dense left lower lobe atelectasis or pneumonia. 3. Cardiomegaly and changes of congestive heart failure. Electronically Signed   By: Beckie Salts M.D.   On: 07/08/2018 13:22   Dg Abd 1 View  Result Date: 07/07/2018 CLINICAL DATA:  Ileus EXAM: ABDOMEN - 1 VIEW COMPARISON:  07/03/2018 FINDINGS: NG tube tip is in the proximal stomach. Diffuse gaseous distention of bowel, most notable in the colon, slightly progressed since prior study. No free air or organomegaly. IMPRESSION: Increasing gaseous distention of bowel, predominantly colon, likely ileus. Electronically Signed   By: Charlett Nose M.D.   On: 07/07/2018 16:02    Assessment/Plan Active Problems:   Acute on chronic respiratory failure with  hypoxia (HCC)   COPD, severe (HCC)   Chronic atrial fibrillation   Acute systolic heart failure (HCC)   Lobar pneumonia, unspecified organism (HCC)   1. Acute on chronic respiratory failure with hypoxia we will continue with weaning on the pressure support as mentioned the goal is for 8 hours.  Continue secretion management pulmonary toilet. 2. Severe COPD we will continue present management nebulizers as necessary 3. Chronic atrial fibrillation rate is controlled 4. Acute systolic heart failure right now is compensated 5. Lobar pneumonia treated last x-ray was reviewed as above   I have personally seen and evaluated the patient, evaluated laboratory and imaging results, formulated the assessment and plan and placed orders. The Patient requires high complexity decision making for assessment and support.  Case was discussed on Rounds with the Respiratory Therapy Staff  Yevonne Pax, MD [facility] Pulmonary Critical Care Medicine Sleep Medicine

## 2018-07-09 DIAGNOSIS — J449 Chronic obstructive pulmonary disease, unspecified: Secondary | ICD-10-CM | POA: Diagnosis not present

## 2018-07-09 DIAGNOSIS — I482 Chronic atrial fibrillation, unspecified: Secondary | ICD-10-CM | POA: Diagnosis not present

## 2018-07-09 DIAGNOSIS — J9621 Acute and chronic respiratory failure with hypoxia: Secondary | ICD-10-CM | POA: Diagnosis not present

## 2018-07-09 DIAGNOSIS — I5021 Acute systolic (congestive) heart failure: Secondary | ICD-10-CM | POA: Diagnosis not present

## 2018-07-09 LAB — CBC
HEMATOCRIT: 43.8 % (ref 39.0–52.0)
Hemoglobin: 13.9 g/dL (ref 13.0–17.0)
MCH: 29 pg (ref 26.0–34.0)
MCHC: 31.7 g/dL (ref 30.0–36.0)
MCV: 91.3 fL (ref 80.0–100.0)
Platelets: 217 10*3/uL (ref 150–400)
RBC: 4.8 MIL/uL (ref 4.22–5.81)
RDW: 15.3 % (ref 11.5–15.5)
WBC: 11.1 10*3/uL — ABNORMAL HIGH (ref 4.0–10.5)
nRBC: 0 % (ref 0.0–0.2)

## 2018-07-09 LAB — BASIC METABOLIC PANEL
Anion gap: 9 (ref 5–15)
BUN: 16 mg/dL (ref 8–23)
CO2: 21 mmol/L — AB (ref 22–32)
Calcium: 8.7 mg/dL — ABNORMAL LOW (ref 8.9–10.3)
Chloride: 103 mmol/L (ref 98–111)
Creatinine, Ser: 0.81 mg/dL (ref 0.61–1.24)
GFR calc Af Amer: >60 mL/min (ref >60)
GFR calc non Af Amer: >60 mL/min (ref >60)
Glucose, Bld: 136 mg/dL — ABNORMAL HIGH (ref 70–99)
Potassium: 3.4 mmol/L — ABNORMAL LOW (ref 3.5–5.1)
Sodium: 133 mmol/L — ABNORMAL LOW (ref 135–145)

## 2018-07-09 LAB — HEPARIN LEVEL (UNFRACTIONATED)
Heparin Unfractionated: 0.26 IU/mL — ABNORMAL LOW (ref 0.30–0.70)
Heparin Unfractionated: 0.19 IU/mL — ABNORMAL LOW (ref 0.30–0.70)
Heparin Unfractionated: 0.54 IU/mL (ref 0.30–0.70)

## 2018-07-09 NOTE — Progress Notes (Signed)
Pulmonary Critical Care Medicine [facility] [place]   PULMONARY CRITICAL CARE SERVICE  PROGRESS NOTE  Date of Service: 07/09/2018  [name] [name]  PHX:[id number]  DOB: [birth year]   DOA: 07/02/2018  Referring Physician: Carron Curie, MD  HPI: [name] [name] is a 63 y.o. male seen for follow up of Acute on Chronic Respiratory Failure.  Patient is on full support right now on assist control mode currently on 35% FiO2 good volumes are noted however patient did not tolerate attempt on pressure support today  Medications: Reviewed on Rounds  Physical Exam:  Vitals: Temperature 97.8 pulse 89 respiratory rate 16 blood pressure 100/66 saturations 97%  Ventilator Settings mode of ventilation assist control FiO2 35% tidal volume 546 PEEP 5  . General: Comfortable at this time . Eyes: Grossly normal lids, irises & conjunctiva . ENT: grossly tongue is normal . Neck: no obvious mass . Cardiovascular: S1 S2 normal no gallop . Respiratory: No rhonchi or rales are noted at this time . Abdomen: soft . Skin: no rash seen on limited exam . Musculoskeletal: not rigid . Psychiatric:unable to assess . Neurologic: no seizure no involuntary movements         Lab Data:   Basic Metabolic Panel: Recent Labs  Lab 07/03/18 0402 07/05/18 1050 07/06/18 0221 07/07/18 0225 07/08/18 0503 07/09/18 0520  NA 135 135  --  135 133* 133*  K 3.9 4.4  --  3.4* 4.0 3.4*  CL 99 102  --  105 103 103  CO2 28 20*  --  21* 19* 21*  GLUCOSE 155* 154*  --  127* 130* 136*  BUN 30* 19  --  15 17 16   CREATININE 0.99 0.76  --  0.84 0.81 0.81  CALCIUM 7.8* 8.4*  --  8.5* 8.5* 8.7*  MG  --  1.7 1.9  --  2.0  --   PHOS  --   --   --   --  4.4  --     ABG: Recent Labs  Lab 07/02/18 2227  PHART 7.514*  PCO2ART 33.5  PO2ART 59.8*  HCO3 26.8  O2SAT 91.0    Liver Function Tests: No results for input(s): AST, ALT, ALKPHOS, BILITOT, PROT, ALBUMIN in the last 168 hours. No results for input(s):  LIPASE, AMYLASE in the last 168 hours. No results for input(s): AMMONIA in the last 168 hours.  CBC: Recent Labs  Lab 07/02/18 2251 07/05/18 1050 07/06/18 0221 07/07/18 0225 07/08/18 0503 07/09/18 0520  WBC 13.5* 10.2 10.8* 14.6* 11.4* 11.1*  NEUTROABS 10.2*  --   --   --   --   --   HGB 14.2 16.2 14.6 14.6 13.5 13.9  HCT 43.4 48.9 43.8 44.0 42.7 43.8  MCV 90.0 89.7 89.6 90.0 91.2 91.3  PLT 196 127* 153 166 181 217    Cardiac Enzymes: Recent Labs  Lab 07/06/18 0221 07/06/18 0715 07/06/18 1210  CKTOTAL 87 77 63  CKMB 1.9 1.5 2.1  TROPONINI 0.04* 0.04* 0.04*    BNP (last 3 results) No results for input(s): BNP in the last 8760 hours.  ProBNP (last 3 results) No results for input(s): PROBNP in the last 8760 hours.  Radiological Exams: Dg Abd 1 View  Result Date: 07/08/2018 CLINICAL DATA:  Orogastric tube placement and advancement. EXAM: ABDOMEN - 1 VIEW COMPARISON:  Yesterday. FINDINGS: Orogastric tube tip in the distal stomach and side hole in the mid to distal stomach. The included bowel gas pattern is normal. Dense left lower lobe  airspace opacity and a shin oil milder patchy airspace opacity at both lung bases with enlargement of the cardiac silhouette and prominence of the pulmonary vasculature and interstitial markings. A central venous catheter is demonstrated with its tip in the superior vena cava. IMPRESSION: 1. Orogastric tube tip in the distal stomach and side hole in the mid to distal stomach. 2. Dense left lower lobe atelectasis or pneumonia. 3. Cardiomegaly and changes of congestive heart failure. Electronically Signed   By: Beckie Salts M.D.   On: 07/08/2018 13:22   Dg Abd 1 View  Result Date: 07/07/2018 CLINICAL DATA:  Ileus EXAM: ABDOMEN - 1 VIEW COMPARISON:  07/03/2018 FINDINGS: NG tube tip is in the proximal stomach. Diffuse gaseous distention of bowel, most notable in the colon, slightly progressed since prior study. No free air or organomegaly. IMPRESSION:  Increasing gaseous distention of bowel, predominantly colon, likely ileus. Electronically Signed   By: Charlett Nose M.D.   On: 07/07/2018 16:02    Assessment/Plan Active Problems:   Acute on chronic respiratory failure with hypoxia (HCC)   COPD, severe (HCC)   Chronic atrial fibrillation   Acute systolic heart failure (HCC)   Lobar pneumonia, unspecified organism (HCC)   1. Acute on chronic respiratory failure with hypoxia we will continue with the full support on the ventilator recheck the RSB I 2. Severe COPD at baseline we will continue with present management 3. Chronic atrial fibrillation rate is controlled at this time. 4. Acute systolic heart failure diuretics as tolerated 5. Lobar pneumonia treated we will continue to follow radiologically   I have personally seen and evaluated the patient, evaluated laboratory and imaging results, formulated the assessment and plan and placed orders. The Patient requires high complexity decision making for assessment and support.  Case was discussed on Rounds with the Respiratory Therapy Staff  Yevonne Pax, MD [facility] Pulmonary Critical Care Medicine Sleep Medicine

## 2018-07-10 DIAGNOSIS — I5021 Acute systolic (congestive) heart failure: Secondary | ICD-10-CM | POA: Diagnosis not present

## 2018-07-10 DIAGNOSIS — J9621 Acute and chronic respiratory failure with hypoxia: Secondary | ICD-10-CM | POA: Diagnosis not present

## 2018-07-10 DIAGNOSIS — I482 Chronic atrial fibrillation, unspecified: Secondary | ICD-10-CM | POA: Diagnosis not present

## 2018-07-10 DIAGNOSIS — J449 Chronic obstructive pulmonary disease, unspecified: Secondary | ICD-10-CM | POA: Diagnosis not present

## 2018-07-10 LAB — BASIC METABOLIC PANEL
Anion gap: 9 (ref 5–15)
BUN: 17 mg/dL (ref 8–23)
CO2: 21 mmol/L — ABNORMAL LOW (ref 22–32)
Calcium: 8.3 mg/dL — ABNORMAL LOW (ref 8.9–10.3)
Chloride: 102 mmol/L (ref 98–111)
Creatinine, Ser: 0.89 mg/dL (ref 0.61–1.24)
GFR calc Af Amer: >60 mL/min (ref >60)
Glucose, Bld: 138 mg/dL — ABNORMAL HIGH (ref 70–99)
Potassium: 3.5 mmol/L (ref 3.5–5.1)
Sodium: 132 mmol/L — ABNORMAL LOW (ref 135–145)

## 2018-07-10 LAB — HEPARIN LEVEL (UNFRACTIONATED)
HEPARIN UNFRACTIONATED: 0.93 [IU]/mL — AB (ref 0.30–0.70)
Heparin Unfractionated: 0.57 IU/mL (ref 0.30–0.70)
Heparin Unfractionated: 0.25 IU/mL — ABNORMAL LOW (ref 0.30–0.70)

## 2018-07-10 LAB — MAGNESIUM: Magnesium: 2.1 mg/dL (ref 1.7–2.4)

## 2018-07-10 LAB — TRIGLYCERIDES: Triglycerides: 73 mg/dL (ref <150)

## 2018-07-10 NOTE — Progress Notes (Addendum)
Pulmonary Critical Care Medicine [facility] [place]   PULMONARY CRITICAL CARE SERVICE  PROGRESS NOTE  Date of Service: 07/10/2018  Westlee Wayment  FHQ:[id number]  DOB: Jun 14, 1955   DOA: 07/02/2018  Referring Physician: Carron Curie, MD  HPI: Zyrion Nikolic is a 63 y.o. male seen for follow up of Acute on Chronic Respiratory Failure.  Patient remains intubated with endotracheal tube.  He has an ENT consult ordered.  He is doing fairly well at this time.  Medications: Reviewed on Rounds  Physical Exam:  Vitals: Pulse 130 respirations 24 BP 100/58 O2 sat 98% temp 98.4  Ventilator Settings ventilator mode AC VC rate of 25 tidal volume 450 PEEP of 5 FiO2 30%  . General: Comfortable at this time . Eyes: Grossly normal lids, irises & conjunctiva . ENT: grossly tongue is normal . Neck: no obvious mass . Cardiovascular: S1 S2 normal no gallop . Respiratory: No rales or rhonchi noted . Abdomen: soft . Skin: no rash seen on limited exam . Musculoskeletal: not rigid . Psychiatric:unable to assess . Neurologic: no seizure no involuntary movements         Lab Data:   Basic Metabolic Panel: Recent Labs  Lab 07/05/18 1050 07/06/18 0221 07/07/18 0225 07/08/18 0503 07/09/18 0520 07/10/18 0224  NA 135  --  135 133* 133* 132*  K 4.4  --  3.4* 4.0 3.4* 3.5  CL 102  --  105 103 103 102  CO2 20*  --  21* 19* 21* 21*  GLUCOSE 154*  --  127* 130* 136* 138*  BUN 19  --  15 17 16 17   CREATININE 0.76  --  0.84 0.81 0.81 0.89  CALCIUM 8.4*  --  8.5* 8.5* 8.7* 8.3*  MG 1.7 1.9  --  2.0  --  2.1  PHOS  --   --   --  4.4  --   --     ABG: No results for input(s): PHART, PCO2ART, PO2ART, HCO3, O2SAT in the last 168 hours.  Liver Function Tests: No results for input(s): AST, ALT, ALKPHOS, BILITOT, PROT, ALBUMIN in the last 168 hours. No results for input(s): LIPASE, AMYLASE in the last 168 hours. No results for input(s): AMMONIA in the last 168 hours.  CBC: Recent Labs   Lab 07/05/18 1050 07/06/18 0221 07/07/18 0225 07/08/18 0503 07/09/18 0520  WBC 10.2 10.8* 14.6* 11.4* 11.1*  HGB 16.2 14.6 14.6 13.5 13.9  HCT 48.9 43.8 44.0 42.7 43.8  MCV 89.7 89.6 90.0 91.2 91.3  PLT 127* 153 166 181 217    Cardiac Enzymes: Recent Labs  Lab 07/06/18 0221 07/06/18 0715 07/06/18 1210  CKTOTAL 87 77 63  CKMB 1.9 1.5 2.1  TROPONINI 0.04* 0.04* 0.04*    BNP (last 3 results) No results for input(s): BNP in the last 8760 hours.  ProBNP (last 3 results) No results for input(s): PROBNP in the last 8760 hours.  Radiological Exams: No results found.  Assessment/Plan Active Problems:   Acute on chronic respiratory failure with hypoxia (HCC)   COPD, severe (HCC)   Chronic atrial fibrillation   Acute systolic heart failure (HCC)   Lobar pneumonia, unspecified organism (HCC)   1. Acute on chronic respiratory failure with hypoxia continue on full support with ventilator at this time.  Patient will have ENT consult for trach placement. 2. Severe COPD at baseline we will continue with present management 3. Chronic atrial fibrillation rate controlled 4. Acute systolic heart failure diuretics as tolerated 5. Lobar pneumonia  treated continue to follow   I have personally seen and evaluated the patient, evaluated laboratory and imaging results, formulated the assessment and plan and placed orders. The Patient requires high complexity decision making for assessment and support.  Case was discussed on Rounds with the Respiratory Therapy Staff  Yevonne Pax, MD [facility] Pulmonary Critical Care Medicine Sleep Medicine

## 2018-07-11 DIAGNOSIS — J9621 Acute and chronic respiratory failure with hypoxia: Secondary | ICD-10-CM | POA: Diagnosis not present

## 2018-07-11 DIAGNOSIS — I5021 Acute systolic (congestive) heart failure: Secondary | ICD-10-CM | POA: Diagnosis not present

## 2018-07-11 DIAGNOSIS — J449 Chronic obstructive pulmonary disease, unspecified: Secondary | ICD-10-CM | POA: Diagnosis not present

## 2018-07-11 DIAGNOSIS — I482 Chronic atrial fibrillation, unspecified: Secondary | ICD-10-CM | POA: Diagnosis not present

## 2018-07-11 LAB — RENAL FUNCTION PANEL
Albumin: 2.8 g/dL — ABNORMAL LOW (ref 3.5–5.0)
Anion gap: 10 (ref 5–15)
BUN: 16 mg/dL (ref 8–23)
CO2: 21 mmol/L — ABNORMAL LOW (ref 22–32)
Calcium: 8.7 mg/dL — ABNORMAL LOW (ref 8.9–10.3)
Chloride: 105 mmol/L (ref 98–111)
Creatinine, Ser: 0.87 mg/dL (ref 0.61–1.24)
GFR calc Af Amer: >60 mL/min (ref >60)
GFR calc non Af Amer: >60 mL/min (ref >60)
Glucose, Bld: 116 mg/dL — ABNORMAL HIGH (ref 70–99)
PHOSPHORUS: 4.3 mg/dL (ref 2.5–4.6)
Potassium: 3.6 mmol/L (ref 3.5–5.1)
Sodium: 136 mmol/L (ref 135–145)

## 2018-07-11 LAB — MAGNESIUM: Magnesium: 2.2 mg/dL (ref 1.7–2.4)

## 2018-07-11 LAB — HEPARIN LEVEL (UNFRACTIONATED)
Heparin Unfractionated: <0.1 IU/mL — ABNORMAL LOW (ref 0.30–0.70)
Heparin Unfractionated: 0.32 IU/mL (ref 0.30–0.70)

## 2018-07-11 LAB — CBC
HCT: 44.4 % (ref 39.0–52.0)
Hemoglobin: 13.7 g/dL (ref 13.0–17.0)
MCH: 28.7 pg (ref 26.0–34.0)
MCHC: 30.9 g/dL (ref 30.0–36.0)
MCV: 92.9 fL (ref 80.0–100.0)
NRBC: 0 % (ref 0.0–0.2)
Platelets: 232 10*3/uL (ref 150–400)
RBC: 4.78 MIL/uL (ref 4.22–5.81)
RDW: 15.6 % — ABNORMAL HIGH (ref 11.5–15.5)
WBC: 10.3 10*3/uL (ref 4.0–10.5)

## 2018-07-11 NOTE — Progress Notes (Signed)
Pulmonary Critical Care Medicine [facility] [place]   PULMONARY CRITICAL CARE SERVICE  PROGRESS NOTE  Date of Service: 07/11/2018  [name] [name]  [id number]:[id number]  DOB: November 30, 1955   DOA: 07/02/2018  Referring Physician: Carron Curie, MD  HPI: [name] [name] is a 63 y.o. male seen for follow up of Acute on Chronic Respiratory Failure.  Patient is on assist control mode right now comfortable without distress and has been on 35% FiO2 remains endotracheally intubated at this time.  ENT consultation was ordered for tracheostomy.  He had a bit of cuff leak noted discussed with the RT on rounds they will assess the cuff pressures  Medications: Reviewed on Rounds  Physical Exam:  Vitals: Temperature 97.0 pulse 107 respiratory 18 blood pressure 112/66 saturations 97%  Ventilator Settings mode ventilation assist control FiO2 35% tidal volume 362 PEEP 5  . General: Comfortable at this time . Eyes: Grossly normal lids, irises & conjunctiva . ENT: grossly tongue is normal . Neck: no obvious mass . Cardiovascular: S1 S2 normal no gallop . Respiratory: No rhonchi or rales are noted at this time . Abdomen: soft . Skin: no rash seen on limited exam . Musculoskeletal: not rigid . Psychiatric:unable to assess . Neurologic: no seizure no involuntary movements         Lab Data:   Basic Metabolic Panel: Recent Labs  Lab 07/05/18 1050 07/06/18 0221 07/07/18 0225 07/08/18 0503 07/09/18 0520 07/10/18 0224 07/11/18 0526  NA 135  --  135 133* 133* 132* 136  K 4.4  --  3.4* 4.0 3.4* 3.5 3.6  CL 102  --  105 103 103 102 105  CO2 20*  --  21* 19* 21* 21* 21*  GLUCOSE 154*  --  127* 130* 136* 138* 116*  BUN 19  --  15 17 16 17 16   CREATININE 0.76  --  0.84 0.81 0.81 0.89 0.87  CALCIUM 8.4*  --  8.5* 8.5* 8.7* 8.3* 8.7*  MG 1.7 1.9  --  2.0  --  2.1 2.2  PHOS  --   --   --  4.4  --   --  4.3    ABG: No results for input(s): PHART, PCO2ART, PO2ART, HCO3, O2SAT in the last 168  hours.  Liver Function Tests: Recent Labs  Lab 07/11/18 0526  ALBUMIN 2.8*   No results for input(s): LIPASE, AMYLASE in the last 168 hours. No results for input(s): AMMONIA in the last 168 hours.  CBC: Recent Labs  Lab 07/06/18 0221 07/07/18 0225 07/08/18 0503 07/09/18 0520 07/11/18 0526  WBC 10.8* 14.6* 11.4* 11.1* 10.3  HGB 14.6 14.6 13.5 13.9 13.7  HCT 43.8 44.0 42.7 43.8 44.4  MCV 89.6 90.0 91.2 91.3 92.9  PLT 153 166 181 217 232    Cardiac Enzymes: Recent Labs  Lab 07/06/18 0221 07/06/18 0715 07/06/18 1210  CKTOTAL 87 77 63  CKMB 1.9 1.5 2.1  TROPONINI 0.04* 0.04* 0.04*    BNP (last 3 results) No results for input(s): BNP in the last 8760 hours.  ProBNP (last 3 results) No results for input(s): PROBNP in the last 8760 hours.  Radiological Exams: No results found.  Assessment/Plan Active Problems:   Acute on chronic respiratory failure with hypoxia (HCC)   COPD, severe (HCC)   Chronic atrial fibrillation   Acute systolic heart failure (HCC)   Lobar pneumonia, unspecified organism (HCC)   1. Acute on chronic respiratory failure with hypoxia we will continue with full vent support patient has  high risk airway orally intubated.  Awaiting ENT evaluation for possibility of tracheostomy. 2. Severe COPD requiring multiple intubations.  Patient is going to have a tracheostomy done to facilitate the weaning process. 3. Chronic atrial fibrillation rate is controlled. 4. Acute systolic heart failure unchanged 5. Lobar pneumonia treated we will continue with present management   I have personally seen and evaluated the patient, evaluated laboratory and imaging results, formulated the assessment and plan and placed orders. The Patient requires high complexity decision making for assessment and support.  Case was discussed on Rounds with the Respiratory Therapy Staff  Yevonne Pax, MD [facility] Pulmonary Critical Care Medicine Sleep Medicine

## 2018-07-12 ENCOUNTER — Other Ambulatory Visit (HOSPITAL_COMMUNITY): Payer: Medicare Other

## 2018-07-12 DIAGNOSIS — I482 Chronic atrial fibrillation, unspecified: Secondary | ICD-10-CM | POA: Diagnosis not present

## 2018-07-12 DIAGNOSIS — J9621 Acute and chronic respiratory failure with hypoxia: Secondary | ICD-10-CM | POA: Diagnosis not present

## 2018-07-12 DIAGNOSIS — I5021 Acute systolic (congestive) heart failure: Secondary | ICD-10-CM | POA: Diagnosis not present

## 2018-07-12 DIAGNOSIS — J449 Chronic obstructive pulmonary disease, unspecified: Secondary | ICD-10-CM | POA: Diagnosis not present

## 2018-07-12 LAB — CBC
HCT: 42.1 % (ref 39.0–52.0)
Hemoglobin: 13.2 g/dL (ref 13.0–17.0)
MCH: 28.9 pg (ref 26.0–34.0)
MCHC: 31.4 g/dL (ref 30.0–36.0)
MCV: 92.1 fL (ref 80.0–100.0)
Platelets: 249 10*3/uL (ref 150–400)
RBC: 4.57 MIL/uL (ref 4.22–5.81)
RDW: 15.6 % — ABNORMAL HIGH (ref 11.5–15.5)
WBC: 10.8 10*3/uL — ABNORMAL HIGH (ref 4.0–10.5)
nRBC: 0 % (ref 0.0–0.2)

## 2018-07-12 LAB — BASIC METABOLIC PANEL
Anion gap: 12 (ref 5–15)
BUN: 17 mg/dL (ref 8–23)
CALCIUM: 8.7 mg/dL — AB (ref 8.9–10.3)
CO2: 21 mmol/L — ABNORMAL LOW (ref 22–32)
Chloride: 103 mmol/L (ref 98–111)
Creatinine, Ser: 0.85 mg/dL (ref 0.61–1.24)
GFR calc Af Amer: >60 mL/min (ref >60)
GFR calc non Af Amer: >60 mL/min (ref >60)
Glucose, Bld: 132 mg/dL — ABNORMAL HIGH (ref 70–99)
Potassium: 3.7 mmol/L (ref 3.5–5.1)
SODIUM: 136 mmol/L (ref 135–145)

## 2018-07-12 LAB — MAGNESIUM: Magnesium: 2.2 mg/dL (ref 1.7–2.4)

## 2018-07-12 LAB — PHOSPHORUS: Phosphorus: 5 mg/dL — ABNORMAL HIGH (ref 2.5–4.6)

## 2018-07-12 NOTE — Progress Notes (Addendum)
Pulmonary Critical Care Medicine [facility] [place]   PULMONARY CRITICAL CARE SERVICE  PROGRESS NOTE  Date of Service: 07/12/2018  [name] [name]  FMB:[id number]  DOB: 07-29-1955   DOA: 07/02/2018  Referring Physician: Carron Curie, MD  HPI: [name] [name] is a 63 y.o. male seen for follow up of Acute on Chronic Respiratory Failure.  Patient remains on ventilator full support with his endotracheal tube in place.  He is scheduled for ENT evaluation this week.  Respiratory therapy reports white frothy secretions and a chest x-ray was obtained today.  The chest x-ray showed an increase in CHF with layering bilateral pleural effusions.  Patient is on Lasix.  Patient continues to be in A. fib with RVR rates in the 120-150's.    Medications: Reviewed on Rounds  Physical Exam:  Vitals: Pulse 146 respirations 18 BP 87/59 O2 98% temp 98.1  Ventilator Settings ventilator mode AC VC rate of 25 tidal volume 440 PEEP of 5 FiO2 30%.  . General: Comfortable at this time . Eyes: Grossly normal lids, irises & conjunctiva . ENT: grossly tongue is normal . Neck: no obvious mass . Cardiovascular: S1 S2 normal no gallop . Respiratory: No rales or rhonchi noted . Abdomen: soft . Skin: no rash seen on limited exam . Musculoskeletal: not rigid . Psychiatric:unable to assess . Neurologic: no seizure no involuntary movements         Lab Data:   Basic Metabolic Panel: Recent Labs  Lab 07/06/18 0221  07/08/18 0503 07/09/18 0520 07/10/18 0224 07/11/18 0526 07/12/18 0513  NA  --    < > 133* 133* 132* 136 136  K  --    < > 4.0 3.4* 3.5 3.6 3.7  CL  --    < > 103 103 102 105 103  CO2  --    < > 19* 21* 21* 21* 21*  GLUCOSE  --    < > 130* 136* 138* 116* 132*  BUN  --    < > 17 16 17 16 17   CREATININE  --    < > 0.81 0.81 0.89 0.87 0.85  CALCIUM  --    < > 8.5* 8.7* 8.3* 8.7* 8.7*  MG 1.9  --  2.0  --  2.1 2.2 2.2  PHOS  --   --  4.4  --   --  4.3 5.0*   < > = values in this  interval not displayed.    ABG: No results for input(s): PHART, PCO2ART, PO2ART, HCO3, O2SAT in the last 168 hours.  Liver Function Tests: Recent Labs  Lab 07/11/18 0526  ALBUMIN 2.8*   No results for input(s): LIPASE, AMYLASE in the last 168 hours. No results for input(s): AMMONIA in the last 168 hours.  CBC: Recent Labs  Lab 07/07/18 0225 07/08/18 0503 07/09/18 0520 07/11/18 0526 07/12/18 0513  WBC 14.6* 11.4* 11.1* 10.3 10.8*  HGB 14.6 13.5 13.9 13.7 13.2  HCT 44.0 42.7 43.8 44.4 42.1  MCV 90.0 91.2 91.3 92.9 92.1  PLT 166 181 217 232 249    Cardiac Enzymes: Recent Labs  Lab 07/06/18 0221 07/06/18 0715 07/06/18 1210  CKTOTAL 87 77 63  CKMB 1.9 1.5 2.1  TROPONINI 0.04* 0.04* 0.04*    BNP (last 3 results) No results for input(s): BNP in the last 8760 hours.  ProBNP (last 3 results) No results for input(s): PROBNP in the last 8760 hours.  Radiological Exams: Dg Chest Port 1 View  Result Date: 07/12/2018 CLINICAL  DATA:  Follow-up of pleural effusion. Hypertension. Schizophrenia. EXAM: PORTABLE CHEST 1 VIEW COMPARISON:  07/05/2018 FINDINGS: Endotracheal tube terminates 3.0 Cm above carina. A left internal jugular line tip at mid SVC. Nasogastric tube extends beyond the inferior aspect of the film. Normal heart size for level of inspiration. Small bilateral pleural effusions are new or increased. No pneumothorax. Low lung volumes. Worsened aeration with increase in mild interstitial edema and bibasilar airspace disease. IMPRESSION: Slightly worsened aeration with congestive heart failure, increased bibasilar airspace disease, and layering bilateral pleural effusions. Aortic Atherosclerosis (ICD10-I70.0). Electronically Signed   By: Jeronimo Greaves M.D.   On: 07/12/2018 15:34    Assessment/Plan Active Problems:   Acute on chronic respiratory failure with hypoxia (HCC)   COPD, severe (HCC)   Chronic atrial fibrillation   Acute systolic heart failure (HCC)   Lobar  pneumonia, unspecified organism (HCC)   1. Acute on chronic respiratory failure with hypoxia continue full vent support.  ENT disease at this week. 2. Severe COPD required 2 intubations.  Plan is to get tracheostomy to assist with weaning patient. 3. Chronic atrial fibrillation rate controlled 4. Acute systolic heart failure unchanged 5. Lobar pneumonia treated continue present management   I have personally seen and evaluated the patient, evaluated laboratory and imaging results, formulated the assessment and plan and placed orders. The Patient requires high complexity decision making for assessment and support.  Case was discussed on Rounds with the Respiratory Therapy Staff  Yevonne Pax, MD [facility] Pulmonary Critical Care Medicine Sleep Medicine

## 2018-07-13 ENCOUNTER — Other Ambulatory Visit (HOSPITAL_COMMUNITY): Payer: Medicare Other

## 2018-07-13 DIAGNOSIS — J9621 Acute and chronic respiratory failure with hypoxia: Secondary | ICD-10-CM | POA: Diagnosis not present

## 2018-07-13 DIAGNOSIS — J449 Chronic obstructive pulmonary disease, unspecified: Secondary | ICD-10-CM | POA: Diagnosis not present

## 2018-07-13 DIAGNOSIS — I482 Chronic atrial fibrillation, unspecified: Secondary | ICD-10-CM | POA: Diagnosis not present

## 2018-07-13 DIAGNOSIS — I5021 Acute systolic (congestive) heart failure: Secondary | ICD-10-CM | POA: Diagnosis not present

## 2018-07-13 LAB — BLOOD GAS, ARTERIAL
Acid-base deficit: 2.7 mmol/L — ABNORMAL HIGH (ref 0.0–2.0)
Bicarbonate: 20.9 mmol/L (ref 20.0–28.0)
FIO2: 35
MECHVT: 440 mL
O2 Saturation: 97.2 %
PEEP: 5 cmH2O
Patient temperature: 98.6
RATE: 25 resp/min
pCO2 arterial: 32.2 mmHg (ref 32.0–48.0)
pH, Arterial: 7.429 (ref 7.350–7.450)
pO2, Arterial: 93.3 mmHg (ref 83.0–108.0)

## 2018-07-13 LAB — RENAL FUNCTION PANEL
Albumin: 3.4 g/dL — ABNORMAL LOW (ref 3.5–5.0)
Anion gap: 12 (ref 5–15)
BUN: 22 mg/dL (ref 8–23)
CO2: 21 mmol/L — ABNORMAL LOW (ref 22–32)
Calcium: 9.3 mg/dL (ref 8.9–10.3)
Chloride: 100 mmol/L (ref 98–111)
Creatinine, Ser: 1.2 mg/dL (ref 0.61–1.24)
GFR calc Af Amer: >60 mL/min (ref >60)
GFR calc non Af Amer: >60 mL/min (ref >60)
GLUCOSE: 217 mg/dL — AB (ref 70–99)
PHOSPHORUS: 4.8 mg/dL — AB (ref 2.5–4.6)
Potassium: 4.6 mmol/L (ref 3.5–5.1)
Sodium: 133 mmol/L — ABNORMAL LOW (ref 135–145)

## 2018-07-13 LAB — CBC
HCT: 47.3 % (ref 39.0–52.0)
Hemoglobin: 15.1 g/dL (ref 13.0–17.0)
MCH: 29.3 pg (ref 26.0–34.0)
MCHC: 31.9 g/dL (ref 30.0–36.0)
MCV: 91.8 fL (ref 80.0–100.0)
Platelets: 373 10*3/uL (ref 150–400)
RBC: 5.15 MIL/uL (ref 4.22–5.81)
RDW: 15.6 % — ABNORMAL HIGH (ref 11.5–15.5)
WBC: 18.5 10*3/uL — AB (ref 4.0–10.5)
nRBC: 0 % (ref 0.0–0.2)

## 2018-07-13 LAB — D-DIMER, QUANTITATIVE: D-Dimer, Quant: 1.38 ug/mL-FEU — ABNORMAL HIGH (ref 0.00–0.50)

## 2018-07-13 LAB — BASIC METABOLIC PANEL
Anion gap: 10 (ref 5–15)
BUN: 17 mg/dL (ref 8–23)
CO2: 21 mmol/L — ABNORMAL LOW (ref 22–32)
Calcium: 9 mg/dL (ref 8.9–10.3)
Chloride: 105 mmol/L (ref 98–111)
Creatinine, Ser: 0.91 mg/dL (ref 0.61–1.24)
GFR calc Af Amer: >60 mL/min (ref >60)
Glucose, Bld: 143 mg/dL — ABNORMAL HIGH (ref 70–99)
Potassium: 3.4 mmol/L — ABNORMAL LOW (ref 3.5–5.1)
Sodium: 136 mmol/L (ref 135–145)

## 2018-07-13 LAB — TSH: TSH: 1.527 u[IU]/mL (ref 0.350–4.500)

## 2018-07-13 LAB — LACTIC ACID, PLASMA: Lactic Acid, Venous: 1.8 mmol/L (ref 0.5–1.9)

## 2018-07-13 LAB — TRIGLYCERIDES: TRIGLYCERIDES: 101 mg/dL (ref <150)

## 2018-07-13 LAB — T4, FREE: Free T4: 0.8 ng/dL — ABNORMAL LOW (ref 0.82–1.77)

## 2018-07-13 LAB — MAGNESIUM: Magnesium: 2.3 mg/dL (ref 1.7–2.4)

## 2018-07-13 NOTE — Progress Notes (Addendum)
Pulmonary Critical Care Medicine [facility] [place]   PULMONARY CRITICAL CARE SERVICE  PROGRESS NOTE  Date of Service: 07/13/2018  [name] [name]  KVQ:[id number]  DOB: [birth year]   DOA: 07/02/2018  Referring Physician: Carron Curie, MD  HPI: [name] [name] is a 63 y.o. male seen for follow up of Acute on Chronic Respiratory Failure.  Patient continues to have excessive white frothy secretions.  His heart rate remains elevated and his blood pressure is soft at this time.  Remains intubated with endotracheal tube.  Medications: Reviewed on Rounds  Physical Exam:  Vitals: Pulse 116 respirations 26 BP 124/74 O2 sat 97 temp 100.4  Ventilator Settings ventilator mode AC VC rate of 25 tidal volume 440 PEEP of 5 FiO2 30%  . General: Comfortable at this time . Eyes: Grossly normal lids, irises & conjunctiva . ENT: grossly tongue is normal . Neck: no obvious mass . Cardiovascular: S1 S2 normal no gallop . Respiratory: No rales or rhonchi noted . Abdomen: soft . Skin: no rash seen on limited exam . Musculoskeletal: not rigid . Psychiatric:unable to assess . Neurologic: no seizure no involuntary movements         Lab Data:   Basic Metabolic Panel: Recent Labs  Lab 07/08/18 0503 07/09/18 0520 07/10/18 0224 07/11/18 0526 07/12/18 0513 07/13/18 0820  NA 133* 133* 132* 136 136 136  K 4.0 3.4* 3.5 3.6 3.7 3.4*  CL 103 103 102 105 103 105  CO2 19* 21* 21* 21* 21* 21*  GLUCOSE 130* 136* 138* 116* 132* 143*  BUN 17 16 17 16 17 17   CREATININE 0.81 0.81 0.89 0.87 0.85 0.91  CALCIUM 8.5* 8.7* 8.3* 8.7* 8.7* 9.0  MG 2.0  --  2.1 2.2 2.2  --   PHOS 4.4  --   --  4.3 5.0*  --     ABG: No results for input(s): PHART, PCO2ART, PO2ART, HCO3, O2SAT in the last 168 hours.  Liver Function Tests: Recent Labs  Lab 07/11/18 0526  ALBUMIN 2.8*   No results for input(s): LIPASE, AMYLASE in the last 168 hours. No results for input(s): AMMONIA in the last 168  hours.  CBC: Recent Labs  Lab 07/07/18 0225 07/08/18 0503 07/09/18 0520 07/11/18 0526 07/12/18 0513  WBC 14.6* 11.4* 11.1* 10.3 10.8*  HGB 14.6 13.5 13.9 13.7 13.2  HCT 44.0 42.7 43.8 44.4 42.1  MCV 90.0 91.2 91.3 92.9 92.1  PLT 166 181 217 232 249    Cardiac Enzymes: No results for input(s): CKTOTAL, CKMB, CKMBINDEX, TROPONINI in the last 168 hours.  BNP (last 3 results) No results for input(s): BNP in the last 8760 hours.  ProBNP (last 3 results) No results for input(s): PROBNP in the last 8760 hours.  Radiological Exams: Dg Chest Port 1 View  Result Date: 07/12/2018 CLINICAL DATA:  Follow-up of pleural effusion. Hypertension. Schizophrenia. EXAM: PORTABLE CHEST 1 VIEW COMPARISON:  07/05/2018 FINDINGS: Endotracheal tube terminates 3.0 Cm above carina. A left internal jugular line tip at mid SVC. Nasogastric tube extends beyond the inferior aspect of the film. Normal heart size for level of inspiration. Small bilateral pleural effusions are new or increased. No pneumothorax. Low lung volumes. Worsened aeration with increase in mild interstitial edema and bibasilar airspace disease. IMPRESSION: Slightly worsened aeration with congestive heart failure, increased bibasilar airspace disease, and layering bilateral pleural effusions. Aortic Atherosclerosis (ICD10-I70.0). Electronically Signed   By: Jeronimo Greaves M.D.   On: 07/12/2018 15:34    Assessment/Plan Active Problems:   Acute  on chronic respiratory failure with hypoxia (HCC)   COPD, severe (HCC)   Chronic atrial fibrillation   Acute systolic heart failure (HCC)   Lobar pneumonia, unspecified organism (HCC)   1. Acute on chronic respiratory failure with hypoxia continue full vent support.  ENT will see patient this week to evaluate for trach. 2. Severe COPD required 2 intubations previously.  Get tracheostomy to assist with weaning patient. 3. Chronic atrial fibrillation rate controlled 4. Acute systolic heart failure  unchanged 5. Lobar pneumonia treated continue present management   I have personally seen and evaluated the patient, evaluated laboratory and imaging results, formulated the assessment and plan and placed orders. The Patient requires high complexity decision making for assessment and support.  Case was discussed on Rounds with the Respiratory Therapy Staff  Yevonne Pax, MD [facility] Pulmonary Critical Care Medicine Sleep Medicine
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
## 2018-07-14 ENCOUNTER — Encounter (HOSPITAL_COMMUNITY): Payer: Self-pay | Admitting: Anesthesiology

## 2018-07-14 ENCOUNTER — Other Ambulatory Visit (HOSPITAL_COMMUNITY): Payer: Medicare Other

## 2018-07-14 DIAGNOSIS — J9621 Acute and chronic respiratory failure with hypoxia: Secondary | ICD-10-CM | POA: Diagnosis not present

## 2018-07-14 DIAGNOSIS — I482 Chronic atrial fibrillation, unspecified: Secondary | ICD-10-CM | POA: Diagnosis not present

## 2018-07-14 DIAGNOSIS — J449 Chronic obstructive pulmonary disease, unspecified: Secondary | ICD-10-CM | POA: Diagnosis not present

## 2018-07-14 DIAGNOSIS — I5021 Acute systolic (congestive) heart failure: Secondary | ICD-10-CM | POA: Diagnosis not present

## 2018-07-14 LAB — CBC
HCT: 42.4 % (ref 39.0–52.0)
Hemoglobin: 13.6 g/dL (ref 13.0–17.0)
MCH: 29.3 pg (ref 26.0–34.0)
MCHC: 32.1 g/dL (ref 30.0–36.0)
MCV: 91.4 fL (ref 80.0–100.0)
Platelets: 287 10*3/uL (ref 150–400)
RBC: 4.64 MIL/uL (ref 4.22–5.81)
RDW: 15.4 % (ref 11.5–15.5)
WBC: 14.8 10*3/uL — ABNORMAL HIGH (ref 4.0–10.5)
nRBC: 0 % (ref 0.0–0.2)

## 2018-07-14 LAB — RENAL FUNCTION PANEL
Albumin: 2.7 g/dL — ABNORMAL LOW (ref 3.5–5.0)
Anion gap: 10 (ref 5–15)
BUN: 22 mg/dL (ref 8–23)
CO2: 19 mmol/L — ABNORMAL LOW (ref 22–32)
Calcium: 7.9 mg/dL — ABNORMAL LOW (ref 8.9–10.3)
Chloride: 105 mmol/L (ref 98–111)
Creatinine, Ser: 0.92 mg/dL (ref 0.61–1.24)
GFR calc Af Amer: >60 mL/min
GFR calc non Af Amer: >60 mL/min
Glucose, Bld: 152 mg/dL — ABNORMAL HIGH (ref 70–99)
Phosphorus: 3.4 mg/dL (ref 2.5–4.6)
Potassium: 3.2 mmol/L — ABNORMAL LOW (ref 3.5–5.1)
Sodium: 134 mmol/L — ABNORMAL LOW (ref 135–145)

## 2018-07-14 LAB — URINALYSIS, ROUTINE W REFLEX MICROSCOPIC
Bilirubin Urine: NEGATIVE
Glucose, UA: NEGATIVE mg/dL
Ketones, ur: NEGATIVE mg/dL
Nitrite: NEGATIVE
Protein, ur: NEGATIVE mg/dL
Specific Gravity, Urine: 1.016 (ref 1.005–1.030)
pH: 7 (ref 5.0–8.0)

## 2018-07-14 LAB — BLOOD CULTURE ID PANEL (REFLEXED)

## 2018-07-14 LAB — MAGNESIUM: Magnesium: 2.2 mg/dL (ref 1.7–2.4)

## 2018-07-14 NOTE — Progress Notes (Signed)
Pulmonary Critical Care Medicine [facility] [place]   PULMONARY CRITICAL CARE SERVICE  PROGRESS NOTE  Date of Service: 07/14/2018  [name] [name]  FIE:[id number]  DOB: 10/16/1955   DOA: 07/02/2018  Referring Physician: Carron Curie, MD  HPI: [name] [name] is a 63 y.o. male seen for follow up of Acute on Chronic Respiratory Failure.  Patient is on pressure support mode remains endotracheally intubated.  Spoke with ENT regarding timing of tracheostomy.  Patient remains critically ill has been requiring more sedation also so therefore I think that patient is going to be prolonged mechanical ventilation.  Medications: Reviewed on Rounds  Physical Exam:  Vitals: Temperature 96.5 pulse 101 respiratory rate 27 blood pressure 96/63 saturations are 98%  Ventilator Settings mode of ventilation pressure support FiO2 35% tidal volume 540 pressure support 12 PEEP 5  . General: Comfortable at this time . Eyes: Grossly normal lids, irises & conjunctiva . ENT: grossly tongue is normal . Neck: no obvious mass . Cardiovascular: S1 S2 normal no gallop . Respiratory: No rhonchi or rales are noted at this time . Abdomen: soft . Skin: no rash seen on limited exam . Musculoskeletal: not rigid . Psychiatric:unable to assess . Neurologic: no seizure no involuntary movements         Lab Data:   Basic Metabolic Panel: Recent Labs  Lab 07/08/18 0503  07/10/18 0224 07/11/18 0526 07/12/18 0513 07/13/18 0820 07/13/18 1822 07/14/18 0548  NA 133*   < > 132* 136 136 136 133* 134*  K 4.0   < > 3.5 3.6 3.7 3.4* 4.6 3.2*  CL 103   < > 102 105 103 105 100 105  CO2 19*   < > 21* 21* 21* 21* 21* 19*  GLUCOSE 130*   < > 138* 116* 132* 143* 217* 152*  BUN 17   < > 17 16 17 17 22 22   CREATININE 0.81   < > 0.89 0.87 0.85 0.91 1.20 0.92  CALCIUM 8.5*   < > 8.3* 8.7* 8.7* 9.0 9.3 7.9*  MG 2.0  --  2.1 2.2 2.2  --  2.3 2.2  PHOS 4.4  --   --  4.3 5.0*  --  4.8* 3.4   < > = values in this  interval not displayed.    ABG: Recent Labs  Lab 07/13/18 2205  PHART 7.429  PCO2ART 32.2  PO2ART 93.3  HCO3 20.9  O2SAT 97.2    Liver Function Tests: Recent Labs  Lab 07/11/18 0526 07/13/18 1822 07/14/18 0548  ALBUMIN 2.8* 3.4* 2.7*   No results for input(s): LIPASE, AMYLASE in the last 168 hours. No results for input(s): AMMONIA in the last 168 hours.  CBC: Recent Labs  Lab 07/09/18 0520 07/11/18 0526 07/12/18 0513 07/13/18 1822 07/14/18 0548  WBC 11.1* 10.3 10.8* 18.5* 14.8*  HGB 13.9 13.7 13.2 15.1 13.6  HCT 43.8 44.4 42.1 47.3 42.4  MCV 91.3 92.9 92.1 91.8 91.4  PLT 217 232 249 373 287    Cardiac Enzymes: No results for input(s): CKTOTAL, CKMB, CKMBINDEX, TROPONINI in the last 168 hours.  BNP (last 3 results) No results for input(s): BNP in the last 8760 hours.  ProBNP (last 3 results) No results for input(s): PROBNP in the last 8760 hours.  Radiological Exams: Dg Chest Port 1 View  Result Date: 07/14/2018 CLINICAL DATA:  Endotracheal tube EXAM: PORTABLE CHEST 1 VIEW COMPARISON:  Yesterday FINDINGS: Endotracheal tube tip just below the clavicular heads. The orogastric tube at least reaches  the stomach. Left IJ line with tip at the SVC. Stable symmetric haziness of the lower chest. On most recent chest CT 06/30/2018 there was layering pleural effusions and atelectasis. No Kerley lines or pneumothorax. Normal heart size. IMPRESSION: Stable hardware positioning and lower lung opacities. Electronically Signed   By: Marnee Spring M.D.   On: 07/14/2018 06:26   Dg Chest Port 1 View  Result Date: 07/13/2018 CLINICAL DATA:  Acute respiratory distress. EXAM: PORTABLE CHEST 1 VIEW COMPARISON:  July 12, 2018 FINDINGS: ETT and left central line are in good position and unchanged. No pneumothorax. The NG tube terminates below today's film. Stable cardiomegaly. The hila and mediastinum are unchanged. Mild bibasilar opacities are stable. No other acute abnormalities.  IMPRESSION: 1. Support apparatus as above. 2. Bibasilar opacities remain.  Recommend follow-up to resolution. Electronically Signed   By: Gerome Sam III M.D   On: 07/13/2018 18:15    Assessment/Plan Active Problems:   Acute on chronic respiratory failure with hypoxia (HCC)   COPD, severe (HCC)   Chronic atrial fibrillation   Acute systolic heart failure (HCC)   Lobar pneumonia, unspecified organism (HCC)   1. Acute on chronic respiratory failure with hypoxia we will continue with pressure support at this time.  However patient is going to need a tracheostomy patient has been requiring heavy sedation to keep him from failing his weans ironically. 2. Severe COPD at baseline we will continue with supportive care 3. Chronic atrial fibrillation rate is controlled 4. Acute systolic heart failure radiological studies have been reviewed diuretics as tolerated. 5. Lobar pneumonia treated we will continue with present management   I have personally seen and evaluated the patient, evaluated laboratory and imaging results, formulated the assessment and plan and placed orders. The Patient requires high complexity decision making for assessment and support.  Case was discussed on Rounds with the Respiratory Therapy Staff  Yevonne Pax, MD [facility] Pulmonary Critical Care Medicine Sleep Medicine

## 2018-07-14 NOTE — Anesthesia Preprocedure Evaluation (Deleted)
Anesthesia Evaluation    Reviewed: Allergy & Precautions, Patient's Chart, lab work & pertinent test results, Unable to perform ROS - Chart review only  Airway        Dental   Pulmonary COPD, Current Smoker,  Acute on chronic respiratory failure          Cardiovascular hypertension, Pt. on home beta blockers and Pt. on medications + Past MI  + dysrhythmias Atrial Fibrillation      Neuro/Psych  Headaches, Seizures -,  PSYCHIATRIC DISORDERS Anxiety Depression Bipolar Disorder Schizophrenia  Neuromuscular disease    GI/Hepatic negative GI ROS, (+) Hepatitis -, C  Endo/Other  negative endocrine ROS  Renal/GU negative Renal ROS     Musculoskeletal  (+) Arthritis ,   Abdominal   Peds  Hematology negative hematology ROS (+)   Anesthesia Other Findings Day of surgery medications reviewed with the patient.  Reproductive/Obstetrics                             Anesthesia Physical Anesthesia Plan  ASA: IV  Anesthesia Plan: General   Post-op Pain Management:    Induction: Intravenous and Inhalational  PONV Risk Score and Plan: 2 and Midazolam, Dexamethasone and Ondansetron  Airway Management Planned: Tracheostomy and Oral ETT  Additional Equipment:   Intra-op Plan:   Post-operative Plan:   Informed Consent:   Plan Discussed with:   Anesthesia Plan Comments:         Anesthesia Quick Evaluation

## 2018-07-15 ENCOUNTER — Encounter (HOSPITAL_COMMUNITY): Payer: Medicare Other | Admitting: Anesthesiology

## 2018-07-15 ENCOUNTER — Inpatient Hospital Stay: Admit: 2018-07-15 | Payer: Medicare Other | Admitting: Otolaryngology

## 2018-07-15 ENCOUNTER — Encounter
Admission: RE | Disposition: A | Discharge status: Other Acute Inpatient Hospital | Payer: Self-pay | Source: Other Acute Inpatient Hospital | Attending: Internal Medicine

## 2018-07-15 ENCOUNTER — Other Ambulatory Visit (HOSPITAL_COMMUNITY): Payer: Medicare Other

## 2018-07-15 DIAGNOSIS — J9621 Acute and chronic respiratory failure with hypoxia: Secondary | ICD-10-CM | POA: Diagnosis not present

## 2018-07-15 DIAGNOSIS — J449 Chronic obstructive pulmonary disease, unspecified: Secondary | ICD-10-CM | POA: Diagnosis not present

## 2018-07-15 DIAGNOSIS — I5021 Acute systolic (congestive) heart failure: Secondary | ICD-10-CM | POA: Diagnosis not present

## 2018-07-15 DIAGNOSIS — J9620 Acute and chronic respiratory failure, unspecified whether with hypoxia or hypercapnia: Secondary | ICD-10-CM | POA: Diagnosis not present

## 2018-07-15 DIAGNOSIS — I482 Chronic atrial fibrillation, unspecified: Secondary | ICD-10-CM | POA: Diagnosis not present

## 2018-07-15 HISTORY — PX: TRACHEOSTOMY TUBE PLACEMENT: SHX814

## 2018-07-15 LAB — POTASSIUM: Potassium: 3.3 mmol/L — ABNORMAL LOW (ref 3.5–5.1)

## 2018-07-15 SURGERY — CREATION, TRACHEOSTOMY
Anesthesia: General

## 2018-07-15 SURGERY — CREATION, TRACHEOSTOMY
Anesthesia: General | Site: Neck

## 2018-07-15 MED ORDER — SODIUM CHLORIDE 0.9 % IV SOLN
INTRAVENOUS | Status: DC | PRN
  Administered 2018-07-15: 08:00:00 via INTRAVENOUS

## 2018-07-15 MED ORDER — DEXAMETHASONE SODIUM PHOSPHATE 10 MG/ML IJ SOLN
INTRAMUSCULAR | Status: AC
  Filled 2018-07-15: qty 2

## 2018-07-15 MED ORDER — LIDOCAINE-EPINEPHRINE 1 %-1:100000 IJ SOLN
INTRAMUSCULAR | Status: DC | PRN
  Administered 2018-07-15: 20 mL

## 2018-07-15 MED ORDER — ROCURONIUM BROMIDE 50 MG/5ML IV SOSY
PREFILLED_SYRINGE | INTRAVENOUS | Status: AC
  Filled 2018-07-15: qty 5

## 2018-07-15 MED ORDER — EPHEDRINE 5 MG/ML INJ
INTRAVENOUS | Status: AC
  Filled 2018-07-15: qty 10

## 2018-07-15 MED ORDER — ROCURONIUM BROMIDE 100 MG/10ML IV SOLN
INTRAVENOUS | Status: DC | PRN
  Administered 2018-07-15: 50 mg via INTRAVENOUS

## 2018-07-15 MED ORDER — MIDAZOLAM HCL 5 MG/5ML IJ SOLN
INTRAMUSCULAR | Status: DC | PRN
  Administered 2018-07-15: 2 mg via INTRAVENOUS

## 2018-07-15 MED ORDER — SUCCINYLCHOLINE CHLORIDE 200 MG/10ML IV SOSY
PREFILLED_SYRINGE | INTRAVENOUS | Status: AC
  Filled 2018-07-15: qty 10

## 2018-07-15 MED ORDER — FENTANYL CITRATE (PF) 250 MCG/5ML IJ SOLN
INTRAMUSCULAR | Status: DC | PRN
  Administered 2018-07-15: 50 ug via INTRAVENOUS

## 2018-07-15 MED ORDER — ONDANSETRON HCL 4 MG/2ML IJ SOLN
INTRAMUSCULAR | Status: AC
  Filled 2018-07-15: qty 2

## 2018-07-15 MED ORDER — DILTIAZEM HCL 100 MG IV SOLR
INTRAVENOUS | Status: DC | PRN
  Administered 2018-07-15: 10 mg/h via INTRAVENOUS

## 2018-07-15 MED ORDER — PHENYLEPHRINE 40 MCG/ML (10ML) SYRINGE FOR IV PUSH (FOR BLOOD PRESSURE SUPPORT)
PREFILLED_SYRINGE | INTRAVENOUS | Status: AC
  Filled 2018-07-15: qty 10

## 2018-07-15 MED ORDER — PROPOFOL 500 MG/50ML IV EMUL
INTRAVENOUS | Status: DC | PRN
  Administered 2018-07-15: 20 ug/kg/min via INTRAVENOUS

## 2018-07-15 MED ORDER — MIDAZOLAM HCL 2 MG/2ML IJ SOLN
INTRAMUSCULAR | Status: AC
  Filled 2018-07-15: qty 2

## 2018-07-15 MED ORDER — PROPOFOL 10 MG/ML IV BOLUS
INTRAVENOUS | Status: AC
  Filled 2018-07-15: qty 20

## 2018-07-15 MED ORDER — 0.9 % SODIUM CHLORIDE (POUR BTL) OPTIME
TOPICAL | Status: DC | PRN
  Administered 2018-07-15: 1000 mL

## 2018-07-15 MED ORDER — PHENYLEPHRINE HCL 10 MG/ML IJ SOLN
INTRAMUSCULAR | Status: DC | PRN
  Administered 2018-07-15: 200 ug via INTRAVENOUS
  Administered 2018-07-15: 120 ug via INTRAVENOUS
  Administered 2018-07-15: 200 ug via INTRAVENOUS
  Administered 2018-07-15: 160 ug via INTRAVENOUS

## 2018-07-15 MED ORDER — STERILE WATER FOR IRRIGATION IR SOLN
Status: DC | PRN
  Administered 2018-07-15: 400 mL

## 2018-07-15 MED ORDER — FENTANYL CITRATE (PF) 250 MCG/5ML IJ SOLN
INTRAMUSCULAR | Status: AC
  Filled 2018-07-15: qty 5

## 2018-07-15 MED ORDER — LIDOCAINE 2% (20 MG/ML) 5 ML SYRINGE
INTRAMUSCULAR | Status: AC
  Filled 2018-07-15: qty 5

## 2018-07-15 SURGICAL SUPPLY — 44 items
ATTRACTOMAT 16X20 MAGNETIC DRP (DRAPES) IMPLANT
BLADE SURG 15 STRL LF DISP TIS (BLADE) ×1 IMPLANT
BLADE SURG 15 STRL SS (BLADE) ×2
CLEANER TIP ELECTROSURG 2X2 (MISCELLANEOUS) ×3 IMPLANT
COVER SURGICAL LIGHT HANDLE (MISCELLANEOUS) ×3 IMPLANT
COVER WAND RF STERILE (DRAPES) IMPLANT
DRAPE HALF SHEET 40X57 (DRAPES) IMPLANT
ELECT COATED BLADE 2.86 ST (ELECTRODE) ×3 IMPLANT
ELECT REM PT RETURN 9FT ADLT (ELECTROSURGICAL) ×3
ELECTRODE REM PT RTRN 9FT ADLT (ELECTROSURGICAL) ×1 IMPLANT
GAUZE 4X4 16PLY RFD (DISPOSABLE) ×3 IMPLANT
GEL ULTRASOUND 20GR AQUASONIC (MISCELLANEOUS) ×3 IMPLANT
GLOVE BIOGEL PI IND STRL 6.5 (GLOVE) ×1 IMPLANT
GLOVE BIOGEL PI IND STRL 8 (GLOVE) ×1 IMPLANT
GLOVE BIOGEL PI INDICATOR 6.5 (GLOVE) ×2
GLOVE BIOGEL PI INDICATOR 8 (GLOVE) ×2
GLOVE SS BIOGEL STRL SZ 7.5 (GLOVE) IMPLANT
GLOVE SUPERSENSE BIOGEL SZ 7.5 (GLOVE)
GLOVE SURG SS PI 7.5 STRL IVOR (GLOVE) ×3 IMPLANT
GOWN STRL REUS W/ TWL LRG LVL3 (GOWN DISPOSABLE) ×1 IMPLANT
GOWN STRL REUS W/ TWL XL LVL3 (GOWN DISPOSABLE) ×1 IMPLANT
GOWN STRL REUS W/TWL LRG LVL3 (GOWN DISPOSABLE) ×2
GOWN STRL REUS W/TWL XL LVL3 (GOWN DISPOSABLE) ×2
HOLDER TRACH TUBE VELCRO 19.5 (MISCELLANEOUS) ×3 IMPLANT
KIT BASIN OR (CUSTOM PROCEDURE TRAY) ×3 IMPLANT
KIT SUCTION CATH 14FR (SUCTIONS) ×3 IMPLANT
KIT TURNOVER KIT B (KITS) ×3 IMPLANT
NEEDLE HYPO 25GX1X1/2 BEV (NEEDLE) ×3 IMPLANT
NS IRRIG 1000ML POUR BTL (IV SOLUTION) ×3 IMPLANT
PACK EENT II TURBAN DRAPE (CUSTOM PROCEDURE TRAY) ×3 IMPLANT
PAD ARMBOARD 7.5X6 YLW CONV (MISCELLANEOUS) ×3 IMPLANT
PENCIL BUTTON HOLSTER BLD 10FT (ELECTRODE) ×3 IMPLANT
SPONGE DRAIN TRACH 4X4 STRL 2S (GAUZE/BANDAGES/DRESSINGS) ×3 IMPLANT
SPONGE INTESTINAL PEANUT (DISPOSABLE) ×3 IMPLANT
SUT SILK 2 0 SH CR/8 (SUTURE) ×3 IMPLANT
SUT SILK 3 0 TIES 10X30 (SUTURE) IMPLANT
SYR 5ML LUER SLIP (SYRINGE) ×3 IMPLANT
SYR CONTROL 10ML LL (SYRINGE) ×3 IMPLANT
TOWEL OR 17X24 6PK STRL BLUE (TOWEL DISPOSABLE) ×3 IMPLANT
TOWEL OR 17X26 10 PK STRL BLUE (TOWEL DISPOSABLE) IMPLANT
TUBE CONNECTING 12'X1/4 (SUCTIONS) ×1
TUBE CONNECTING 12X1/4 (SUCTIONS) ×2 IMPLANT
TUBE TRACH SHILEY  6 DIST  CUF (TUBING) ×3 IMPLANT
TUBE TRACH SHILEY 8 DIST CUF (TUBING) IMPLANT
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
## 2018-07-15 NOTE — Interval H&P Note (Signed)
History and Physical Interval Note:  07/15/2018 7:46 AM  [name]  has presented today for surgery, with the diagnosis of respiratory  failure.  The various methods of treatment have been discussed with the patient and family. After consideration of risks, benefits and other options for treatment, the patient has consented to  Procedure(s): TRACHEOSTOMY (N/A) as a surgical intervention.  The patient's history has been reviewed, patient examined, no change in status, stable for surgery.  I have reviewed the patient's chart and labs.  Questions were answered to the patient's satisfaction.     Dillard Cannon

## 2018-07-15 NOTE — Op Note (Signed)
NAMECALLETANO, KURZWEIL MEDICAL RECORD [id number] ACCOUNT [id number] DATE OF BIRTH:10-30-1955 FACILITY: [facility] LOCATION: [facility] PHYSICIAN:CHRISTOPHER Braxton Feathers, MD  OPERATIVE REPORT  DATE OF PROCEDURE:  07/15/2018  PREOPERATIVE DIAGNOSIS:  Acute on chronic respiratory failure.  POSTOPERATIVE DIAGNOSIS:  Acute on chronic respiratory failure.  OPERATION PERFORMED:  Tracheostomy with a #6 Shiley cuffed tube.  SURGEON:  Dillard Cannon, MD  ANESTHESIA:  General endotracheal.  ESTIMATED BLOOD LOSS:  Minimal.  COMPLICATIONS:  None.  BRIEF CLINICAL NOTE:  The patient is a 63 year old gentleman who has history of chronic AFib, severe COPD, systolic heart failure and coronary artery disease.  He has history of hepatitis C.  He was admitted to an outside hospital and intubated on  06/29/2018.  He was subsequently transferred to [facility] on 07/02/2018 and has been intubated on vent since that time.  Because of poor prognosis and slow weaning, a tracheostomy was recommended.  He was taken to the operating room at  this time for a tracheostomy.  DESCRIPTION OF PROCEDURE:  The patient was brought straight down from [facility] down to the operating room.  He underwent general anesthesia with the already placed endotracheal tube.  Neck was extended.  The area of proposed incision  site was injected with 5 mL of Xylocaine with epinephrine.  A vertical incision was made just below the cricoid cartilage midline.  Dissection was carried down through subcutaneous tissue.  Strap muscles were divided and retracted laterally.  The thyroid  isthmus was divided with cautery.  The first 3 tracheal rings were exposed, and a horizontal tracheotomy was made between the first and second tracheal rings.  A #6 Shiley tracheostomy tube was positioned.  The patient's endotracheal tube was removed.   The patient was ventilated well.  This was secured to the neck with 2-0 silk  sutures x4 and Velcro trach collar around the neck.  The orogastric tube had been removed with the endotracheal tube, and a nasogastric tube was placed prior to transferring the  patient back to [facility]  LN/NUANCE  D:07/15/2018 T:07/15/2018 JOB:[id number]

## 2018-07-15 NOTE — Transfer of Care (Signed)
Immediate Anesthesia Transfer of Care Note  Patient: [name] [name]  Procedure(s) Performed: TRACHEOSTOMY (N/A Neck)  Patient Location: [facility]  Anesthesia Type:General  Level of Consciousness: sedated and Patient remains intubated per anesthesia plan  Airway & Oxygen Therapy: Patient remains intubated per anesthesia plan and Patient placed on Ventilator (see vital sign flow sheet for setting) via trach   Post-op Assessment: Report given to RN and Post -op Vital signs reviewed and stable  Post vital signs: Reviewed and stable  Last Vitals:  Vitals Value Taken Time  BP    Temp    Pulse    Resp    SpO2      Last Pain: There were no vitals filed for this visit.       Complications: No apparent anesthesia complications

## 2018-07-15 NOTE — Anesthesia Preprocedure Evaluation (Signed)
Anesthesia Evaluation  Patient identified by MRN, date of birth, ID band Patient unresponsive    Reviewed: Allergy & Precautions, NPO status , Patient's Chart, lab work & pertinent test results, Unable to perform ROS - Chart review only  Airway Mallampati: Intubated  TM Distance: >3 FB Neck ROM: Full    Dental  (+) Dental Advisory Given, Teeth Intact   Pulmonary pneumonia, COPD, Current Smoker,  Acute on chronic respiratory failure   Pulmonary exam normal breath sounds clear to auscultation       Cardiovascular hypertension, Pt. on home beta blockers and Pt. on medications + Past MI  + dysrhythmias Atrial Fibrillation  Rhythm:Irregular Rate:Abnormal  Echo 07/07/2018: IMPRESSIONS    1. The left ventricle has a visually estimated ejection fraction of of 15-20 %. The cavity size was moderately dilated. Findings are consistent with dilated cardiomyopathy. Left ventricular diastology could not be evaluated secondary to atrial  fibrillation. Left ventricular diffuse hypokinesis.  2. The right ventricle has severely reduced systolic function. The cavity was normal. There is no increase in right ventricular wall thickness.  3. Left atrial size was mild-moderately dilated.  4. Moderate pleural effusion in both left and right lateral regions.  5. The mitral valve is degenerative. There is mild to moderate mitral annular calcification present. A Possible small mobile vegetation is seen on the anterior mitral leaflet., with chordal involvment.  6. The tricuspid valve is normal in structure.  7. The aortic valve is tricuspid Moderate thickening of the aortic valve Moderate calcification of the aortic valve.Marland Kitchen aortic valve vegetation cannot be excluded.  8. There is evidence of plaque in the aortic root and ascending aorta.   Neuro/Psych  Headaches, Seizures -,  PSYCHIATRIC DISORDERS Anxiety Depression Bipolar Disorder Schizophrenia   Neuromuscular disease    GI/Hepatic negative GI ROS, (+) Hepatitis -, C  Endo/Other  negative endocrine ROS  Renal/GU negative Renal ROS     Musculoskeletal  (+) Arthritis ,   Abdominal   Peds  Hematology negative hematology ROS (+)   Anesthesia Other Findings Day of surgery medications reviewed with the patient.  Reproductive/Obstetrics                             Anesthesia Physical  Anesthesia Plan  ASA: IV  Anesthesia Plan: General   Post-op Pain Management:    Induction: Intravenous and Inhalational  PONV Risk Score and Plan: 2 and Midazolam, Dexamethasone and Ondansetron  Airway Management Planned: Tracheostomy and Oral ETT  Additional Equipment:   Intra-op Plan:   Post-operative Plan: Post-operative intubation/ventilation  Informed Consent: I have reviewed the patients History and Physical, chart, labs and discussed the procedure including the risks, benefits and alternatives for the proposed anesthesia with the patient or authorized representative who has indicated his/her understanding and acceptance.     History available from chart only  Plan Discussed with: CRNA  Anesthesia Plan Comments:         Anesthesia Quick Evaluation

## 2018-07-15 NOTE — Brief Op Note (Signed)
07/15/2018  8:49 AM  PATIENT:  [name] [name]  63 y.o. male  PRE-OPERATIVE DIAGNOSIS:  respiratory  failure  POST-OPERATIVE DIAGNOSIS:  respiratory  failure  PROCEDURE:  Procedure(s): TRACHEOSTOMY (N/A) # 6 Shiley  SURGEON:  Surgeon(s) and Role:    * Ezzard Standing, Kristine Garbe, MD - Primary  PHYSICIAN ASSISTANT:   ASSISTANTS: none   ANESTHESIA:   general  EBL:  minimal   BLOOD ADMINISTERED:none  DRAINS: none   LOCAL MEDICATIONS USED:  XYLOCAINE with EPI 5 cc  SPECIMEN:  No Specimen  DISPOSITION OF SPECIMEN:  N/A  COUNTS:  YES  TOURNIQUET:  * No tourniquets in log *  DICTATION: .Other Dictation: Dictation Number [id number]  PLAN OF CARE: Discharge to home after PACU  PATIENT DISPOSITION:  PACU - hemodynamically stable.   Delay start of Pharmacological VTE agent (>24hrs) due to surgical blood loss or risk of bleeding: yes

## 2018-07-15 NOTE — Progress Notes (Addendum)
Pulmonary Critical Care Medicine [facility]   PULMONARY CRITICAL CARE SERVICE  PROGRESS NOTE  Date of Service: 07/15/2018  [name] [name]  OVF:[id number]  DOB: 11/13/1955   DOA: 07/02/2018  Referring Physician: Carron Curie, MD  HPI: [name] [name] is a 63 y.o. male seen for follow up of Acute on Chronic Respiratory Failure.  Patient received tracheostomy this a.m. no weaning today patient remains on full support on ventilator with a 35% FiO2.  Medications: Reviewed on Rounds  Physical Exam:  Vitals: Pulse 71 respirations 24 BP 91/53 O2 sat 96% temp 96.7  Ventilator Settings ventilator mode AC VC rate of 25 tidal volume 400 PEEP of 5 FiO2 35%  . General: Comfortable at this time . Eyes: Grossly normal lids, irises & conjunctiva . ENT: grossly tongue is normal . Neck: no obvious mass . Cardiovascular: S1 S2 normal no gallop . Respiratory: No rhonchi rales noted . Abdomen: soft . Skin: no rash seen on limited exam . Musculoskeletal: not rigid . Psychiatric:unable to assess . Neurologic: no seizure no involuntary movements         Lab Data:   Basic Metabolic Panel: Recent Labs  Lab 07/10/18 0224 07/11/18 0526 07/12/18 0513 07/13/18 0820 07/13/18 1822 07/14/18 0548 07/15/18 0535  NA 132* 136 136 136 133* 134*  --   K 3.5 3.6 3.7 3.4* 4.6 3.2* 3.3*  CL 102 105 103 105 100 105  --   CO2 21* 21* 21* 21* 21* 19*  --   GLUCOSE 138* 116* 132* 143* 217* 152*  --   BUN 17 16 17 17 22 22   --   CREATININE 0.89 0.87 0.85 0.91 1.20 0.92  --   CALCIUM 8.3* 8.7* 8.7* 9.0 9.3 7.9*  --   MG 2.1 2.2 2.2  --  2.3 2.2  --   PHOS  --  4.3 5.0*  --  4.8* 3.4  --     ABG: Recent Labs  Lab 07/13/18 2205  PHART 7.429  PCO2ART 32.2  PO2ART 93.3  HCO3 20.9  O2SAT 97.2    Liver Function Tests: Recent Labs  Lab 07/11/18 0526 07/13/18 1822 07/14/18 0548  ALBUMIN 2.8* 3.4* 2.7*   No results for input(s): LIPASE, AMYLASE in the last 168 hours. No  results for input(s): AMMONIA in the last 168 hours.  CBC: Recent Labs  Lab 07/09/18 0520 07/11/18 0526 07/12/18 0513 07/13/18 1822 07/14/18 0548  WBC 11.1* 10.3 10.8* 18.5* 14.8*  HGB 13.9 13.7 13.2 15.1 13.6  HCT 43.8 44.4 42.1 47.3 42.4  MCV 91.3 92.9 92.1 91.8 91.4  PLT 217 232 249 373 287    Cardiac Enzymes: No results for input(s): CKTOTAL, CKMB, CKMBINDEX, TROPONINI in the last 168 hours.  BNP (last 3 results) No results for input(s): BNP in the last 8760 hours.  ProBNP (last 3 results) No results for input(s): PROBNP in the last 8760 hours.  Radiological Exams: Dg Chest Port 1 View  Result Date: 07/14/2018 CLINICAL DATA:  Endotracheal tube EXAM: PORTABLE CHEST 1 VIEW COMPARISON:  Yesterday FINDINGS: Endotracheal tube tip just below the clavicular heads. The orogastric tube at least reaches the stomach. Left IJ line with tip at the SVC. Stable symmetric haziness of the lower chest. On most recent chest CT 06/30/2018 there was layering pleural effusions and atelectasis. No Kerley lines or pneumothorax. Normal heart size. IMPRESSION: Stable hardware positioning and lower lung opacities. Electronically Signed   By: Marnee Spring M.D.   On: 07/14/2018 06:26  Dg Abd Portable 1v  Result Date: 07/15/2018 CLINICAL DATA:  Nasogastric tube placement. EXAM: PORTABLE ABDOMEN - 1 VIEW COMPARISON:  Radiograph of July 08, 2018. FINDINGS: The bowel gas pattern is normal. Distal tip of nasogastric tube is seen in proximal stomach. No radio-opaque calculi or other significant radiographic abnormality are seen. IMPRESSION: Distal tip of nasogastric tube is seen in proximal stomach. No evidence of bowel obstruction or ileus. Electronically Signed   By: Lupita Raider, M.D.   On: 07/15/2018 10:55    Assessment/Plan Active Problems:   Acute on chronic respiratory failure with hypoxia (HCC)   COPD, severe (HCC)   Chronic atrial fibrillation   Acute systolic heart failure (HCC)   Lobar  pneumonia, unspecified organism (HCC)   1. Acute on chronic respiratory failure with hypoxia continue with ventilator support at this time and progress to weaning again tomorrow.  Continue to monitor tracheostomy site. 2. Severe COPD at baseline continue supportive care 3. Chronic atrial fibrillation rate controlled 4. Acute systolic heart failure radiological studies have been reviewed diuretics as tolerated 5. Lobar pneumonia treated continue with present management   I have personally seen and evaluated the patient, evaluated laboratory and imaging results, formulated the assessment and plan and placed orders. The Patient requires high complexity decision making for assessment and support.  Case was discussed on Rounds with the Respiratory Therapy Staff  Yevonne Pax, MD [facility] Pulmonary Critical Care Medicine Sleep Medicine

## 2018-07-15 NOTE — Anesthesia Postprocedure Evaluation (Signed)
Anesthesia Post Note  Patient: [name] [name]  Procedure(s) Performed: TRACHEOSTOMY (N/A Neck)     Patient location during evaluation: Other [facility]) Anesthesia Type: General Level of consciousness: sedated and patient remains intubated per anesthesia plan Pain management: pain level controlled Vital Signs Assessment: post-procedure vital signs reviewed and stable Respiratory status: patient remains intubated per anesthesia plan Cardiovascular status: stable Postop Assessment: no apparent nausea or vomiting Anesthetic complications: no    Last Vitals: There were no vitals filed for this visit.  Last Pain: There were no vitals filed for this visit.               Cecile Hearing

## 2018-07-15 NOTE — OR Nursing (Signed)
Obturator taped to head of bed.

## 2018-07-16 ENCOUNTER — Other Ambulatory Visit (HOSPITAL_COMMUNITY): Payer: Medicare Other

## 2018-07-16 ENCOUNTER — Encounter (HOSPITAL_COMMUNITY): Payer: Self-pay | Admitting: Otolaryngology

## 2018-07-16 DIAGNOSIS — I5021 Acute systolic (congestive) heart failure: Secondary | ICD-10-CM | POA: Diagnosis not present

## 2018-07-16 DIAGNOSIS — J449 Chronic obstructive pulmonary disease, unspecified: Secondary | ICD-10-CM | POA: Diagnosis not present

## 2018-07-16 DIAGNOSIS — J9621 Acute and chronic respiratory failure with hypoxia: Secondary | ICD-10-CM | POA: Diagnosis not present

## 2018-07-16 DIAGNOSIS — I482 Chronic atrial fibrillation, unspecified: Secondary | ICD-10-CM | POA: Diagnosis not present

## 2018-07-16 LAB — CULTURE, BLOOD (ROUTINE X 2)
SPECIAL REQUESTS: ADEQUATE
Special Requests: ADEQUATE

## 2018-07-16 LAB — BASIC METABOLIC PANEL
ANION GAP: 10 (ref 5–15)
BUN: 18 mg/dL (ref 8–23)
CO2: 21 mmol/L — ABNORMAL LOW (ref 22–32)
Calcium: 8.9 mg/dL (ref 8.9–10.3)
Chloride: 106 mmol/L (ref 98–111)
Creatinine, Ser: 0.84 mg/dL (ref 0.61–1.24)
GFR calc Af Amer: >60 mL/min (ref >60)
GFR calc non Af Amer: >60 mL/min (ref >60)
Glucose, Bld: 136 mg/dL — ABNORMAL HIGH (ref 70–99)
Potassium: 3.1 mmol/L — ABNORMAL LOW (ref 3.5–5.1)
Sodium: 137 mmol/L (ref 135–145)

## 2018-07-16 LAB — CULTURE, RESPIRATORY W GRAM STAIN: Culture: NORMAL

## 2018-07-16 LAB — TRIGLYCERIDES: Triglycerides: 132 mg/dL (ref <150)

## 2018-07-16 LAB — VANCOMYCIN, TROUGH: Vancomycin Tr: 8 ug/mL — ABNORMAL LOW (ref 15–20)

## 2018-07-16 NOTE — Progress Notes (Addendum)
Pulmonary Critical Care Medicine [facility] [place]   PULMONARY CRITICAL CARE SERVICE  PROGRESS NOTE  Date of Service: 07/16/2018  [name]  GUY:[id number]  DOB: 06-20-1955   DOA: 07/02/2018  Referring Physician: Carron Curie, MD  HPI: [name] [name] is a 63 y.o. male seen for follow up of Acute on Chronic Respiratory Failure.  Patient had a 12-hour goal today on pressure support however he was only to manage 1 hour until his heart rate climbed into the 150s.  He is on full support at this time with an FiO2 35%.  Medications: Reviewed on Rounds  Physical Exam:  Vitals: Pulse 104 respirations 25 BP 91/59 O2 sat 98% temp 97.2  Ventilator Settings ventilator mode AC VC rate of 25 tidal volume 440 PEEP of 5 FiO2 30%  . General: Comfortable at this time . Eyes: Grossly normal lids, irises & conjunctiva . ENT: grossly tongue is normal . Neck: no obvious mass . Cardiovascular: S1 S2 normal no gallop . Respiratory: Rales or rhonchi noted . Abdomen: soft . Skin: no rash seen on limited exam . Musculoskeletal: not rigid . Psychiatric:unable to assess . Neurologic: no seizure no involuntary movements         Lab Data:   Basic Metabolic Panel: Recent Labs  Lab 07/10/18 0224 07/11/18 0526 07/12/18 0513 07/13/18 0820 07/13/18 1822 07/14/18 0548 07/15/18 0535 07/16/18 0538  NA 132* 136 136 136 133* 134*  --  137  K 3.5 3.6 3.7 3.4* 4.6 3.2* 3.3* 3.1*  CL 102 105 103 105 100 105  --  106  CO2 21* 21* 21* 21* 21* 19*  --  21*  GLUCOSE 138* 116* 132* 143* 217* 152*  --  136*  BUN 17 16 17 17 22 22   --  18  CREATININE 0.89 0.87 0.85 0.91 1.20 0.92  --  0.84  CALCIUM 8.3* 8.7* 8.7* 9.0 9.3 7.9*  --  8.9  MG 2.1 2.2 2.2  --  2.3 2.2  --   --   PHOS  --  4.3 5.0*  --  4.8* 3.4  --   --     ABG: Recent Labs  Lab 07/13/18 2205  PHART 7.429  PCO2ART 32.2  PO2ART 93.3  HCO3 20.9  O2SAT 97.2    Liver Function Tests: Recent Labs  Lab 07/11/18 0526  07/13/18 1822 07/14/18 0548  ALBUMIN 2.8* 3.4* 2.7*   No results for input(s): LIPASE, AMYLASE in the last 168 hours. No results for input(s): AMMONIA in the last 168 hours.  CBC: Recent Labs  Lab 07/11/18 0526 07/12/18 0513 07/13/18 1822 07/14/18 0548  WBC 10.3 10.8* 18.5* 14.8*  HGB 13.7 13.2 15.1 13.6  HCT 44.4 42.1 47.3 42.4  MCV 92.9 92.1 91.8 91.4  PLT 232 249 373 287    Cardiac Enzymes: No results for input(s): CKTOTAL, CKMB, CKMBINDEX, TROPONINI in the last 168 hours.  BNP (last 3 results) No results for input(s): BNP in the last 8760 hours.  ProBNP (last 3 results) No results for input(s): PROBNP in the last 8760 hours.  Radiological Exams: Ct Abdomen Wo Contrast  Result Date: 07/16/2018 CLINICAL DATA:  63 year old male undergoing evaluation for suitability of gastrostomy tube placement. EXAM: CT ABDOMEN WITHOUT CONTRAST TECHNIQUE: Multidetector CT imaging of the abdomen was performed following the standard protocol without IV contrast. COMPARISON:  Prior CT scan of the chest, abdomen and pelvis 06/30/2018 FINDINGS: Lower chest: Small bilateral pleural effusions with associated bibasilar atelectasis. Visualized heart is normal in  size. Calcifications visualized in the coronary arteries. No pericardial effusion. Unremarkable distal thoracic esophagus which conveys a gastric tube into the stomach. Hepatobiliary: Normal hepatic contour and morphology. No discrete hepatic lesions. Normal appearance of the gallbladder. No intra or extrahepatic biliary ductal dilatation. Pancreas: Unremarkable. No pancreatic ductal dilatation or surrounding inflammatory changes. Spleen: Normal in size without focal abnormality. Adrenals/Urinary Tract: Normal adrenal glands. No evidence of hydronephrosis. Punctate 2-3 mm nonobstructing stone in the lower pole collecting system of the right kidney. The visualized ureters are unremarkable. Stomach/Bowel: Normal gastric anatomy. No evidence of  colonic interposition or bowel obstruction. Vascular/Lymphatic: Atherosclerotic calcifications throughout the abdominal aorta. No suspicious lymphadenopathy. Other: No abdominal ascites or abdominal wall hernia. Musculoskeletal: No acute fracture or aggressive appearing lytic or blastic osseous lesion. IMPRESSION: 1. Patient's anatomy is suitable for percutaneous gastrostomy tube placement. 2. Nonobstructing right lower pole nephrolithiasis. 3. Small bilateral pleural effusions and associated atelectasis. 4. Coronary artery calcifications. Electronically Signed   By: Malachy Moan M.D.   On: 07/16/2018 16:36   Dg Abd Portable 1v  Result Date: 07/15/2018 CLINICAL DATA:  Nasogastric tube placement. EXAM: PORTABLE ABDOMEN - 1 VIEW COMPARISON:  Radiograph of July 08, 2018. FINDINGS: The bowel gas pattern is normal. Distal tip of nasogastric tube is seen in proximal stomach. No radio-opaque calculi or other significant radiographic abnormality are seen. IMPRESSION: Distal tip of nasogastric tube is seen in proximal stomach. No evidence of bowel obstruction or ileus. Electronically Signed   By: Lupita Raider, M.D.   On: 07/15/2018 10:55    Assessment/Plan Active Problems:   Acute on chronic respiratory failure with hypoxia (HCC)   COPD, severe (HCC)   Chronic atrial fibrillation   Acute systolic heart failure (HCC)   Lobar pneumonia, unspecified organism (HCC)   1. Acute on chronic respiratory failure with hypoxia continue with ventilator support at this time and progress to weaning again as soon as possible.  Continue to monitor trach site. 2. Severe COPD at baseline continue supportive care 3. Chronic atrial fibrillation rate controlled 4. Acute systolic heart failure radiological studies have been reviewed continue diuretics as tolerated 5. Lobar pneumonia treated continue present management   I have personally seen and evaluated the patient, evaluated laboratory and imaging results,  formulated the assessment and plan and placed orders. The Patient requires high complexity decision making for assessment and support.  Case was discussed on Rounds with the Respiratory Therapy Staff  Yevonne Pax, MD [facility] Pulmonary Critical Care Medicine Sleep Medicine

## 2018-07-17 DIAGNOSIS — I482 Chronic atrial fibrillation, unspecified: Secondary | ICD-10-CM | POA: Diagnosis not present

## 2018-07-17 DIAGNOSIS — I5021 Acute systolic (congestive) heart failure: Secondary | ICD-10-CM | POA: Diagnosis not present

## 2018-07-17 DIAGNOSIS — J9621 Acute and chronic respiratory failure with hypoxia: Secondary | ICD-10-CM | POA: Diagnosis not present

## 2018-07-17 DIAGNOSIS — J449 Chronic obstructive pulmonary disease, unspecified: Secondary | ICD-10-CM | POA: Diagnosis not present

## 2018-07-17 LAB — URINE CULTURE: Culture: 20000 — AB

## 2018-07-17 LAB — CBC
HEMATOCRIT: 46.2 % (ref 39.0–52.0)
Hemoglobin: 14.4 g/dL (ref 13.0–17.0)
MCH: 28.5 pg (ref 26.0–34.0)
MCHC: 31.2 g/dL (ref 30.0–36.0)
MCV: 91.3 fL (ref 80.0–100.0)
Platelets: 278 10*3/uL (ref 150–400)
RBC: 5.06 MIL/uL (ref 4.22–5.81)
RDW: 14.8 % (ref 11.5–15.5)
WBC: 12.1 10*3/uL — AB (ref 4.0–10.5)
nRBC: 0 % (ref 0.0–0.2)

## 2018-07-17 LAB — POTASSIUM: Potassium: 3.4 mmol/L — ABNORMAL LOW (ref 3.5–5.1)

## 2018-07-17 NOTE — Progress Notes (Signed)
Pulmonary Critical Care Medicine [facility] [place]   PULMONARY CRITICAL CARE SERVICE  PROGRESS NOTE  Date of Service: 07/17/2018  [name] [name]  LXB:[id number]  DOB: 1955/08/07   DOA: 07/02/2018  Referring Physician: Carron Curie, MD  HPI: [name] [name] is a 63 y.o. male seen for follow up of Acute on Chronic Respiratory Failure.  Patient was on a wean right now on pressure support mode on 30% FiO2  Medications: Reviewed on Rounds  Physical Exam:  Vitals: Temperature 98.8 pulse 117 respiratory 28 blood pressure 103/58 saturations 98%  Ventilator Settings currently on pressure support FiO2 is 30% tidal volume 505 pressure support 12 PEEP 5  . General: Comfortable at this time . Eyes: Grossly normal lids, irises & conjunctiva . ENT: grossly tongue is normal . Neck: no obvious mass . Cardiovascular: S1 S2 normal no gallop . Respiratory: No rhonchi or rales are noted at this time . Abdomen: soft . Skin: no rash seen on limited exam . Musculoskeletal: not rigid . Psychiatric:unable to assess . Neurologic: no seizure no involuntary movements         Lab Data:   Basic Metabolic Panel: Recent Labs  Lab 07/11/18 0526 07/12/18 0513 07/13/18 0820 07/13/18 1822 07/14/18 0548 07/15/18 0535 07/16/18 0538 07/17/18 0641  NA 136 136 136 133* 134*  --  137  --   K 3.6 3.7 3.4* 4.6 3.2* 3.3* 3.1* 3.4*  CL 105 103 105 100 105  --  106  --   CO2 21* 21* 21* 21* 19*  --  21*  --   GLUCOSE 116* 132* 143* 217* 152*  --  136*  --   BUN 16 17 17 22 22   --  18  --   CREATININE 0.87 0.85 0.91 1.20 0.92  --  0.84  --   CALCIUM 8.7* 8.7* 9.0 9.3 7.9*  --  8.9  --   MG 2.2 2.2  --  2.3 2.2  --   --   --   PHOS 4.3 5.0*  --  4.8* 3.4  --   --   --     ABG: Recent Labs  Lab 07/13/18 2205  PHART 7.429  PCO2ART 32.2  PO2ART 93.3  HCO3 20.9  O2SAT 97.2    Liver Function Tests: Recent Labs  Lab 07/11/18 0526 07/13/18 1822 07/14/18 0548  ALBUMIN 2.8* 3.4* 2.7*    No results for input(s): LIPASE, AMYLASE in the last 168 hours. No results for input(s): AMMONIA in the last 168 hours.  CBC: Recent Labs  Lab 07/11/18 0526 07/12/18 0513 07/13/18 1822 07/14/18 0548 07/17/18 0641  WBC 10.3 10.8* 18.5* 14.8* 12.1*  HGB 13.7 13.2 15.1 13.6 14.4  HCT 44.4 42.1 47.3 42.4 46.2  MCV 92.9 92.1 91.8 91.4 91.3  PLT 232 249 373 287 278    Cardiac Enzymes: No results for input(s): CKTOTAL, CKMB, CKMBINDEX, TROPONINI in the last 168 hours.  BNP (last 3 results) No results for input(s): BNP in the last 8760 hours.  ProBNP (last 3 results) No results for input(s): PROBNP in the last 8760 hours.  Radiological Exams: Ct Abdomen Wo Contrast  Result Date: 07/16/2018 CLINICAL DATA:  63 year old male undergoing evaluation for suitability of gastrostomy tube placement. EXAM: CT ABDOMEN WITHOUT CONTRAST TECHNIQUE: Multidetector CT imaging of the abdomen was performed following the standard protocol without IV contrast. COMPARISON:  Prior CT scan of the chest, abdomen and pelvis 06/30/2018 FINDINGS: Lower chest: Small bilateral pleural effusions with associated bibasilar atelectasis. Visualized  heart is normal in size. Calcifications visualized in the coronary arteries. No pericardial effusion. Unremarkable distal thoracic esophagus which conveys a gastric tube into the stomach. Hepatobiliary: Normal hepatic contour and morphology. No discrete hepatic lesions. Normal appearance of the gallbladder. No intra or extrahepatic biliary ductal dilatation. Pancreas: Unremarkable. No pancreatic ductal dilatation or surrounding inflammatory changes. Spleen: Normal in size without focal abnormality. Adrenals/Urinary Tract: Normal adrenal glands. No evidence of hydronephrosis. Punctate 2-3 mm nonobstructing stone in the lower pole collecting system of the right kidney. The visualized ureters are unremarkable. Stomach/Bowel: Normal gastric anatomy. No evidence of colonic interposition  or bowel obstruction. Vascular/Lymphatic: Atherosclerotic calcifications throughout the abdominal aorta. No suspicious lymphadenopathy. Other: No abdominal ascites or abdominal wall hernia. Musculoskeletal: No acute fracture or aggressive appearing lytic or blastic osseous lesion. IMPRESSION: 1. Patient's anatomy is suitable for percutaneous gastrostomy tube placement. 2. Nonobstructing right lower pole nephrolithiasis. 3. Small bilateral pleural effusions and associated atelectasis. 4. Coronary artery calcifications. Electronically Signed   By: Malachy Moan M.D.   On: 07/16/2018 16:36    Assessment/Plan Active Problems:   Acute on chronic respiratory failure with hypoxia (HCC)   COPD, severe (HCC)   Chronic atrial fibrillation   Acute systolic heart failure (HCC)   Lobar pneumonia, unspecified organism (HCC)   1. Acute on chronic respiratory failure with hypoxia we will continue with pressure support mode titrate oxygen continue pulmonary toilet 2. Severe COPD advanced disease we will continue with present management 3. Chronic atrial fibrillation rate is controlled we will continue to monitor 4. Acute systolic heart failure patient is at baseline 5. Lobar pneumonia treated continue with supportive care   I have personally seen and evaluated the patient, evaluated laboratory and imaging results, formulated the assessment and plan and placed orders. The Patient requires high complexity decision making for assessment and support.  Case was discussed on Rounds with the Respiratory Therapy Staff  Yevonne Pax, MD [facility] Pulmonary Critical Care Medicine Sleep Medicine

## 2018-07-18 DIAGNOSIS — I5021 Acute systolic (congestive) heart failure: Secondary | ICD-10-CM | POA: Diagnosis not present

## 2018-07-18 DIAGNOSIS — I482 Chronic atrial fibrillation, unspecified: Secondary | ICD-10-CM | POA: Diagnosis not present

## 2018-07-18 DIAGNOSIS — J449 Chronic obstructive pulmonary disease, unspecified: Secondary | ICD-10-CM | POA: Diagnosis not present

## 2018-07-18 DIAGNOSIS — J9621 Acute and chronic respiratory failure with hypoxia: Secondary | ICD-10-CM | POA: Diagnosis not present

## 2018-07-18 LAB — VANCOMYCIN, TROUGH: Vancomycin Tr: 14 ug/mL — ABNORMAL LOW (ref 15–20)

## 2018-07-18 LAB — BASIC METABOLIC PANEL
Anion gap: 12 (ref 5–15)
BUN: 16 mg/dL (ref 8–23)
CO2: 21 mmol/L — ABNORMAL LOW (ref 22–32)
Calcium: 9.2 mg/dL (ref 8.9–10.3)
Chloride: 105 mmol/L (ref 98–111)
Creatinine, Ser: 0.84 mg/dL (ref 0.61–1.24)
GFR calc Af Amer: >60 mL/min (ref >60)
GFR calc non Af Amer: >60 mL/min (ref >60)
Glucose, Bld: 141 mg/dL — ABNORMAL HIGH (ref 70–99)
POTASSIUM: 3.2 mmol/L — AB (ref 3.5–5.1)
SODIUM: 138 mmol/L (ref 135–145)

## 2018-07-18 NOTE — Consult Note (Signed)
Ref: Sharlene Dory, DO   Subjective:  Patient with fever, bacteremia, h/o endocarditis has tachycardia. He had tracheostomy on 07/15/2018.   Objective:  Vital Signs in the last 24 hours:    Physical Exam: BP Readings from Last 1 Encounters:  12/19/16 114/70     Wt Readings from Last 1 Encounters:  05/17/15 78.2 kg    Weight change:  There is no height or weight on file to calculate BMI. HEENT: [place]/AT, Eyes- Conjunctiva-Pink, Sclera-Non-icteric Neck: No JVD, No bruit, Trachea midline. Lungs:  Clearing, Bilateral. Cardiac:  Irregular rhythm, normal S1 and S2, no S3. II/VI systolic murmur. Abdomen:  Soft, non-tender. BS present. Extremities:  No edema present. No cyanosis. No clubbing. Bilateral AKA CNS: AxOx3, Cranial nerves grossly intact.  Skin: Warm and dry.   Intake/Output from previous day: No intake/output data recorded.    Lab Results: BMET    Component Value Date/Time   NA 138 07/18/2018 0841   NA 137 07/16/2018 0538   NA 134 (L) 07/14/2018 0548   K 3.2 (L) 07/18/2018 0841   K 3.4 (L) 07/17/2018 0641   K 3.1 (L) 07/16/2018 0538   CL 105 07/18/2018 0841   CL 106 07/16/2018 0538   CL 105 07/14/2018 0548   CO2 21 (L) 07/18/2018 0841   CO2 21 (L) 07/16/2018 0538   CO2 19 (L) 07/14/2018 0548   GLUCOSE 141 (H) 07/18/2018 0841   GLUCOSE 136 (H) 07/16/2018 0538   GLUCOSE 152 (H) 07/14/2018 0548   BUN 16 07/18/2018 0841   BUN 18 07/16/2018 0538   BUN 22 07/14/2018 0548   CREATININE 0.84 07/18/2018 0841   CREATININE 0.84 07/16/2018 0538   CREATININE 0.92 07/14/2018 0548   CALCIUM 9.2 07/18/2018 0841   CALCIUM 8.9 07/16/2018 0538   CALCIUM 7.9 (L) 07/14/2018 0548   GFRNONAA >60 07/18/2018 0841   GFRNONAA >60 07/16/2018 0538   GFRNONAA >60 07/14/2018 0548   GFRAA >60 07/18/2018 0841   GFRAA >60 07/16/2018 0538   GFRAA >60 07/14/2018 0548   CBC    Component Value Date/Time   WBC 12.1 (H) 07/17/2018 0641   RBC 5.06 07/17/2018 0641   HGB 14.4  07/17/2018 0641   HCT 46.2 07/17/2018 0641   HCT 43.3 05/13/2015 1945   PLT 278 07/17/2018 0641   MCV 91.3 07/17/2018 0641   MCH 28.5 07/17/2018 0641   MCHC 31.2 07/17/2018 0641   RDW 14.8 07/17/2018 0641   LYMPHSABS 1.9 07/02/2018 2251   MONOABS 1.3 (H) 07/02/2018 2251   EOSABS 0.0 07/02/2018 2251   BASOSABS 0.0 07/02/2018 2251   HEPATIC Function Panel No results for input(s): PROT in the last 8760 hours.  Invalid input(s):  ALBUMIN,  AST,  ALT,  ALKPHOS,  BILIDIR,  IBILI HEMOGLOBIN A1C No components found for: HGA1C,  MPG CARDIAC ENZYMES Lab Results  Component Value Date   CKTOTAL 63 07/06/2018   CKMB 2.1 07/06/2018   TROPONINI 0.04 (HH) 07/06/2018   TROPONINI 0.04 (HH) 07/06/2018   TROPONINI 0.04 (HH) 07/06/2018   BNP No results for input(s): PROBNP in the last 8760 hours. TSH Recent Labs    07/13/18 1822  TSH 1.527   CHOLESTEROL No results for input(s): CHOL in the last 8760 hours.  Scheduled Meds: Continuous Infusions: PRN Meds:.  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 May increase metoprolol. Resume low dose digoxin Continue amiodarone and diltiazem TEE in 2 weeks.   LOS: 0 days    Orpah Cobb  MD  07/18/2018, 11:42 AM

## 2018-07-18 NOTE — Progress Notes (Signed)
Pulmonary Critical Care Medicine [facility] [place]   PULMONARY CRITICAL CARE SERVICE  PROGRESS NOTE  Date of Service: 07/18/2018  [name] [name]  OQH:[id number]  DOB: 1955-07-03   DOA: 07/02/2018  Referring Physician: Carron Curie, MD  HPI: [name] [name] is a 63 y.o. male seen for follow up of Acute on Chronic Respiratory Failure.  No fever noted today patient remains on pressure support the goal is for 4-hour wean  Medications: Reviewed on Rounds  Physical Exam:  Vitals: Temperature 98.4 pulse 102 respiratory 25 blood pressure 104/72 saturation 94%  Ventilator Settings mode ventilation pressure support FiO2 28% tidal volume is 400 pressure 12 PEEP 5  . General: Comfortable at this time . Eyes: Grossly normal lids, irises & conjunctiva . ENT: grossly tongue is normal . Neck: no obvious mass . Cardiovascular: S1 S2 normal no gallop . Respiratory: No rhonchi or rales are noted . Abdomen: soft . Skin: no rash seen on limited exam . Musculoskeletal: not rigid . Psychiatric:unable to assess . Neurologic: no seizure no involuntary movements         Lab Data:   Basic Metabolic Panel: Recent Labs  Lab 07/12/18 0513 07/13/18 0820 07/13/18 1822 07/14/18 0548 07/15/18 0535 07/16/18 0538 07/17/18 0641 07/18/18 0841  NA 136 136 133* 134*  --  137  --  138  K 3.7 3.4* 4.6 3.2* 3.3* 3.1* 3.4* 3.2*  CL 103 105 100 105  --  106  --  105  CO2 21* 21* 21* 19*  --  21*  --  21*  GLUCOSE 132* 143* 217* 152*  --  136*  --  141*  BUN 17 17 22 22   --  18  --  16  CREATININE 0.85 0.91 1.20 0.92  --  0.84  --  0.84  CALCIUM 8.7* 9.0 9.3 7.9*  --  8.9  --  9.2  MG 2.2  --  2.3 2.2  --   --   --   --   PHOS 5.0*  --  4.8* 3.4  --   --   --   --     ABG: Recent Labs  Lab 07/13/18 2205  PHART 7.429  PCO2ART 32.2  PO2ART 93.3  HCO3 20.9  O2SAT 97.2    Liver Function Tests: Recent Labs  Lab 07/13/18 1822 07/14/18 0548  ALBUMIN 3.4* 2.7*   No results for  input(s): LIPASE, AMYLASE in the last 168 hours. No results for input(s): AMMONIA in the last 168 hours.  CBC: Recent Labs  Lab 07/12/18 0513 07/13/18 1822 07/14/18 0548 07/17/18 0641  WBC 10.8* 18.5* 14.8* 12.1*  HGB 13.2 15.1 13.6 14.4  HCT 42.1 47.3 42.4 46.2  MCV 92.1 91.8 91.4 91.3  PLT 249 373 287 278    Cardiac Enzymes: No results for input(s): CKTOTAL, CKMB, CKMBINDEX, TROPONINI in the last 168 hours.  BNP (last 3 results) No results for input(s): BNP in the last 8760 hours.  ProBNP (last 3 results) No results for input(s): PROBNP in the last 8760 hours.  Radiological Exams: Ct Abdomen Wo Contrast  Result Date: 07/16/2018 CLINICAL DATA:  63 year old male undergoing evaluation for suitability of gastrostomy tube placement. EXAM: CT ABDOMEN WITHOUT CONTRAST TECHNIQUE: Multidetector CT imaging of the abdomen was performed following the standard protocol without IV contrast. COMPARISON:  Prior CT scan of the chest, abdomen and pelvis 06/30/2018 FINDINGS: Lower chest: Small bilateral pleural effusions with associated bibasilar atelectasis. Visualized heart is normal in size. Calcifications visualized in the  coronary arteries. No pericardial effusion. Unremarkable distal thoracic esophagus which conveys a gastric tube into the stomach. Hepatobiliary: Normal hepatic contour and morphology. No discrete hepatic lesions. Normal appearance of the gallbladder. No intra or extrahepatic biliary ductal dilatation. Pancreas: Unremarkable. No pancreatic ductal dilatation or surrounding inflammatory changes. Spleen: Normal in size without focal abnormality. Adrenals/Urinary Tract: Normal adrenal glands. No evidence of hydronephrosis. Punctate 2-3 mm nonobstructing stone in the lower pole collecting system of the right kidney. The visualized ureters are unremarkable. Stomach/Bowel: Normal gastric anatomy. No evidence of colonic interposition or bowel obstruction. Vascular/Lymphatic: Atherosclerotic  calcifications throughout the abdominal aorta. No suspicious lymphadenopathy. Other: No abdominal ascites or abdominal wall hernia. Musculoskeletal: No acute fracture or aggressive appearing lytic or blastic osseous lesion. IMPRESSION: 1. Patient's anatomy is suitable for percutaneous gastrostomy tube placement. 2. Nonobstructing right lower pole nephrolithiasis. 3. Small bilateral pleural effusions and associated atelectasis. 4. Coronary artery calcifications. Electronically Signed   By: Malachy Moan M.D.   On: 07/16/2018 16:36    Assessment/Plan Active Problems:   Acute on chronic respiratory failure with hypoxia (HCC)   COPD, severe (HCC)   Chronic atrial fibrillation   Acute systolic heart failure (HCC)   Lobar pneumonia, unspecified organism (HCC)   1. Acute on chronic respiratory failure with hypoxia we will continue with pressure support mode titrate oxygen continue pulmonary toilet the goal today is for 4 hours 2. Severe COPD at baseline continue present management 3. Chronic atrial fibrillation rate controlled 4. Acute systolic heart failure at baseline 5. Lobar pneumonia treated   I have personally seen and evaluated the patient, evaluated laboratory and imaging results, formulated the assessment and plan and placed orders. The Patient requires high complexity decision making for assessment and support.  Case was discussed on Rounds with the Respiratory Therapy Staff  Yevonne Pax, MD [facility] Pulmonary Critical Care Medicine Sleep Medicine

## 2018-07-19 ENCOUNTER — Other Ambulatory Visit (HOSPITAL_COMMUNITY): Payer: Medicare Other

## 2018-07-19 DIAGNOSIS — J9621 Acute and chronic respiratory failure with hypoxia: Secondary | ICD-10-CM | POA: Diagnosis not present

## 2018-07-19 DIAGNOSIS — I5021 Acute systolic (congestive) heart failure: Secondary | ICD-10-CM | POA: Diagnosis not present

## 2018-07-19 DIAGNOSIS — I482 Chronic atrial fibrillation, unspecified: Secondary | ICD-10-CM | POA: Diagnosis not present

## 2018-07-19 DIAGNOSIS — J449 Chronic obstructive pulmonary disease, unspecified: Secondary | ICD-10-CM | POA: Diagnosis not present

## 2018-07-19 LAB — BASIC METABOLIC PANEL
Anion gap: 10 (ref 5–15)
BUN: 18 mg/dL (ref 8–23)
CO2: 22 mmol/L (ref 22–32)
CREATININE: 0.75 mg/dL (ref 0.61–1.24)
Calcium: 9.7 mg/dL (ref 8.9–10.3)
Chloride: 105 mmol/L (ref 98–111)
GFR calc Af Amer: >60 mL/min (ref >60)
GFR calc non Af Amer: >60 mL/min (ref >60)
Glucose, Bld: 147 mg/dL — ABNORMAL HIGH (ref 70–99)
Potassium: 3.2 mmol/L — ABNORMAL LOW (ref 3.5–5.1)
Sodium: 137 mmol/L (ref 135–145)

## 2018-07-19 LAB — TRIGLYCERIDES: Triglycerides: 75 mg/dL (ref <150)

## 2018-07-19 LAB — MAGNESIUM: Magnesium: 2.2 mg/dL (ref 1.7–2.4)

## 2018-07-19 LAB — PROTIME-INR
INR: 1.1 (ref 0.8–1.2)
PROTHROMBIN TIME: 13.8 s (ref 11.4–15.2)

## 2018-07-19 LAB — PHOSPHORUS: Phosphorus: 4.3 mg/dL (ref 2.5–4.6)

## 2018-07-19 NOTE — Progress Notes (Addendum)
Pulmonary Critical Care Medicine [facility] [place]   PULMONARY CRITICAL CARE SERVICE  PROGRESS NOTE  Date of Service: 07/19/2018  [name] [name]  KNL:[id number]  DOB: August 18, 1955   DOA: 07/02/2018  Referring Physician: Carron Curie, MD  HPI: [name] [name] is a 63 y.o. male seen for follow up of Acute on Chronic Respiratory Failure.  Patient continues to do well at this time has a goal today of 12 hours on pressure support wean.   Medications: Reviewed on Rounds  Physical Exam:  Vitals: Pulse 105 respirations 32 BP 96/87 O2 sat 92% temp 98.1  Ventilator Settings FiO2 28% tidal volume 400 pressure 12 PEEP 5  . General: Comfortable at this time . Eyes: Grossly normal lids, irises & conjunctiva . ENT: grossly tongue is normal . Neck: no obvious mass . Cardiovascular: S1 S2 normal no gallop . Respiratory: No rhonchi rales noted . Abdomen: soft . Skin: no rash seen on limited exam . Musculoskeletal: not rigid . Psychiatric:unable to assess . Neurologic: no seizure no involuntary movements         Lab Data:   Basic Metabolic Panel: Recent Labs  Lab 07/13/18 1822 07/14/18 0548 07/15/18 0535 07/16/18 0538 07/17/18 0641 07/18/18 0841 07/19/18 0450  NA 133* 134*  --  137  --  138 137  K 4.6 3.2* 3.3* 3.1* 3.4* 3.2* 3.2*  CL 100 105  --  106  --  105 105  CO2 21* 19*  --  21*  --  21* 22  GLUCOSE 217* 152*  --  136*  --  141* 147*  BUN 22 22  --  18  --  16 18  CREATININE 1.20 0.92  --  0.84  --  0.84 0.75  CALCIUM 9.3 7.9*  --  8.9  --  9.2 9.7  MG 2.3 2.2  --   --   --   --  2.2  PHOS 4.8* 3.4  --   --   --   --  4.3    ABG: Recent Labs  Lab 07/13/18 2205  PHART 7.429  PCO2ART 32.2  PO2ART 93.3  HCO3 20.9  O2SAT 97.2    Liver Function Tests: Recent Labs  Lab 07/13/18 1822 07/14/18 0548  ALBUMIN 3.4* 2.7*   No results for input(s): LIPASE, AMYLASE in the last 168 hours. No results for input(s): AMMONIA in the last 168  hours.  CBC: Recent Labs  Lab 07/13/18 1822 07/14/18 0548 07/17/18 0641  WBC 18.5* 14.8* 12.1*  HGB 15.1 13.6 14.4  HCT 47.3 42.4 46.2  MCV 91.8 91.4 91.3  PLT 373 287 278    Cardiac Enzymes: No results for input(s): CKTOTAL, CKMB, CKMBINDEX, TROPONINI in the last 168 hours.  BNP (last 3 results) No results for input(s): BNP in the last 8760 hours.  ProBNP (last 3 results) No results for input(s): PROBNP in the last 8760 hours.  Radiological Exams: Dg Chest Port 1 View  Result Date: 07/19/2018 CLINICAL DATA:  63 year old male with congestive heart failure EXAM: PORTABLE CHEST 1 VIEW COMPARISON:  Prior chest x-ray 07/14/2018 FINDINGS: The patient has been extubated and a tracheostomy tube placed. The tracheostomy tube is in good position midline and at the level of the clavicles. A gastric tube is present. The tip lies off the field of view, presumably within the stomach. Cardiomegaly remains stable as do the mediastinal contours. The patient's left IJ central venous catheter has been removed. Atherosclerotic calcifications again noted in the transverse aorta. Similar degree  of pulmonary vascular congestion without overt edema. Slightly improved right basilar atelectasis. Likely persistent small bilateral layering pleural effusions and associated atelectasis. IMPRESSION: 1. Interval tracheostomy.  The tracheostomy tube is well positioned. 2. The left IJ central venous catheter has been removed. 3. A gastric tube remains in place, the tip lies off the field of view but presumably in the stomach. 4. Stable cardiomegaly with pulmonary vascular congestion but no overt edema. 5. Small bilateral layering pleural effusions and associated atelectasis. Electronically Signed   By: Malachy Moan M.D.   On: 07/19/2018 08:26    Assessment/Plan Active Problems:   Acute on chronic respiratory failure with hypoxia (HCC)   COPD, severe (HCC)   Chronic atrial fibrillation   Acute systolic heart  failure (HCC)   Lobar pneumonia, unspecified organism (HCC)   1. Acute on chronic respiratory failure with hypoxia continue with pressure support mode and titrate oxygen as tolerated.  Continue aggressive pulmonary toilet.  Goal is 12 hours today on pressure support. 2. Severe COPD at baseline continue present management 3. Chronic atrial fibrillation rate controlled 4. Acute systolic heart failure at baseline 5. Lobar pneumonia treated   I have personally seen and evaluated the patient, evaluated laboratory and imaging results, formulated the assessment and plan and placed orders. The Patient requires high complexity decision making for assessment and support.  Case was discussed on Rounds with the Respiratory Therapy Staff  Yevonne Pax, MD [facility] Pulmonary Critical Care Medicine Sleep Medicine
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
## 2018-07-20 DIAGNOSIS — J449 Chronic obstructive pulmonary disease, unspecified: Secondary | ICD-10-CM | POA: Diagnosis not present

## 2018-07-20 DIAGNOSIS — J9621 Acute and chronic respiratory failure with hypoxia: Secondary | ICD-10-CM | POA: Diagnosis not present

## 2018-07-20 DIAGNOSIS — I482 Chronic atrial fibrillation, unspecified: Secondary | ICD-10-CM | POA: Diagnosis not present

## 2018-07-20 DIAGNOSIS — I5021 Acute systolic (congestive) heart failure: Secondary | ICD-10-CM | POA: Diagnosis not present

## 2018-07-20 LAB — POTASSIUM: Potassium: 3.8 mmol/L (ref 3.5–5.1)

## 2018-07-20 LAB — VANCOMYCIN, TROUGH
VANCOMYCIN TR: 20 ug/mL (ref 15–20)
Vancomycin Tr: 25 ug/mL (ref 15–20)

## 2018-07-20 NOTE — Progress Notes (Addendum)
Pulmonary Critical Care Medicine [facility] [place]   PULMONARY CRITICAL CARE SERVICE  PROGRESS NOTE  Date of Service: 07/20/2018  [name] [name]  GYI:[id number]  DOB: 25-Jan-1956   DOA: 07/02/2018  Referring Physician: Carron Curie, MD  HPI: [name] [name] is a 63 y.o. male seen for follow up of Acute on Chronic Respiratory Failure.  Patient was unable to tolerate 12-hour PSV yesterday.  His goal today is also 12 hours.  Patient remains tachypneic at baseline with a copious amount of secretions.  We will add orders for a anticholinergic medication to help dry up secretions.  Medications: Reviewed on Rounds  Physical Exam:  Vitals: Pulse 91 respirations 25 BP 96/61 O2 sat 95% temp 97.1  Ventilator Settings patient is currently on pressure support 12/5 FiO2 28%  . General: Comfortable at this time . Eyes: Grossly normal lids, irises & conjunctiva . ENT: grossly tongue is normal . Neck: no obvious mass . Cardiovascular: S1 S2 normal no gallop . Respiratory: No rales or rhonchi noted . Abdomen: soft . Skin: no rash seen on limited exam . Musculoskeletal: not rigid . Psychiatric:unable to assess . Neurologic: no seizure no involuntary movements         Lab Data:   Basic Metabolic Panel: Recent Labs  Lab 07/13/18 1822 07/14/18 0548  07/16/18 0538 07/17/18 0641 07/18/18 0841 07/19/18 0450 07/20/18 0527  NA 133* 134*  --  137  --  138 137  --   K 4.6 3.2*   < > 3.1* 3.4* 3.2* 3.2* 3.8  CL 100 105  --  106  --  105 105  --   CO2 21* 19*  --  21*  --  21* 22  --   GLUCOSE 217* 152*  --  136*  --  141* 147*  --   BUN 22 22  --  18  --  16 18  --   CREATININE 1.20 0.92  --  0.84  --  0.84 0.75  --   CALCIUM 9.3 7.9*  --  8.9  --  9.2 9.7  --   MG 2.3 2.2  --   --   --   --  2.2  --   PHOS 4.8* 3.4  --   --   --   --  4.3  --    < > = values in this interval not displayed.    ABG: Recent Labs  Lab 07/13/18 2205  PHART 7.429  PCO2ART 32.2  PO2ART 93.3   HCO3 20.9  O2SAT 97.2    Liver Function Tests: Recent Labs  Lab 07/13/18 1822 07/14/18 0548  ALBUMIN 3.4* 2.7*   No results for input(s): LIPASE, AMYLASE in the last 168 hours. No results for input(s): AMMONIA in the last 168 hours.  CBC: Recent Labs  Lab 07/13/18 1822 07/14/18 0548 07/17/18 0641  WBC 18.5* 14.8* 12.1*  HGB 15.1 13.6 14.4  HCT 47.3 42.4 46.2  MCV 91.8 91.4 91.3  PLT 373 287 278    Cardiac Enzymes: No results for input(s): CKTOTAL, CKMB, CKMBINDEX, TROPONINI in the last 168 hours.  BNP (last 3 results) No results for input(s): BNP in the last 8760 hours.  ProBNP (last 3 results) No results for input(s): PROBNP in the last 8760 hours.  Radiological Exams: Dg Chest Port 1 View  Result Date: 07/19/2018 CLINICAL DATA:  63 year old male with congestive heart failure EXAM: PORTABLE CHEST 1 VIEW COMPARISON:  Prior chest x-ray 07/14/2018 FINDINGS: The patient has been  extubated and a tracheostomy tube placed. The tracheostomy tube is in good position midline and at the level of the clavicles. A gastric tube is present. The tip lies off the field of view, presumably within the stomach. Cardiomegaly remains stable as do the mediastinal contours. The patient's left IJ central venous catheter has been removed. Atherosclerotic calcifications again noted in the transverse aorta. Similar degree of pulmonary vascular congestion without overt edema. Slightly improved right basilar atelectasis. Likely persistent small bilateral layering pleural effusions and associated atelectasis. IMPRESSION: 1. Interval tracheostomy.  The tracheostomy tube is well positioned. 2. The left IJ central venous catheter has been removed. 3. A gastric tube remains in place, the tip lies off the field of view but presumably in the stomach. 4. Stable cardiomegaly with pulmonary vascular congestion but no overt edema. 5. Small bilateral layering pleural effusions and associated atelectasis.  Electronically Signed   By: Malachy Moan M.D.   On: 07/19/2018 08:26    Assessment/Plan Active Problems:   Acute on chronic respiratory failure with hypoxia (HCC)   COPD, severe (HCC)   Chronic atrial fibrillation   Acute systolic heart failure (HCC)   Lobar pneumonia, unspecified organism (HCC)   1. Acute on chronic respiratory failure with hypoxia continue with pressure support mode and titrate off as tolerated.  Adding anticholinergic medication to help with secretions.  Continue aggressive pulmonary toilet.  Try again today 12 hours on pressure support. 2. Severe COPD at baseline continue present management 3. Chronic atrial fibrillation rate controlled 4. Acute systolic heart failure at baseline 5. Lobar pneumonia treated   I have personally seen and evaluated the patient, evaluated laboratory and imaging results, formulated the assessment and plan and placed orders. The Patient requires high complexity decision making for assessment and support.  Case was discussed on Rounds with the Respiratory Therapy Staff  Yevonne Pax, MD [facility] Pulmonary Critical Care Medicine Sleep Medicine

## 2018-07-21 DIAGNOSIS — J9621 Acute and chronic respiratory failure with hypoxia: Secondary | ICD-10-CM | POA: Diagnosis not present

## 2018-07-21 DIAGNOSIS — I5021 Acute systolic (congestive) heart failure: Secondary | ICD-10-CM | POA: Diagnosis not present

## 2018-07-21 DIAGNOSIS — J449 Chronic obstructive pulmonary disease, unspecified: Secondary | ICD-10-CM | POA: Diagnosis not present

## 2018-07-21 DIAGNOSIS — I482 Chronic atrial fibrillation, unspecified: Secondary | ICD-10-CM | POA: Diagnosis not present

## 2018-07-21 LAB — COMPREHENSIVE METABOLIC PANEL
ALK PHOS: 104 U/L (ref 38–126)
ALT: 48 U/L — ABNORMAL HIGH (ref 0–44)
ANION GAP: 9 (ref 5–15)
AST: 47 U/L — ABNORMAL HIGH (ref 15–41)
Albumin: 2.9 g/dL — ABNORMAL LOW (ref 3.5–5.0)
BUN: 21 mg/dL (ref 8–23)
CHLORIDE: 105 mmol/L (ref 98–111)
CO2: 21 mmol/L — ABNORMAL LOW (ref 22–32)
Calcium: 9.3 mg/dL (ref 8.9–10.3)
Creatinine, Ser: 0.76 mg/dL (ref 0.61–1.24)
GFR calc Af Amer: >60 mL/min (ref >60)
GFR calc non Af Amer: >60 mL/min (ref >60)
Glucose, Bld: 150 mg/dL — ABNORMAL HIGH (ref 70–99)
Potassium: 3.2 mmol/L — ABNORMAL LOW (ref 3.5–5.1)
Sodium: 135 mmol/L (ref 135–145)
Total Bilirubin: 0.8 mg/dL (ref 0.3–1.2)
Total Protein: 7.4 g/dL (ref 6.5–8.1)

## 2018-07-21 LAB — CBC
HCT: 44.7 % (ref 39.0–52.0)
Hemoglobin: 14.8 g/dL (ref 13.0–17.0)
MCH: 29.5 pg (ref 26.0–34.0)
MCHC: 33.1 g/dL (ref 30.0–36.0)
MCV: 89.2 fL (ref 80.0–100.0)
Platelets: 389 10*3/uL (ref 150–400)
RBC: 5.01 MIL/uL (ref 4.22–5.81)
RDW: 14.6 % (ref 11.5–15.5)
WBC: 12.8 10*3/uL — ABNORMAL HIGH (ref 4.0–10.5)
nRBC: 0 % (ref 0.0–0.2)

## 2018-07-21 LAB — MAGNESIUM: Magnesium: 2.2 mg/dL (ref 1.7–2.4)

## 2018-07-21 NOTE — Progress Notes (Signed)
Pulmonary Critical Care Medicine [facility] [place]   PULMONARY CRITICAL CARE SERVICE  PROGRESS NOTE  Date of Service: 07/21/2018  [name] [name]  CNO:[id number]  DOB: 09/18/1955   DOA: 07/02/2018  Referring Physician: Carron Curie, MD  HPI: [name] [name] is a 63 y.o. male seen for follow up of Acute on Chronic Respiratory Failure.  Patient was weaning on pressure support the goal today was for 16 hours looks good so far  Medications: Reviewed on Rounds  Physical Exam:  Vitals: Temperature 98.6 pulse 117 respiratory rate 30 blood pressure 101/67 saturations 98%  Ventilator Settings mode ventilation pressure support FiO2 is 28% tidal volume 404 pressure support 12 PEEP 5  . General: Comfortable at this time . Eyes: Grossly normal lids, irises & conjunctiva . ENT: grossly tongue is normal . Neck: no obvious mass . Cardiovascular: S1 S2 normal no gallop . Respiratory: Scattered distant rhonchi are noted bilaterally . Abdomen: soft . Skin: no rash seen on limited exam . Musculoskeletal: not rigid . Psychiatric:unable to assess . Neurologic: no seizure no involuntary movements         Lab Data:   Basic Metabolic Panel: Recent Labs  Lab 07/16/18 0538 07/17/18 0641 07/18/18 0841 07/19/18 0450 07/20/18 0527 07/21/18 0705  NA 137  --  138 137  --  135  K 3.1* 3.4* 3.2* 3.2* 3.8 3.2*  CL 106  --  105 105  --  105  CO2 21*  --  21* 22  --  21*  GLUCOSE 136*  --  141* 147*  --  150*  BUN 18  --  16 18  --  21  CREATININE 0.84  --  0.84 0.75  --  0.76  CALCIUM 8.9  --  9.2 9.7  --  9.3  MG  --   --   --  2.2  --  2.2  PHOS  --   --   --  4.3  --   --     ABG: No results for input(s): PHART, PCO2ART, PO2ART, HCO3, O2SAT in the last 168 hours.  Liver Function Tests: Recent Labs  Lab 07/21/18 0705  AST 47*  ALT 48*  ALKPHOS 104  BILITOT 0.8  PROT 7.4  ALBUMIN 2.9*   No results for input(s): LIPASE, AMYLASE in the last 168 hours. No results for  input(s): AMMONIA in the last 168 hours.  CBC: Recent Labs  Lab 07/17/18 0641 07/21/18 0705  WBC 12.1* 12.8*  HGB 14.4 14.8  HCT 46.2 44.7  MCV 91.3 89.2  PLT 278 389    Cardiac Enzymes: No results for input(s): CKTOTAL, CKMB, CKMBINDEX, TROPONINI in the last 168 hours.  BNP (last 3 results) No results for input(s): BNP in the last 8760 hours.  ProBNP (last 3 results) No results for input(s): PROBNP in the last 8760 hours.  Radiological Exams: No results found.  Assessment/Plan Active Problems:   Acute on chronic respiratory failure with hypoxia (HCC)   COPD, severe (HCC)   Chronic atrial fibrillation   Acute systolic heart failure (HCC)   Lobar pneumonia, unspecified organism (HCC)   1. Acute on chronic respiratory failure with hypoxia we will continue with weaning on pressure support the goal is 16 hours 2. Severe COPD at baseline 3. Chronic atrial fibrillation rate controlled 4. Acute systolic heart failure compensated 5. Lobar pneumonia treated clinically improved   I have personally seen and evaluated the patient, evaluated laboratory and imaging results, formulated the assessment and plan and  placed orders. The Patient requires high complexity decision making for assessment and support.  Case was discussed on Rounds with the Respiratory Therapy Staff  Allyne Gee, MD [facility] Pulmonary Critical Care Medicine Sleep Medicine

## 2018-07-22 ENCOUNTER — Other Ambulatory Visit (HOSPITAL_COMMUNITY): Payer: Medicare Other

## 2018-07-22 DIAGNOSIS — I5021 Acute systolic (congestive) heart failure: Secondary | ICD-10-CM | POA: Diagnosis not present

## 2018-07-22 DIAGNOSIS — J449 Chronic obstructive pulmonary disease, unspecified: Secondary | ICD-10-CM | POA: Diagnosis not present

## 2018-07-22 DIAGNOSIS — I482 Chronic atrial fibrillation, unspecified: Secondary | ICD-10-CM | POA: Diagnosis not present

## 2018-07-22 DIAGNOSIS — J9621 Acute and chronic respiratory failure with hypoxia: Secondary | ICD-10-CM | POA: Diagnosis not present

## 2018-07-22 HISTORY — PX: IR GASTROSTOMY TUBE MOD SED: IMG625

## 2018-07-22 LAB — BASIC METABOLIC PANEL
Anion gap: 11 (ref 5–15)
BUN: 24 mg/dL — ABNORMAL HIGH (ref 8–23)
CHLORIDE: 103 mmol/L (ref 98–111)
CO2: 23 mmol/L (ref 22–32)
Calcium: 10.4 mg/dL — ABNORMAL HIGH (ref 8.9–10.3)
Creatinine, Ser: 0.85 mg/dL (ref 0.61–1.24)
GFR calc Af Amer: >60 mL/min (ref >60)
GFR calc non Af Amer: >60 mL/min (ref >60)
GLUCOSE: 130 mg/dL — AB (ref 70–99)
Potassium: 3 mmol/L — ABNORMAL LOW (ref 3.5–5.1)
Sodium: 137 mmol/L (ref 135–145)

## 2018-07-22 LAB — CBC
HCT: 44.7 % (ref 39.0–52.0)
Hemoglobin: 14.6 g/dL (ref 13.0–17.0)
MCH: 29.3 pg (ref 26.0–34.0)
MCHC: 32.7 g/dL (ref 30.0–36.0)
MCV: 89.6 fL (ref 80.0–100.0)
Platelets: 418 10*3/uL — ABNORMAL HIGH (ref 150–400)
RBC: 4.99 MIL/uL (ref 4.22–5.81)
RDW: 14.6 % (ref 11.5–15.5)
WBC: 12.7 10*3/uL — ABNORMAL HIGH (ref 4.0–10.5)
nRBC: 0 % (ref 0.0–0.2)

## 2018-07-22 LAB — TRIGLYCERIDES: Triglycerides: 107 mg/dL (ref <150)

## 2018-07-22 LAB — MAGNESIUM: Magnesium: 2.3 mg/dL (ref 1.7–2.4)

## 2018-07-22 LAB — PHOSPHORUS: Phosphorus: 5 mg/dL — ABNORMAL HIGH (ref 2.5–4.6)

## 2018-07-22 MED ORDER — VANCOMYCIN HCL IN DEXTROSE 1-5 GM/200ML-% IV SOLN
INTRAVENOUS | Status: AC
  Filled 2018-07-22: qty 200

## 2018-07-22 MED ORDER — FENTANYL CITRATE (PF) 100 MCG/2ML IJ SOLN
INTRAMUSCULAR | Status: AC
  Filled 2018-07-22: qty 2

## 2018-07-22 MED ORDER — MIDAZOLAM HCL 2 MG/2ML IJ SOLN
INTRAMUSCULAR | Status: AC | PRN
  Administered 2018-07-22 (×4): 1 mg via INTRAVENOUS

## 2018-07-22 MED ORDER — MIDAZOLAM HCL 2 MG/2ML IJ SOLN
INTRAMUSCULAR | Status: AC
  Filled 2018-07-22: qty 2

## 2018-07-22 MED ORDER — GLUCAGON HCL (RDNA) 1 MG IJ SOLR
INTRAMUSCULAR | Status: AC | PRN
  Administered 2018-07-22: .5 mg via INTRAVENOUS

## 2018-07-22 MED ORDER — LIDOCAINE HCL 1 % IJ SOLN
INTRAMUSCULAR | Status: AC | PRN
  Administered 2018-07-22: 10 mL

## 2018-07-22 MED ORDER — HYDROMORPHONE HCL 1 MG/ML IJ SOLN: 1.0000 mg | INTRAMUSCULAR | Status: DC | PRN

## 2018-07-22 MED ORDER — IOHEXOL 300 MG/ML  SOLN: 25.0000 mL | Freq: Once | INTRAMUSCULAR | Status: DC | PRN

## 2018-07-22 MED ORDER — GLUCAGON HCL RDNA (DIAGNOSTIC) 1 MG IJ SOLR
INTRAMUSCULAR | Status: AC
  Filled 2018-07-22: qty 1

## 2018-07-22 MED ORDER — ONDANSETRON HCL 4 MG/2ML IJ SOLN: 4.0000 mg | INTRAMUSCULAR | Status: DC | PRN

## 2018-07-22 MED ORDER — LIDOCAINE HCL 1 % IJ SOLN
INTRAMUSCULAR | Status: AC
  Filled 2018-07-22: qty 20

## 2018-07-22 MED ORDER — FENTANYL CITRATE (PF) 100 MCG/2ML IJ SOLN
INTRAMUSCULAR | Status: AC | PRN
  Administered 2018-07-22 (×2): 50 ug via INTRAVENOUS

## 2018-07-22 NOTE — Procedures (Signed)
  Procedure: Perc gastrostomy tube placement 20f EBL:   minimal Complications:  none immediate  See full dictation in [facility] PACS.  D. Cervando Durnin MD Main # [phone] Pager  [phone]    

## 2018-07-22 NOTE — Progress Notes (Signed)
Pulmonary Critical Care Medicine [facility] [place]   PULMONARY CRITICAL CARE SERVICE  PROGRESS NOTE  Date of Service: 07/22/2018  [name] [name]  TRR:[id number]  DOB: 06-30-1955   DOA: 07/02/2018  Referring Physician: Carron Curie, MD  HPI: [name] [name] is a 63 y.o. male seen for follow up of Acute on Chronic Respiratory Failure.  Patient is on T collar currently on 28% FiO2 the goal is for about 4 hours  Medications: Reviewed on Rounds  Physical Exam:  Vitals: Temperature 98.4 pulse 93 respiratory 26 blood pressure 151/78 saturations 98%  Ventilator Settings off the ventilator on T collar currently  . General: Comfortable at this time . Eyes: Grossly normal lids, irises & conjunctiva . ENT: grossly tongue is normal . Neck: no obvious mass . Cardiovascular: S1 S2 normal no gallop . Respiratory: No rhonchi or rales are noted at this time . Abdomen: soft . Skin: no rash seen on limited exam . Musculoskeletal: not rigid . Psychiatric:unable to assess . Neurologic: no seizure no involuntary movements         Lab Data:   Basic Metabolic Panel: Recent Labs  Lab 07/16/18 0538  07/18/18 0841 07/19/18 0450 07/20/18 0527 07/21/18 0705 07/22/18 0459  NA 137  --  138 137  --  135 137  K 3.1*   < > 3.2* 3.2* 3.8 3.2* 3.0*  CL 106  --  105 105  --  105 103  CO2 21*  --  21* 22  --  21* 23  GLUCOSE 136*  --  141* 147*  --  150* 130*  BUN 18  --  16 18  --  21 24*  CREATININE 0.84  --  0.84 0.75  --  0.76 0.85  CALCIUM 8.9  --  9.2 9.7  --  9.3 10.4*  MG  --   --   --  2.2  --  2.2 2.3  PHOS  --   --   --  4.3  --   --  5.0*   < > = values in this interval not displayed.    ABG: No results for input(s): PHART, PCO2ART, PO2ART, HCO3, O2SAT in the last 168 hours.  Liver Function Tests: Recent Labs  Lab 07/21/18 0705  AST 47*  ALT 48*  ALKPHOS 104  BILITOT 0.8  PROT 7.4  ALBUMIN 2.9*   No results for input(s): LIPASE, AMYLASE in the last 168  hours. No results for input(s): AMMONIA in the last 168 hours.  CBC: Recent Labs  Lab 07/17/18 0641 07/21/18 0705 07/22/18 0459  WBC 12.1* 12.8* 12.7*  HGB 14.4 14.8 14.6  HCT 46.2 44.7 44.7  MCV 91.3 89.2 89.6  PLT 278 389 418*    Cardiac Enzymes: No results for input(s): CKTOTAL, CKMB, CKMBINDEX, TROPONINI in the last 168 hours.  BNP (last 3 results) No results for input(s): BNP in the last 8760 hours.  ProBNP (last 3 results) No results for input(s): PROBNP in the last 8760 hours.  Radiological Exams: No results found.  Assessment/Plan Active Problems:   Acute on chronic respiratory failure with hypoxia (HCC)   COPD, severe (HCC)   Chronic atrial fibrillation   Acute systolic heart failure (HCC)   Lobar pneumonia, unspecified organism (HCC)   1. Acute on chronic respiratory failure with hypoxia we will continue with T collar will continue secretion management pulmonary toilet cardiologist goal is 4 hours 2. Severe COPD at baseline continue present management 3. Chronic atrial fibrillation rate controlled 4.  Acute systolic heart failure at baseline 5. Lobar pneumonia treated we will continue to monitor   I have personally seen and evaluated the patient, evaluated laboratory and imaging results, formulated the assessment and plan and placed orders. The Patient requires high complexity decision making for assessment and support.  Case was discussed on Rounds with the Respiratory Therapy Staff  Yevonne Pax, MD [facility] Pulmonary Critical Care Medicine Sleep Medicine

## 2018-07-23 DIAGNOSIS — J449 Chronic obstructive pulmonary disease, unspecified: Secondary | ICD-10-CM | POA: Diagnosis not present

## 2018-07-23 DIAGNOSIS — J9621 Acute and chronic respiratory failure with hypoxia: Secondary | ICD-10-CM | POA: Diagnosis not present

## 2018-07-23 DIAGNOSIS — I5021 Acute systolic (congestive) heart failure: Secondary | ICD-10-CM | POA: Diagnosis not present

## 2018-07-23 DIAGNOSIS — I482 Chronic atrial fibrillation, unspecified: Secondary | ICD-10-CM | POA: Diagnosis not present

## 2018-07-23 LAB — BASIC METABOLIC PANEL
Anion gap: 10 (ref 5–15)
BUN: 22 mg/dL (ref 8–23)
CO2: 24 mmol/L (ref 22–32)
Calcium: 10.2 mg/dL (ref 8.9–10.3)
Chloride: 104 mmol/L (ref 98–111)
Creatinine, Ser: 0.93 mg/dL (ref 0.61–1.24)
GFR calc Af Amer: >60 mL/min (ref >60)
GLUCOSE: 168 mg/dL — AB (ref 70–99)
POTASSIUM: 3 mmol/L — AB (ref 3.5–5.1)
Sodium: 138 mmol/L (ref 135–145)

## 2018-07-23 LAB — PHOSPHORUS: Phosphorus: 3.4 mg/dL (ref 2.5–4.6)

## 2018-07-23 LAB — MAGNESIUM: Magnesium: 2 mg/dL (ref 1.7–2.4)

## 2018-07-23 LAB — POTASSIUM: Potassium: 3.8 mmol/L (ref 3.5–5.1)

## 2018-07-23 NOTE — Progress Notes (Signed)
Referring Physician(s): Dr. Sharyon Medicus  Supervising Physician: Malachy Moan  Patient Status:  [facility] - In-pt  Chief Complaint: Dysphagia  Subjective: Patient alert and communicative on trach collar.  S/p gastrostomy tube placement yesterday.   Allergies: Penicillins; Penicillins; Prednisone; Prednisone; Acetaminophen; Tramadol; Latex; Tylenol [acetaminophen]; Latex; Other; and Trazodone  Medications: Prior to Admission medications   Medication Sig Start Date End Date Taking? Authorizing Provider  amitriptyline (ELAVIL) 25 MG tablet Take 1 tablet (25 mg total) by mouth at bedtime. 12/19/16   Sharlene Dory, DO  aspirin EC 81 MG tablet Take 81 mg by mouth daily.    [provider]  folic acid (FOLVITE) 1 MG tablet Take 1 tablet (1 mg total) by mouth daily. 01/17/17   Sharlene Dory, DO  gabapentin (NEURONTIN) 300 MG capsule Take 1 capsule (300 mg total) by mouth 3 (three) times daily. 12/19/16   Sharlene Dory, DO  hydrOXYzine (ATARAX/VISTARIL) 50 MG tablet TAKE 1 TABLET BY MOUTH THREE TIMES DAILY AS NEEDED 01/17/17   Carmelia Roller, Jilda Roche, DO  meloxicam (MOBIC) 7.5 MG tablet Take 1 tablet (7.5 mg total) by mouth daily. 12/19/16   Sharlene Dory, DO  metoprolol tartrate (LOPRESSOR) 50 MG tablet Take 1 tablet (50 mg total) by mouth 2 (two) times daily. 05/17/17   Sharlene Dory, DO  Multiple Vitamin (MULTIVITAMIN WITH MINERALS) TABS tablet Take 1 tablet by mouth daily. 05/17/15   Elgergawy, Leana Roe, MD  QUEtiapine (SEROQUEL) 100 MG tablet Take 100 mg by mouth at bedtime.    [provider]  thiamine 100 MG tablet Take 1 tablet (100 mg total) by mouth daily. 05/17/15   Elgergawy, Leana Roe, MD  tiZANidine (ZANAFLEX) 2 MG tablet Take 1 tablet (2 mg total) by mouth 3 (three) times daily as needed for muscle spasms. 12/20/16   Sharlene Dory, DO  topiramate (TOPAMAX) 100 MG tablet Take 3 tablets (300 mg total) by mouth at  bedtime. 01/17/17   Sharlene Dory, DO     Vital Signs: BP 98/83   Pulse 90   Resp (!) 25   SpO2 97%   Physical Exam  NAD, alert, trach collar Abdomen: mildly tender at insertion site.  No signs of active bleeding. Appears intact, clean, and dry.  Ok for use.   Imaging: No results found.  Labs:  CBC: Recent Labs    07/14/18 0548 07/17/18 0641 07/21/18 0705 07/22/18 0459  WBC 14.8* 12.1* 12.8* 12.7*  HGB 13.6 14.4 14.8 14.6  HCT 42.4 46.2 44.7 44.7  PLT 287 278 389 418*    COAGS: Recent Labs    07/02/18 2251 07/19/18 1601  INR 1.2 1.1  APTT 42*  --     BMP: Recent Labs    07/19/18 0450 07/20/18 0527 07/21/18 0705 07/22/18 0459 07/23/18 0457  NA 137  --  135 137 138  K 3.2* 3.8 3.2* 3.0* 3.0*  CL 105  --  105 103 104  CO2 22  --  21* 23 24  GLUCOSE 147*  --  150* 130* 168*  BUN 18  --  21 24* 22  CALCIUM 9.7  --  9.3 10.4* 10.2  CREATININE 0.75  --  0.76 0.85 0.93  GFRNONAA >60  --  >60 >60 >60  GFRAA >60  --  >60 >60 >60    LIVER FUNCTION TESTS: Recent Labs    07/11/18 0526 07/13/18 1822 07/14/18 0548 07/21/18 0705  BILITOT  --   --   --  0.8  AST  --   --   --  47*  ALT  --   --   --  48*  ALKPHOS  --   --   --  104  PROT  --   --   --  7.4  ALBUMIN 2.8* 3.4* 2.7* 2.9*    Assessment and Plan: Dsyphagia s/p gastrostomy tube placement 3/17 Patient s/p G-tube placement yesterday.  Tube assessed today.  No issues noted.  No bleeding.  Mildly tender which is expected.  Ready for use.   Electronically Signed: Hoyt Koch, PA 07/23/2018, 1:37 PM   I spent a total of 15 Minutes at the the patient's bedside AND on the patient's hospital floor or unit, greater than 50% of which was counseling/coordinating care for dysphagia.

## 2018-07-23 NOTE — Progress Notes (Addendum)
Pulmonary Critical Care Medicine [facility] [place]   PULMONARY CRITICAL CARE SERVICE  PROGRESS NOTE  Date of Service: 07/23/2018  Olaoluwa Ferns  ZRA:[id number]  DOB: 01-Nov-1955   DOA: 07/02/2018  Referring Physician: Carron Curie, MD  HPI: Maysen Kreikemeier is a 63 y.o. male seen for follow up of Acute on Chronic Respiratory Failure.  Patient failed 3 attempts at weaning today.  He was apneic while awake.  Continues to require 28% FiO2 on full support from ventilator.  Medications: Reviewed on Rounds  Physical Exam:  Vitals: Pulse 114 respirations 26 BP 120/79 O2 sat 96% temp 96.4  Ventilator Settings later mode AC VC rate of 25 tidal volume 440 PEEP of 5 FiO2 28%  . General: Comfortable at this time . Eyes: Grossly normal lids, irises & conjunctiva . ENT: grossly tongue is normal . Neck: no obvious mass . Cardiovascular: S1 S2 normal no gallop . Respiratory: No rales or rhonchi noted . Abdomen: soft . Skin: no rash seen on limited exam . Musculoskeletal: not rigid . Psychiatric:unable to assess . Neurologic: no seizure no involuntary movements         Lab Data:   Basic Metabolic Panel: Recent Labs  Lab 07/18/18 0841 07/19/18 0450 07/20/18 0527 07/21/18 0705 07/22/18 0459 07/23/18 0457 07/23/18 1542  NA 138 137  --  135 137 138  --   K 3.2* 3.2* 3.8 3.2* 3.0* 3.0* 3.8  CL 105 105  --  105 103 104  --   CO2 21* 22  --  21* 23 24  --   GLUCOSE 141* 147*  --  150* 130* 168*  --   BUN 16 18  --  21 24* 22  --   CREATININE 0.84 0.75  --  0.76 0.85 0.93  --   CALCIUM 9.2 9.7  --  9.3 10.4* 10.2  --   MG  --  2.2  --  2.2 2.3 2.0  --   PHOS  --  4.3  --   --  5.0* 3.4  --     ABG: No results for input(s): PHART, PCO2ART, PO2ART, HCO3, O2SAT in the last 168 hours.  Liver Function Tests: Recent Labs  Lab 07/21/18 0705  AST 47*  ALT 48*  ALKPHOS 104  BILITOT 0.8  PROT 7.4  ALBUMIN 2.9*   No results for input(s): LIPASE, AMYLASE in the last  168 hours. No results for input(s): AMMONIA in the last 168 hours.  CBC: Recent Labs  Lab 07/17/18 0641 07/21/18 0705 07/22/18 0459  WBC 12.1* 12.8* 12.7*  HGB 14.4 14.8 14.6  HCT 46.2 44.7 44.7  MCV 91.3 89.2 89.6  PLT 278 389 418*    Cardiac Enzymes: No results for input(s): CKTOTAL, CKMB, CKMBINDEX, TROPONINI in the last 168 hours.  BNP (last 3 results) No results for input(s): BNP in the last 8760 hours.  ProBNP (last 3 results) No results for input(s): PROBNP in the last 8760 hours.  Radiological Exams: No results found.  Assessment/Plan Active Problems:   Acute on chronic respiratory failure with hypoxia (HCC)   COPD, severe (HCC)   Chronic atrial fibrillation   Acute systolic heart failure (HCC)   Lobar pneumonia, unspecified organism (HCC)   1. Acute on chronic respiratory failure with hypoxia continue with weaning per protocol.  Continue secretion management and pulmonary toilet. 2. Severe COPD at baseline continue present management 3. Chronic atrial fibrillation rate controlled 4. Acute systolic heart failure at baseline 5. Lobar pneumonia treated  continue to monitor   I have personally seen and evaluated the patient, evaluated laboratory and imaging results, formulated the assessment and plan and placed orders. The Patient requires high complexity decision making for assessment and support.  Case was discussed on Rounds with the Respiratory Therapy Staff  Yevonne Pax, MD [facility] Pulmonary Critical Care Medicine Sleep Medicine

## 2018-07-24 DIAGNOSIS — J449 Chronic obstructive pulmonary disease, unspecified: Secondary | ICD-10-CM | POA: Diagnosis not present

## 2018-07-24 DIAGNOSIS — I5021 Acute systolic (congestive) heart failure: Secondary | ICD-10-CM | POA: Diagnosis not present

## 2018-07-24 DIAGNOSIS — I482 Chronic atrial fibrillation, unspecified: Secondary | ICD-10-CM | POA: Diagnosis not present

## 2018-07-24 DIAGNOSIS — J9621 Acute and chronic respiratory failure with hypoxia: Secondary | ICD-10-CM | POA: Diagnosis not present

## 2018-07-24 LAB — URINALYSIS, ROUTINE W REFLEX MICROSCOPIC
Bilirubin Urine: NEGATIVE
Glucose, UA: NEGATIVE mg/dL
Hgb urine dipstick: NEGATIVE
KETONES UR: NEGATIVE mg/dL
Leukocytes,Ua: NEGATIVE
Nitrite: NEGATIVE
PH: 9 — AB (ref 5.0–8.0)
Protein, ur: NEGATIVE mg/dL
SPECIFIC GRAVITY, URINE: 1.015 (ref 1.005–1.030)

## 2018-07-24 LAB — BASIC METABOLIC PANEL
Anion gap: 9 (ref 5–15)
BUN: 25 mg/dL — ABNORMAL HIGH (ref 8–23)
CO2: 19 mmol/L — ABNORMAL LOW (ref 22–32)
CREATININE: 0.76 mg/dL (ref 0.61–1.24)
Calcium: 9.9 mg/dL (ref 8.9–10.3)
Chloride: 109 mmol/L (ref 98–111)
GFR calc Af Amer: >60 mL/min (ref >60)
GFR calc non Af Amer: >60 mL/min (ref >60)
Glucose, Bld: 156 mg/dL — ABNORMAL HIGH (ref 70–99)
Potassium: 2.9 mmol/L — ABNORMAL LOW (ref 3.5–5.1)
Sodium: 137 mmol/L (ref 135–145)

## 2018-07-24 LAB — CBC
HEMATOCRIT: 44.5 % (ref 39.0–52.0)
Hemoglobin: 14.9 g/dL (ref 13.0–17.0)
MCH: 29.4 pg (ref 26.0–34.0)
MCHC: 33.5 g/dL (ref 30.0–36.0)
MCV: 87.9 fL (ref 80.0–100.0)
Platelets: 387 10*3/uL (ref 150–400)
RBC: 5.06 MIL/uL (ref 4.22–5.81)
RDW: 14.6 % (ref 11.5–15.5)
WBC: 13.7 10*3/uL — ABNORMAL HIGH (ref 4.0–10.5)
nRBC: 0 % (ref 0.0–0.2)

## 2018-07-24 LAB — TRIGLYCERIDES: Triglycerides: 122 mg/dL (ref <150)

## 2018-07-24 LAB — POTASSIUM: POTASSIUM: 4.8 mmol/L (ref 3.5–5.1)

## 2018-07-24 LAB — MAGNESIUM: Magnesium: 1.9 mg/dL (ref 1.7–2.4)

## 2018-07-24 LAB — PHOSPHORUS: Phosphorus: 3.6 mg/dL (ref 2.5–4.6)

## 2018-07-24 LAB — VANCOMYCIN, TROUGH
Vancomycin Tr: 21 ug/mL (ref 15–20)
Vancomycin Tr: 12 ug/mL — ABNORMAL LOW (ref 15–20)

## 2018-07-24 NOTE — Progress Notes (Signed)
Pulmonary Critical Care Medicine [facility] [place]   PULMONARY CRITICAL CARE SERVICE  PROGRESS NOTE  Date of Service: 07/24/2018  Samik Bapst  KTC:[id number]  DOB: 1955/09/26   DOA: 07/02/2018  Referring Physician: Carron Curie, MD  HPI: [name] [name] is a 63 y.o. male seen for follow up of Acute on Chronic Respiratory Failure.  Patient remains on the ventilator was attempted on T collar and failed right now is back on full support  Medications: Reviewed on Rounds  Physical Exam:  Vitals: Temperature 97.0 pulse 69 respiratory 25 blood pressure 110/58 saturations 97%  Ventilator Settings mode ventilation pressure assist control FiO2 28% tidal volume 520 PEEP 5  . General: Comfortable at this time . Eyes: Grossly normal lids, irises & conjunctiva . ENT: grossly tongue is normal . Neck: no obvious mass . Cardiovascular: S1 S2 normal no gallop . Respiratory: No rhonchi or rales are noted at this time . Abdomen: soft . Skin: no rash seen on limited exam . Musculoskeletal: not rigid . Psychiatric:unable to assess . Neurologic: no seizure no involuntary movements         Lab Data:   Basic Metabolic Panel: Recent Labs  Lab 07/19/18 0450  07/21/18 0705 07/22/18 0459 07/23/18 0457 07/23/18 1542 07/24/18 0537  NA 137  --  135 137 138  --  137  K 3.2*   < > 3.2* 3.0* 3.0* 3.8 2.9*  CL 105  --  105 103 104  --  109  CO2 22  --  21* 23 24  --  19*  GLUCOSE 147*  --  150* 130* 168*  --  156*  BUN 18  --  21 24* 22  --  25*  CREATININE 0.75  --  0.76 0.85 0.93  --  0.76  CALCIUM 9.7  --  9.3 10.4* 10.2  --  9.9  MG 2.2  --  2.2 2.3 2.0  --  1.9  PHOS 4.3  --   --  5.0* 3.4  --  3.6   < > = values in this interval not displayed.    ABG: No results for input(s): PHART, PCO2ART, PO2ART, HCO3, O2SAT in the last 168 hours.  Liver Function Tests: Recent Labs  Lab 07/21/18 0705  AST 47*  ALT 48*  ALKPHOS 104  BILITOT 0.8  PROT 7.4  ALBUMIN 2.9*   No  results for input(s): LIPASE, AMYLASE in the last 168 hours. No results for input(s): AMMONIA in the last 168 hours.  CBC: Recent Labs  Lab 07/21/18 0705 07/22/18 0459 07/24/18 0537  WBC 12.8* 12.7* 13.7*  HGB 14.8 14.6 14.9  HCT 44.7 44.7 44.5  MCV 89.2 89.6 87.9  PLT 389 418* 387    Cardiac Enzymes: No results for input(s): CKTOTAL, CKMB, CKMBINDEX, TROPONINI in the last 168 hours.  BNP (last 3 results) No results for input(s): BNP in the last 8760 hours.  ProBNP (last 3 results) No results for input(s): PROBNP in the last 8760 hours.  Radiological Exams: No results found.  Assessment/Plan Active Problems:   Acute on chronic respiratory failure with hypoxia (HCC)   COPD, severe (HCC)   Chronic atrial fibrillation   Acute systolic heart failure (HCC)   Lobar pneumonia, unspecified organism (HCC)   1. Acute on chronic respiratory failure with hypoxia we will continue with the full vent support for now.  Reassess the T collar weaning.  Continue secretion management pulmonary toilet. 2. Severe COPD will continue with supportive care 3. Chronic  atrial fibrillation rate controlled 4. Acute systolic heart failure compensated at this time. 5. Lobar pneumonia treated we will continue with supportive care   I have personally seen and evaluated the patient, evaluated laboratory and imaging results, formulated the assessment and plan and placed orders. The Patient requires high complexity decision making for assessment and support.  Case was discussed on Rounds with the Respiratory Therapy Staff  Yevonne Pax, MD [facility] Pulmonary Critical Care Medicine Sleep Medicine

## 2018-07-25 DIAGNOSIS — J9621 Acute and chronic respiratory failure with hypoxia: Secondary | ICD-10-CM | POA: Diagnosis not present

## 2018-07-25 DIAGNOSIS — I482 Chronic atrial fibrillation, unspecified: Secondary | ICD-10-CM | POA: Diagnosis not present

## 2018-07-25 DIAGNOSIS — J449 Chronic obstructive pulmonary disease, unspecified: Secondary | ICD-10-CM | POA: Diagnosis not present

## 2018-07-25 DIAGNOSIS — I5021 Acute systolic (congestive) heart failure: Secondary | ICD-10-CM | POA: Diagnosis not present

## 2018-07-25 LAB — BASIC METABOLIC PANEL
Anion gap: 13 (ref 5–15)
BUN: 25 mg/dL — ABNORMAL HIGH (ref 8–23)
CO2: 17 mmol/L — ABNORMAL LOW (ref 22–32)
Calcium: 9.8 mg/dL (ref 8.9–10.3)
Chloride: 108 mmol/L (ref 98–111)
Creatinine, Ser: 0.92 mg/dL (ref 0.61–1.24)
GFR calc Af Amer: >60 mL/min (ref >60)
GFR calc non Af Amer: >60 mL/min (ref >60)
Glucose, Bld: 199 mg/dL — ABNORMAL HIGH (ref 70–99)
Potassium: 3.5 mmol/L (ref 3.5–5.1)
Sodium: 138 mmol/L (ref 135–145)

## 2018-07-25 LAB — CBC
HCT: 47.2 % (ref 39.0–52.0)
Hemoglobin: 15.4 g/dL (ref 13.0–17.0)
MCH: 29.4 pg (ref 26.0–34.0)
MCHC: 32.6 g/dL (ref 30.0–36.0)
MCV: 90.2 fL (ref 80.0–100.0)
Platelets: 375 10*3/uL (ref 150–400)
RBC: 5.23 MIL/uL (ref 4.22–5.81)
RDW: 14.8 % (ref 11.5–15.5)
WBC: 14.7 10*3/uL — ABNORMAL HIGH (ref 4.0–10.5)
nRBC: 0 % (ref 0.0–0.2)

## 2018-07-25 LAB — URINE CULTURE: CULTURE: NO GROWTH

## 2018-07-25 LAB — PHOSPHORUS: Phosphorus: 4.6 mg/dL (ref 2.5–4.6)

## 2018-07-25 LAB — MAGNESIUM: Magnesium: 2 mg/dL (ref 1.7–2.4)

## 2018-07-25 NOTE — Progress Notes (Signed)
Pulmonary Critical Care Medicine [facility] [place]   PULMONARY CRITICAL CARE SERVICE  PROGRESS NOTE  Date of Service: 07/25/2018  [name] [name]  OTR:[id number]  DOB: December 09, 1955   DOA: 07/02/2018  Referring Physician: Carron Curie, MD  HPI: [name] [name] is a 63 y.o. male seen for follow up of Acute on Chronic Respiratory Failure.  Currently on T collar has been on 35% FiO2 with a 16-hour goal  Medications: Reviewed on Rounds  Physical Exam:  Vitals: Temperature 97.3 pulse 108 respiratory 24 blood pressure 110/80 saturations 98%  Ventilator Settings on T collar right now FiO2 is 35%  . General: Comfortable at this time . Eyes: Grossly normal lids, irises & conjunctiva . ENT: grossly tongue is normal . Neck: no obvious mass . Cardiovascular: S1 S2 normal no gallop . Respiratory: No rhonchi or rales are noted at this time . Abdomen: soft . Skin: no rash seen on limited exam . Musculoskeletal: not rigid . Psychiatric:unable to assess . Neurologic: no seizure no involuntary movements         Lab Data:   Basic Metabolic Panel: Recent Labs  Lab 07/19/18 0450  07/21/18 0705 07/22/18 0459 07/23/18 0457 07/23/18 1542 07/24/18 0537 07/24/18 1653 07/25/18 0653  NA 137  --  135 137 138  --  137  --  138  K 3.2*   < > 3.2* 3.0* 3.0* 3.8 2.9* 4.8 3.5  CL 105  --  105 103 104  --  109  --  108  CO2 22  --  21* 23 24  --  19*  --  17*  GLUCOSE 147*  --  150* 130* 168*  --  156*  --  199*  BUN 18  --  21 24* 22  --  25*  --  25*  CREATININE 0.75  --  0.76 0.85 0.93  --  0.76  --  0.92  CALCIUM 9.7  --  9.3 10.4* 10.2  --  9.9  --  9.8  MG 2.2  --  2.2 2.3 2.0  --  1.9  --  2.0  PHOS 4.3  --   --  5.0* 3.4  --  3.6  --  4.6   < > = values in this interval not displayed.    ABG: No results for input(s): PHART, PCO2ART, PO2ART, HCO3, O2SAT in the last 168 hours.  Liver Function Tests: Recent Labs  Lab 07/21/18 0705  AST 47*  ALT 48*  ALKPHOS 104   BILITOT 0.8  PROT 7.4  ALBUMIN 2.9*   No results for input(s): LIPASE, AMYLASE in the last 168 hours. No results for input(s): AMMONIA in the last 168 hours.  CBC: Recent Labs  Lab 07/21/18 0705 07/22/18 0459 07/24/18 0537 07/25/18 0653  WBC 12.8* 12.7* 13.7* 14.7*  HGB 14.8 14.6 14.9 15.4  HCT 44.7 44.7 44.5 47.2  MCV 89.2 89.6 87.9 90.2  PLT 389 418* 387 375    Cardiac Enzymes: No results for input(s): CKTOTAL, CKMB, CKMBINDEX, TROPONINI in the last 168 hours.  BNP (last 3 results) No results for input(s): BNP in the last 8760 hours.  ProBNP (last 3 results) No results for input(s): PROBNP in the last 8760 hours.  Radiological Exams: No results found.  Assessment/Plan Active Problems:   Acute on chronic respiratory failure with hypoxia (HCC)   COPD, severe (HCC)   Chronic atrial fibrillation   Acute systolic heart failure (HCC)   Lobar pneumonia, unspecified organism (HCC)   1.  Acute on chronic respiratory failure with hypoxia we will continue with T collar continue secretion management pulmonary toilet.  Goal is 16 hours as noted 2. Severe COPD at baseline 3. Chronic atrial fibrillation rate controlled 4. Acute systolic heart failure at baseline 5. Lobar pneumonia treated we will continue with supportive care   I have personally seen and evaluated the patient, evaluated laboratory and imaging results, formulated the assessment and plan and placed orders. The Patient requires high complexity decision making for assessment and support.  Case was discussed on Rounds with the Respiratory Therapy Staff  Yevonne Pax, MD [facility] Pulmonary Critical Care Medicine Sleep Medicine

## 2018-07-26 DIAGNOSIS — I5021 Acute systolic (congestive) heart failure: Secondary | ICD-10-CM | POA: Diagnosis not present

## 2018-07-26 DIAGNOSIS — J449 Chronic obstructive pulmonary disease, unspecified: Secondary | ICD-10-CM | POA: Diagnosis not present

## 2018-07-26 DIAGNOSIS — J9621 Acute and chronic respiratory failure with hypoxia: Secondary | ICD-10-CM | POA: Diagnosis not present

## 2018-07-26 DIAGNOSIS — I482 Chronic atrial fibrillation, unspecified: Secondary | ICD-10-CM | POA: Diagnosis not present

## 2018-07-26 LAB — CBC
HCT: 46.6 % (ref 39.0–52.0)
Hemoglobin: 15.4 g/dL (ref 13.0–17.0)
MCH: 29 pg (ref 26.0–34.0)
MCHC: 33 g/dL (ref 30.0–36.0)
MCV: 87.8 fL (ref 80.0–100.0)
Platelets: 391 10*3/uL (ref 150–400)
RBC: 5.31 MIL/uL (ref 4.22–5.81)
RDW: 14.7 % (ref 11.5–15.5)
WBC: 17.3 10*3/uL — ABNORMAL HIGH (ref 4.0–10.5)
nRBC: 0 % (ref 0.0–0.2)

## 2018-07-26 LAB — CULTURE, RESPIRATORY W GRAM STAIN: Culture: NORMAL

## 2018-07-26 LAB — BASIC METABOLIC PANEL
Anion gap: 10 (ref 5–15)
BUN: 21 mg/dL (ref 8–23)
CO2: 20 mmol/L — ABNORMAL LOW (ref 22–32)
CREATININE: 0.77 mg/dL (ref 0.61–1.24)
Calcium: 9.6 mg/dL (ref 8.9–10.3)
Chloride: 106 mmol/L (ref 98–111)
GFR calc Af Amer: >60 mL/min (ref >60)
GFR calc non Af Amer: >60 mL/min (ref >60)
Glucose, Bld: 183 mg/dL — ABNORMAL HIGH (ref 70–99)
Potassium: 3.1 mmol/L — ABNORMAL LOW (ref 3.5–5.1)
Sodium: 136 mmol/L (ref 135–145)

## 2018-07-26 NOTE — Progress Notes (Signed)
Pulmonary Critical Care Medicine [facility] [place]   PULMONARY CRITICAL CARE SERVICE  PROGRESS NOTE  Date of Service: 07/26/2018  [name] [name]  KJI:[id number]  DOB: 1955-08-04   DOA: 07/02/2018  Referring Physician: Carron Curie, MD  HPI: [name] [name] is a 63 y.o. male seen for follow up of Acute on Chronic Respiratory Failure.  Patient right now is on full support had some issues with tachycardia so therefore was not weaning  Medications: Reviewed on Rounds  Physical Exam:  Vitals: Temperature 98.2 pulse 114 respiratory 25 blood pressure 105/68 saturations 97%  Ventilator Settings mode ventilation pressure assist control FiO2 28% tidal volume 719 PEEP 5  . General: Comfortable at this time . Eyes: Grossly normal lids, irises & conjunctiva . ENT: grossly tongue is normal . Neck: no obvious mass . Cardiovascular: S1 S2 normal no gallop . Respiratory: No rhonchi or rales are noted at this time . Abdomen: soft . Skin: no rash seen on limited exam . Musculoskeletal: not rigid . Psychiatric:unable to assess . Neurologic: no seizure no involuntary movements         Lab Data:   Basic Metabolic Panel: Recent Labs  Lab 07/21/18 0705 07/22/18 0459 07/23/18 0457 07/23/18 1542 07/24/18 0537 07/24/18 1653 07/25/18 0653 07/26/18 0348  NA 135 137 138  --  137  --  138 136  K 3.2* 3.0* 3.0* 3.8 2.9* 4.8 3.5 3.1*  CL 105 103 104  --  109  --  108 106  CO2 21* 23 24  --  19*  --  17* 20*  GLUCOSE 150* 130* 168*  --  156*  --  199* 183*  BUN 21 24* 22  --  25*  --  25* 21  CREATININE 0.76 0.85 0.93  --  0.76  --  0.92 0.77  CALCIUM 9.3 10.4* 10.2  --  9.9  --  9.8 9.6  MG 2.2 2.3 2.0  --  1.9  --  2.0  --   PHOS  --  5.0* 3.4  --  3.6  --  4.6  --     ABG: No results for input(s): PHART, PCO2ART, PO2ART, HCO3, O2SAT in the last 168 hours.  Liver Function Tests: Recent Labs  Lab 07/21/18 0705  AST 47*  ALT 48*  ALKPHOS 104  BILITOT 0.8  PROT 7.4   ALBUMIN 2.9*   No results for input(s): LIPASE, AMYLASE in the last 168 hours. No results for input(s): AMMONIA in the last 168 hours.  CBC: Recent Labs  Lab 07/21/18 0705 07/22/18 0459 07/24/18 0537 07/25/18 0653 07/26/18 0348  WBC 12.8* 12.7* 13.7* 14.7* 17.3*  HGB 14.8 14.6 14.9 15.4 15.4  HCT 44.7 44.7 44.5 47.2 46.6  MCV 89.2 89.6 87.9 90.2 87.8  PLT 389 418* 387 375 391    Cardiac Enzymes: No results for input(s): CKTOTAL, CKMB, CKMBINDEX, TROPONINI in the last 168 hours.  BNP (last 3 results) No results for input(s): BNP in the last 8760 hours.  ProBNP (last 3 results) No results for input(s): PROBNP in the last 8760 hours.  Radiological Exams: No results found.  Assessment/Plan Active Problems:   Acute on chronic respiratory failure with hypoxia (HCC)   COPD, severe (HCC)   Chronic atrial fibrillation   Acute systolic heart failure (HCC)   Lobar pneumonia, unspecified organism (HCC)   1. Acute on chronic respiratory failure with hypoxia continue with full support on pressure control continue secretion management pulmonary toilet. 2. Severe COPD at baseline  3. Chronic atrial fibrillation rate controlled 4. Acute systolic heart failure baseline 5. Lobar pneumonia treated clinically improving   I have personally seen and evaluated the patient, evaluated laboratory and imaging results, formulated the assessment and plan and placed orders. The Patient requires high complexity decision making for assessment and support.  Case was discussed on Rounds with the Respiratory Therapy Staff  Yevonne Pax, MD [facility] Pulmonary Critical Care Medicine Sleep Medicine

## 2018-07-27 DIAGNOSIS — J449 Chronic obstructive pulmonary disease, unspecified: Secondary | ICD-10-CM | POA: Diagnosis not present

## 2018-07-27 DIAGNOSIS — J9621 Acute and chronic respiratory failure with hypoxia: Secondary | ICD-10-CM | POA: Diagnosis not present

## 2018-07-27 DIAGNOSIS — I482 Chronic atrial fibrillation, unspecified: Secondary | ICD-10-CM | POA: Diagnosis not present

## 2018-07-27 DIAGNOSIS — I5021 Acute systolic (congestive) heart failure: Secondary | ICD-10-CM | POA: Diagnosis not present

## 2018-07-27 LAB — CBC
HCT: 45.3 % (ref 39.0–52.0)
Hemoglobin: 14.5 g/dL (ref 13.0–17.0)
MCH: 27.8 pg (ref 26.0–34.0)
MCHC: 32 g/dL (ref 30.0–36.0)
MCV: 86.9 fL (ref 80.0–100.0)
Platelets: 377 10*3/uL (ref 150–400)
RBC: 5.21 MIL/uL (ref 4.22–5.81)
RDW: 14.8 % (ref 11.5–15.5)
WBC: 17.1 10*3/uL — ABNORMAL HIGH (ref 4.0–10.5)
nRBC: 0 % (ref 0.0–0.2)

## 2018-07-27 LAB — MAGNESIUM: Magnesium: 2.1 mg/dL (ref 1.7–2.4)

## 2018-07-27 LAB — URINALYSIS, ROUTINE W REFLEX MICROSCOPIC
Bilirubin Urine: NEGATIVE
Glucose, UA: NEGATIVE mg/dL
Hgb urine dipstick: NEGATIVE
Ketones, ur: NEGATIVE mg/dL
Leukocytes,Ua: NEGATIVE
Nitrite: NEGATIVE
Protein, ur: 30 mg/dL — AB
SPECIFIC GRAVITY, URINE: 1.02 (ref 1.005–1.030)
pH: 9 — ABNORMAL HIGH (ref 5.0–8.0)

## 2018-07-27 LAB — BASIC METABOLIC PANEL
Anion gap: 10 (ref 5–15)
BUN: 29 mg/dL — ABNORMAL HIGH (ref 8–23)
CO2: 20 mmol/L — ABNORMAL LOW (ref 22–32)
CREATININE: 0.91 mg/dL (ref 0.61–1.24)
Calcium: 9.2 mg/dL (ref 8.9–10.3)
Chloride: 107 mmol/L (ref 98–111)
GFR calc Af Amer: >60 mL/min (ref >60)
GFR calc non Af Amer: >60 mL/min (ref >60)
Glucose, Bld: 173 mg/dL — ABNORMAL HIGH (ref 70–99)
Potassium: 3.9 mmol/L (ref 3.5–5.1)
Sodium: 137 mmol/L (ref 135–145)

## 2018-07-27 LAB — EXPECTORATED SPUTUM ASSESSMENT W GRAM STAIN, RFLX TO RESP C

## 2018-07-27 LAB — PHOSPHORUS: Phosphorus: 4.5 mg/dL (ref 2.5–4.6)

## 2018-07-27 NOTE — Progress Notes (Signed)
Pulmonary Critical Care Medicine [facility] [place]   PULMONARY CRITICAL CARE SERVICE  PROGRESS NOTE  Date of Service: 07/27/2018  [name] [name]  XLK:[id number]  DOB: 02/27/1956   DOA: 07/02/2018  Referring Physician: Carron Curie, MD  HPI: [name] [name] is a 63 y.o. male seen for follow up of Acute on Chronic Respiratory Failure.  Patient was on full support on pressure control mode and his mechanics were poor still having issues with tachycardia  Medications: Reviewed on Rounds  Physical Exam:  Vitals: Temperature 98.5 pulse 123 respiratory rate 25 blood pressure 106/57 saturation 98%  Ventilator Settings mode ventilation pressure assist control FiO2 28% tidal volume 631 PEEP 5  . General: Comfortable at this time . Eyes: Grossly normal lids, irises & conjunctiva . ENT: grossly tongue is normal . Neck: no obvious mass . Cardiovascular: S1 S2 normal no gallop . Respiratory: No rhonchi or rales are noted at this time . Abdomen: soft . Skin: no rash seen on limited exam . Musculoskeletal: not rigid . Psychiatric:unable to assess . Neurologic: no seizure no involuntary movements         Lab Data:   Basic Metabolic Panel: Recent Labs  Lab 07/22/18 0459 07/23/18 0457  07/24/18 0537 07/24/18 1653 07/25/18 0653 07/26/18 0348 07/27/18 0527  NA 137 138  --  137  --  138 136 137  K 3.0* 3.0*   < > 2.9* 4.8 3.5 3.1* 3.9  CL 103 104  --  109  --  108 106 107  CO2 23 24  --  19*  --  17* 20* 20*  GLUCOSE 130* 168*  --  156*  --  199* 183* 173*  BUN 24* 22  --  25*  --  25* 21 29*  CREATININE 0.85 0.93  --  0.76  --  0.92 0.77 0.91  CALCIUM 10.4* 10.2  --  9.9  --  9.8 9.6 9.2  MG 2.3 2.0  --  1.9  --  2.0  --  2.1  PHOS 5.0* 3.4  --  3.6  --  4.6  --  4.5   < > = values in this interval not displayed.    ABG: No results for input(s): PHART, PCO2ART, PO2ART, HCO3, O2SAT in the last 168 hours.  Liver Function Tests: Recent Labs  Lab 07/21/18 0705   AST 47*  ALT 48*  ALKPHOS 104  BILITOT 0.8  PROT 7.4  ALBUMIN 2.9*   No results for input(s): LIPASE, AMYLASE in the last 168 hours. No results for input(s): AMMONIA in the last 168 hours.  CBC: Recent Labs  Lab 07/22/18 0459 07/24/18 0537 07/25/18 0653 07/26/18 0348 07/27/18 0527  WBC 12.7* 13.7* 14.7* 17.3* 17.1*  HGB 14.6 14.9 15.4 15.4 14.5  HCT 44.7 44.5 47.2 46.6 45.3  MCV 89.6 87.9 90.2 87.8 86.9  PLT 418* 387 375 391 377    Cardiac Enzymes: No results for input(s): CKTOTAL, CKMB, CKMBINDEX, TROPONINI in the last 168 hours.  BNP (last 3 results) No results for input(s): BNP in the last 8760 hours.  ProBNP (last 3 results) No results for input(s): PROBNP in the last 8760 hours.  Radiological Exams: No results found.  Assessment/Plan Active Problems:   Acute on chronic respiratory failure with hypoxia (HCC)   COPD, severe (HCC)   Chronic atrial fibrillation   Acute systolic heart failure (HCC)   Lobar pneumonia, unspecified organism (HCC)   1. Acute on chronic respiratory failure with hypoxia we will continue  with the full vent support still with issues with tachycardia once this is improved we should be able to hopefully resume weaning 2. Severe COPD continue present management supportive care 3. Chronic atrial fibrillation rate is not controlled with increased heart rate discussed with primary care team 4. Acute systolic heart failure at baseline follow radiologically 5. Lobar pneumonia treated we will continue with supportive care   I have personally seen and evaluated the patient, evaluated laboratory and imaging results, formulated the assessment and plan and placed orders. The Patient requires high complexity decision making for assessment and support.  Case was discussed on Rounds with the Respiratory Therapy Staff  Yevonne Pax, MD [facility] Pulmonary Critical Care Medicine Sleep Medicine

## 2018-07-28 DIAGNOSIS — J9621 Acute and chronic respiratory failure with hypoxia: Secondary | ICD-10-CM | POA: Diagnosis not present

## 2018-07-28 DIAGNOSIS — J449 Chronic obstructive pulmonary disease, unspecified: Secondary | ICD-10-CM | POA: Diagnosis not present

## 2018-07-28 DIAGNOSIS — I482 Chronic atrial fibrillation, unspecified: Secondary | ICD-10-CM | POA: Diagnosis not present

## 2018-07-28 DIAGNOSIS — I5021 Acute systolic (congestive) heart failure: Secondary | ICD-10-CM | POA: Diagnosis not present

## 2018-07-28 LAB — BASIC METABOLIC PANEL
Anion gap: 10 (ref 5–15)
BUN: 30 mg/dL — ABNORMAL HIGH (ref 8–23)
CO2: 18 mmol/L — ABNORMAL LOW (ref 22–32)
Calcium: 8.9 mg/dL (ref 8.9–10.3)
Chloride: 107 mmol/L (ref 98–111)
Creatinine, Ser: 0.93 mg/dL (ref 0.61–1.24)
GFR calc Af Amer: >60 mL/min (ref >60)
GFR calc non Af Amer: >60 mL/min (ref >60)
GLUCOSE: 173 mg/dL — AB (ref 70–99)
Potassium: 3.1 mmol/L — ABNORMAL LOW (ref 3.5–5.1)
Sodium: 135 mmol/L (ref 135–145)

## 2018-07-28 LAB — CBC
HCT: 46.9 % (ref 39.0–52.0)
Hemoglobin: 14.7 g/dL (ref 13.0–17.0)
MCH: 27.7 pg (ref 26.0–34.0)
MCHC: 31.3 g/dL (ref 30.0–36.0)
MCV: 88.5 fL (ref 80.0–100.0)
Platelets: 341 10*3/uL (ref 150–400)
RBC: 5.3 MIL/uL (ref 4.22–5.81)
RDW: 14.8 % (ref 11.5–15.5)
WBC: 16.9 10*3/uL — ABNORMAL HIGH (ref 4.0–10.5)
nRBC: 0 % (ref 0.0–0.2)

## 2018-07-28 LAB — TRIGLYCERIDES: Triglycerides: 99 mg/dL (ref <150)

## 2018-07-28 LAB — PHOSPHORUS: Phosphorus: 5.3 mg/dL — ABNORMAL HIGH (ref 2.5–4.6)

## 2018-07-28 LAB — MAGNESIUM: Magnesium: 2 mg/dL (ref 1.7–2.4)

## 2018-07-28 NOTE — Progress Notes (Addendum)
Pulmonary Critical Care Medicine [facility] [place]   PULMONARY CRITICAL CARE SERVICE  PROGRESS NOTE  Date of Service: 07/28/2018  [name] [name]  [id number]  DOB: 1955-11-04   DOA: 07/02/2018  Referring Physician: Carron Curie, MD  HPI: [name] [name] is a 63 y.o. male seen for follow up of Acute on Chronic Respiratory Failure.  Patient is currently doing 20 hours on T collar 35% FiO2.  He is using his PMV well with no distress noted at this time.  Medications: Reviewed on Rounds  Physical Exam:  Vitals: Pulse 71 respirations 24 BP 101/73 O2 sat 97% temp 97.2  Ventilator Settings patient currently on T collar however when he is on vent he is on ventilator mode AC VC rate of 25 IP 20 PEEP of 5 FiO2 28%.  . General: Comfortable at this time . Eyes: Grossly normal lids, irises & conjunctiva . ENT: grossly tongue is normal . Neck: no obvious mass . Cardiovascular: S1 S2 normal no gallop . Respiratory: No rales or rhonchi noted . Abdomen: soft . Skin: no rash seen on limited exam . Musculoskeletal: not rigid . Psychiatric:unable to assess . Neurologic: no seizure no involuntary movements         Lab Data:   Basic Metabolic Panel: Recent Labs  Lab 07/23/18 0457  07/24/18 0537 07/24/18 1653 07/25/18 0653 07/26/18 0348 07/27/18 0527 07/28/18 0036  NA 138  --  137  --  138 136 137 135  K 3.0*   < > 2.9* 4.8 3.5 3.1* 3.9 3.1*  CL 104  --  109  --  108 106 107 107  CO2 24  --  19*  --  17* 20* 20* 18*  GLUCOSE 168*  --  156*  --  199* 183* 173* 173*  BUN 22  --  25*  --  25* 21 29* 30*  CREATININE 0.93  --  0.76  --  0.92 0.77 0.91 0.93  CALCIUM 10.2  --  9.9  --  9.8 9.6 9.2 8.9  MG 2.0  --  1.9  --  2.0  --  2.1 2.0  PHOS 3.4  --  3.6  --  4.6  --  4.5 5.3*   < > = values in this interval not displayed.    ABG: No results for input(s): PHART, PCO2ART, PO2ART, HCO3, O2SAT in the last 168 hours.  Liver Function Tests: No results for input(s):  AST, ALT, ALKPHOS, BILITOT, PROT, ALBUMIN in the last 168 hours. No results for input(s): LIPASE, AMYLASE in the last 168 hours. No results for input(s): AMMONIA in the last 168 hours.  CBC: Recent Labs  Lab 07/24/18 0537 07/25/18 0653 07/26/18 0348 07/27/18 0527 07/28/18 0036  WBC 13.7* 14.7* 17.3* 17.1* 16.9*  HGB 14.9 15.4 15.4 14.5 14.7  HCT 44.5 47.2 46.6 45.3 46.9  MCV 87.9 90.2 87.8 86.9 88.5  PLT 387 375 391 377 341    Cardiac Enzymes: No results for input(s): CKTOTAL, CKMB, CKMBINDEX, TROPONINI in the last 168 hours.  BNP (last 3 results) No results for input(s): BNP in the last 8760 hours.  ProBNP (last 3 results) No results for input(s): PROBNP in the last 8760 hours.  Radiological Exams: No results found.  Assessment/Plan Active Problems:   Acute on chronic respiratory failure with hypoxia (HCC)   COPD, severe (HCC)   Chronic atrial fibrillation   Acute systolic heart failure (HCC)   Lobar pneumonia, unspecified organism (HCC)   1. Acute on chronic respiratory  failure with hypoxia continue with ventilator support and wean as tolerated.  Patient's goal today 20 hours on T collar 35% FiO2.  Doing well at this time.  Continue aggressive pulmonary toilet and secretion management. 2. Severe COPD continue present management continue supportive care 3. Chronic atrial fibrillation rate controlled currently continue present management 4. Acute systolic heart failure baseline follow radiologically 5. Lobar pneumonia treated continue with supportive care   I have personally seen and evaluated the patient, evaluated laboratory and imaging results, formulated the assessment and plan and placed orders. The Patient requires high complexity decision making for assessment and support.  Case was discussed on Rounds with the Respiratory Therapy Staff  Yevonne Pax, MD [facility] Pulmonary Critical Care Medicine Sleep Medicine

## 2018-07-29 DIAGNOSIS — J9621 Acute and chronic respiratory failure with hypoxia: Secondary | ICD-10-CM | POA: Diagnosis not present

## 2018-07-29 DIAGNOSIS — I482 Chronic atrial fibrillation, unspecified: Secondary | ICD-10-CM | POA: Diagnosis not present

## 2018-07-29 DIAGNOSIS — J449 Chronic obstructive pulmonary disease, unspecified: Secondary | ICD-10-CM | POA: Diagnosis not present

## 2018-07-29 DIAGNOSIS — I5021 Acute systolic (congestive) heart failure: Secondary | ICD-10-CM | POA: Diagnosis not present

## 2018-07-29 LAB — POTASSIUM: Potassium: 3.3 mmol/L — ABNORMAL LOW (ref 3.5–5.1)

## 2018-07-29 NOTE — Progress Notes (Addendum)
Pulmonary Critical Care Medicine [facility] [place]   PULMONARY CRITICAL CARE SERVICE  PROGRESS NOTE  Date of Service: 07/29/2018  [name] [name]  [id number]:[id number]  DOB: 09-23-1955   DOA: 07/02/2018  Referring Physician: Carron Curie, MD  HPI: [name] [name] is a 63 y.o. male seen for follow up of Acute on Chronic Respiratory Failure.  Patient continues to do well on T collar currently has a 24-hour goal on 28% FiO2.  Using PMV without difficulty.  Medications: Reviewed on Rounds  Physical Exam:  Vitals: Pulse 116 respirations 20 BP 113/67 O2 sat 92% temp 98.5  Ventilator Settings patient's not currently on ventilator  . General: Comfortable at this time . Eyes: Grossly normal lids, irises & conjunctiva . ENT: grossly tongue is normal . Neck: no obvious mass . Cardiovascular: S1 S2 normal no gallop . Respiratory: No rales or rhonchi noted . Abdomen: soft . Skin: no rash seen on limited exam . Musculoskeletal: not rigid . Psychiatric:unable to assess . Neurologic: no seizure no involuntary movements         Lab Data:   Basic Metabolic Panel: Recent Labs  Lab 07/23/18 0457  07/24/18 0537  07/25/18 0653 07/26/18 0348 07/27/18 0527 07/28/18 0036 07/29/18 0449  NA 138  --  137  --  138 136 137 135  --   K 3.0*   < > 2.9*   < > 3.5 3.1* 3.9 3.1* 3.3*  CL 104  --  109  --  108 106 107 107  --   CO2 24  --  19*  --  17* 20* 20* 18*  --   GLUCOSE 168*  --  156*  --  199* 183* 173* 173*  --   BUN 22  --  25*  --  25* 21 29* 30*  --   CREATININE 0.93  --  0.76  --  0.92 0.77 0.91 0.93  --   CALCIUM 10.2  --  9.9  --  9.8 9.6 9.2 8.9  --   MG 2.0  --  1.9  --  2.0  --  2.1 2.0  --   PHOS 3.4  --  3.6  --  4.6  --  4.5 5.3*  --    < > = values in this interval not displayed.    ABG: No results for input(s): PHART, PCO2ART, PO2ART, HCO3, O2SAT in the last 168 hours.  Liver Function Tests: No results for input(s): AST, ALT, ALKPHOS, BILITOT, PROT, ALBUMIN  in the last 168 hours. No results for input(s): LIPASE, AMYLASE in the last 168 hours. No results for input(s): AMMONIA in the last 168 hours.  CBC: Recent Labs  Lab 07/24/18 0537 07/25/18 0653 07/26/18 0348 07/27/18 0527 07/28/18 0036  WBC 13.7* 14.7* 17.3* 17.1* 16.9*  HGB 14.9 15.4 15.4 14.5 14.7  HCT 44.5 47.2 46.6 45.3 46.9  MCV 87.9 90.2 87.8 86.9 88.5  PLT 387 375 391 377 341    Cardiac Enzymes: No results for input(s): CKTOTAL, CKMB, CKMBINDEX, TROPONINI in the last 168 hours.  BNP (last 3 results) No results for input(s): BNP in the last 8760 hours.  ProBNP (last 3 results) No results for input(s): PROBNP in the last 8760 hours.  Radiological Exams: No results found.  Assessment/Plan Active Problems:   Acute on chronic respiratory failure with hypoxia (HCC)   COPD, severe (HCC)   Chronic atrial fibrillation   Acute systolic heart failure (HCC)   Lobar pneumonia, unspecified organism (HCC)  1. Acute on chronic tori failure with hypoxia continue with ventilator support and wean as tolerated per protocol.  Goal today is 24 hours on T collar 28% FiO2.  Doing well at this time continue aggressive pulmonary toilet and secretion management 2. Severe COPD continue present management continue supportive care 3. Chronic atrial fibrillation rate controlled currently continue present management 4. Acute systolic heart failure baseline 5. Lobar pneumonia treated continue supportive care   I have personally seen and evaluated the patient, evaluated laboratory and imaging results, formulated the assessment and plan and placed orders. The Patient requires high complexity decision making for assessment and support.  Case was discussed on Rounds with the Respiratory Therapy Staff  Yevonne Pax, MD [facility] Pulmonary Critical Care Medicine Sleep Medicine

## 2018-07-30 DIAGNOSIS — J9621 Acute and chronic respiratory failure with hypoxia: Secondary | ICD-10-CM | POA: Diagnosis not present

## 2018-07-30 DIAGNOSIS — I5021 Acute systolic (congestive) heart failure: Secondary | ICD-10-CM | POA: Diagnosis not present

## 2018-07-30 DIAGNOSIS — I482 Chronic atrial fibrillation, unspecified: Secondary | ICD-10-CM | POA: Diagnosis not present

## 2018-07-30 DIAGNOSIS — J449 Chronic obstructive pulmonary disease, unspecified: Secondary | ICD-10-CM | POA: Diagnosis not present

## 2018-07-30 LAB — CULTURE, RESPIRATORY W GRAM STAIN

## 2018-07-30 LAB — URINE CULTURE: Culture: >=100000 — AB

## 2018-07-30 LAB — BASIC METABOLIC PANEL
Anion gap: 12 (ref 5–15)
BUN: 32 mg/dL — ABNORMAL HIGH (ref 8–23)
CALCIUM: 9.5 mg/dL (ref 8.9–10.3)
CO2: 23 mmol/L (ref 22–32)
Chloride: 102 mmol/L (ref 98–111)
Creatinine, Ser: 0.88 mg/dL (ref 0.61–1.24)
GFR calc Af Amer: >60 mL/min (ref >60)
GFR calc non Af Amer: >60 mL/min (ref >60)
Glucose, Bld: 142 mg/dL — ABNORMAL HIGH (ref 70–99)
Potassium: 3.3 mmol/L — ABNORMAL LOW (ref 3.5–5.1)
Sodium: 137 mmol/L (ref 135–145)

## 2018-07-30 LAB — CBC
HCT: 47.3 % (ref 39.0–52.0)
Hemoglobin: 15.5 g/dL (ref 13.0–17.0)
MCH: 28.9 pg (ref 26.0–34.0)
MCHC: 32.8 g/dL (ref 30.0–36.0)
MCV: 88.1 fL (ref 80.0–100.0)
Platelets: 337 10*3/uL (ref 150–400)
RBC: 5.37 MIL/uL (ref 4.22–5.81)
RDW: 14.6 % (ref 11.5–15.5)
WBC: 13.2 10*3/uL — ABNORMAL HIGH (ref 4.0–10.5)
nRBC: 0 % (ref 0.0–0.2)

## 2018-07-30 LAB — MAGNESIUM: Magnesium: 2.2 mg/dL (ref 1.7–2.4)

## 2018-07-30 NOTE — Progress Notes (Addendum)
Pulmonary Critical Care Medicine [facility] [place]   PULMONARY CRITICAL CARE SERVICE  PROGRESS NOTE  Date of Service: 07/30/2018  [name] [name]  [id number]:[id number]  DOB: 1956/04/15   DOA: 07/02/2018  Referring Physician: Carron Curie, MD  HPI: [name] [name] is a 63 y.o. male seen for follow up of Acute on Chronic Respiratory Failure.  Patient has now been 24 hours on T collar 28% FiO2.  The new goal is now 48 hours.  Patient is doing well at this time using PMV.  Medications: Reviewed on Rounds  Physical Exam:  Vitals: Pulse 79 respirations 29 BP 120/88 O2 sat 94% temp 98.5  Ventilator Settings not currently on ventilator  . General: Comfortable at this time . Eyes: Grossly normal lids, irises & conjunctiva . ENT: grossly tongue is normal . Neck: no obvious mass . Cardiovascular: S1 S2 normal no gallop . Respiratory: No rales or rhonchi noted . Abdomen: soft . Skin: no rash seen on limited exam . Musculoskeletal: not rigid . Psychiatric:unable to assess . Neurologic: no seizure no involuntary movements         Lab Data:   Basic Metabolic Panel: Recent Labs  Lab 07/24/18 0537  07/25/18 0630 07/26/18 0348 07/27/18 0527 07/28/18 0036 07/29/18 0449 07/30/18 0620  NA 137  --  138 136 137 135  --  137  K 2.9*   < > 3.5 3.1* 3.9 3.1* 3.3* 3.3*  CL 109  --  108 106 107 107  --  102  CO2 19*  --  17* 20* 20* 18*  --  23  GLUCOSE 156*  --  199* 183* 173* 173*  --  142*  BUN 25*  --  25* 21 29* 30*  --  32*  CREATININE 0.76  --  0.92 0.77 0.91 0.93  --  0.88  CALCIUM 9.9  --  9.8 9.6 9.2 8.9  --  9.5  MG 1.9  --  2.0  --  2.1 2.0  --  2.2  PHOS 3.6  --  4.6  --  4.5 5.3*  --   --    < > = values in this interval not displayed.    ABG: No results for input(s): PHART, PCO2ART, PO2ART, HCO3, O2SAT in the last 168 hours.  Liver Function Tests: No results for input(s): AST, ALT, ALKPHOS, BILITOT, PROT, ALBUMIN in the last 168 hours. No results for  input(s): LIPASE, AMYLASE in the last 168 hours. No results for input(s): AMMONIA in the last 168 hours.  CBC: Recent Labs  Lab 07/25/18 0653 07/26/18 0348 07/27/18 0527 07/28/18 0036 07/30/18 0620  WBC 14.7* 17.3* 17.1* 16.9* 13.2*  HGB 15.4 15.4 14.5 14.7 15.5  HCT 47.2 46.6 45.3 46.9 47.3  MCV 90.2 87.8 86.9 88.5 88.1  PLT 375 391 377 341 337    Cardiac Enzymes: No results for input(s): CKTOTAL, CKMB, CKMBINDEX, TROPONINI in the last 168 hours.  BNP (last 3 results) No results for input(s): BNP in the last 8760 hours.  ProBNP (last 3 results) No results for input(s): PROBNP in the last 8760 hours.  Radiological Exams: No results found.  Assessment/Plan Active Problems:   Acute on chronic respiratory failure with hypoxia (HCC)   COPD, severe (HCC)   Chronic atrial fibrillation   Acute systolic heart failure (HCC)   Lobar pneumonia, unspecified organism (HCC)   1. Acute on chronic respiratory failure with hypoxia continue with  weaning as per protocol.  Goal is 48 hours on T collar  28% FiO2.  Doing well at this time continue aggressive pulmonary toilet and secretion management. 2. Severe COPD continue present management and supportive care 3. Chronic atrial fibrillation rate controlled continue present management 4. Acute systolic heart failure at baseline 5. Lobar pneumonia treated continue supportive care   I have personally seen and evaluated the patient, evaluated laboratory and imaging results, formulated the assessment and plan and placed orders. The Patient requires high complexity decision making for assessment and support.  Case was discussed on Rounds with the Respiratory Therapy Staff  Yevonne Pax, MD [facility] Pulmonary Critical Care Medicine Sleep Medicine

## 2018-07-31 DIAGNOSIS — J449 Chronic obstructive pulmonary disease, unspecified: Secondary | ICD-10-CM | POA: Diagnosis not present

## 2018-07-31 DIAGNOSIS — I5021 Acute systolic (congestive) heart failure: Secondary | ICD-10-CM | POA: Diagnosis not present

## 2018-07-31 DIAGNOSIS — I482 Chronic atrial fibrillation, unspecified: Secondary | ICD-10-CM | POA: Diagnosis not present

## 2018-07-31 DIAGNOSIS — J9621 Acute and chronic respiratory failure with hypoxia: Secondary | ICD-10-CM | POA: Diagnosis not present

## 2018-07-31 LAB — BLOOD GAS, ARTERIAL
ACID-BASE DEFICIT: 0.8 mmol/L (ref 0.0–2.0)
Bicarbonate: 22.7 mmol/L (ref 20.0–28.0)
FIO2: 28
O2 Saturation: 97.9 %
PO2 ART: 106 mmHg (ref 83.0–108.0)
Patient temperature: 96.9
pCO2 arterial: 32.2 mmHg (ref 32.0–48.0)
pH, Arterial: 7.457 — ABNORMAL HIGH (ref 7.350–7.450)

## 2018-07-31 LAB — POTASSIUM: POTASSIUM: 4.4 mmol/L (ref 3.5–5.1)

## 2018-07-31 LAB — TRIGLYCERIDES: TRIGLYCERIDES: 114 mg/dL (ref <150)

## 2018-07-31 NOTE — Progress Notes (Addendum)
Pulmonary Critical Care Medicine [facility] [place]   PULMONARY CRITICAL CARE SERVICE  PROGRESS NOTE  Date of Service: 07/31/2018  [name] [name]  HTD:[id number]  DOB: [birth year]   DOA: 07/02/2018  Referring Physician: Carron Curie, MD  HPI: [name] [name] is a 63 y.o. male seen for follow up of Acute on Chronic Respiratory Failure.  Patient current goal is 48 hours on T collar 28% FiO2.  Patient is using PMV and doing well at this time.  Medications: Reviewed on Rounds  Physical Exam:  Vitals: Pulse 85 respirations 18 BP 92/54 O2 sat 96% temp 97.1  Ventilator Settings not only on ventilator  . General: Comfortable at this time . Eyes: Grossly normal lids, irises & conjunctiva . ENT: grossly tongue is normal . Neck: no obvious mass . Cardiovascular: S1 S2 normal no gallop . Respiratory: No rales or rhonchi noted . Abdomen: soft . Skin: no rash seen on limited exam . Musculoskeletal: not rigid . Psychiatric:unable to assess . Neurologic: no seizure no involuntary movements         Lab Data:   Basic Metabolic Panel: Recent Labs  Lab 07/25/18 0653 07/26/18 0348 07/27/18 0527 07/28/18 0036 07/29/18 0449 07/30/18 0620 07/31/18 0033  NA 138 136 137 135  --  137  --   K 3.5 3.1* 3.9 3.1* 3.3* 3.3* 4.4  CL 108 106 107 107  --  102  --   CO2 17* 20* 20* 18*  --  23  --   GLUCOSE 199* 183* 173* 173*  --  142*  --   BUN 25* 21 29* 30*  --  32*  --   CREATININE 0.92 0.77 0.91 0.93  --  0.88  --   CALCIUM 9.8 9.6 9.2 8.9  --  9.5  --   MG 2.0  --  2.1 2.0  --  2.2  --   PHOS 4.6  --  4.5 5.3*  --   --   --     ABG: No results for input(s): PHART, PCO2ART, PO2ART, HCO3, O2SAT in the last 168 hours.  Liver Function Tests: No results for input(s): AST, ALT, ALKPHOS, BILITOT, PROT, ALBUMIN in the last 168 hours. No results for input(s): LIPASE, AMYLASE in the last 168 hours. No results for input(s): AMMONIA in the last 168 hours.  CBC: Recent Labs  Lab  07/25/18 0653 07/26/18 0348 07/27/18 0527 07/28/18 0036 07/30/18 0620  WBC 14.7* 17.3* 17.1* 16.9* 13.2*  HGB 15.4 15.4 14.5 14.7 15.5  HCT 47.2 46.6 45.3 46.9 47.3  MCV 90.2 87.8 86.9 88.5 88.1  PLT 375 391 377 341 337    Cardiac Enzymes: No results for input(s): CKTOTAL, CKMB, CKMBINDEX, TROPONINI in the last 168 hours.  BNP (last 3 results) No results for input(s): BNP in the last 8760 hours.  ProBNP (last 3 results) No results for input(s): PROBNP in the last 8760 hours.  Radiological Exams: No results found.  Assessment/Plan Active Problems:   Acute on chronic respiratory failure with hypoxia (HCC)   COPD, severe (HCC)   Chronic atrial fibrillation   Acute systolic heart failure (HCC)   Lobar pneumonia, unspecified organism (HCC)   1. Acute on chronic respiratory failure with hypoxia continue weaning per protocol.  Goal is 48 hours on trach collar today 88% FiO2..This time, continue rest of pulmonary toilet and secretion management 2. Severe COPD continue present management and supportive care 3. Chronic atrial fibrillation rate controlled continue present management 4. Acute systolic heart at  baseline 5. Lobar pneumonia treated continue supportive care   I have personally seen and evaluated the patient, evaluated laboratory and imaging results, formulated the assessment and plan and placed orders. The Patient requires high complexity decision making for assessment and support.  Case was discussed on Rounds with the Respiratory Therapy Staff  Yevonne Pax, MD [facility] Pulmonary Critical Care Medicine Sleep Medicine

## 2018-08-01 DIAGNOSIS — J9621 Acute and chronic respiratory failure with hypoxia: Secondary | ICD-10-CM | POA: Diagnosis not present

## 2018-08-01 DIAGNOSIS — I482 Chronic atrial fibrillation, unspecified: Secondary | ICD-10-CM | POA: Diagnosis not present

## 2018-08-01 DIAGNOSIS — I5021 Acute systolic (congestive) heart failure: Secondary | ICD-10-CM | POA: Diagnosis not present

## 2018-08-01 DIAGNOSIS — J449 Chronic obstructive pulmonary disease, unspecified: Secondary | ICD-10-CM | POA: Diagnosis not present

## 2018-08-01 LAB — CULTURE, BLOOD (ROUTINE X 2)
Culture: NO GROWTH
Culture: NO GROWTH
SPECIAL REQUESTS: ADEQUATE

## 2018-08-01 NOTE — Progress Notes (Addendum)
Pulmonary Critical Care Medicine [facility] [place]   PULMONARY CRITICAL CARE SERVICE  PROGRESS NOTE  Date of Service: 08/01/2018  [name] [name]  [id number]:[id number]  DOB: [birth year]   DOA: 07/02/2018  Referring Physician: Carron Curie, MD  HPI: [name] [name] is a 63 y.o. male seen for follow up of Acute on Chronic Respiratory Failure.  Patient is now been 72 hours on T collar 28% FiO2%.  He does have a elevated heart rate today which medicine is addressing.,  Overall doing well at this time.  Medications: Reviewed on Rounds  Physical Exam:  Vitals: Pulse 145 respirations 38 BP 115/78 O2 sat 98% temp 97.2  Ventilator Settings not currently on ventilator  . General: Comfortable at this time . Eyes: Grossly normal lids, irises & conjunctiva . ENT: grossly tongue is normal . Neck: no obvious mass . Cardiovascular: S1 S2 normal no gallop . Respiratory: No rales or rhonchi noted . Abdomen: soft . Skin: no rash seen on limited exam . Musculoskeletal: not rigid . Psychiatric:unable to assess . Neurologic: no seizure no involuntary movements         Lab Data:   Basic Metabolic Panel: Recent Labs  Lab 07/26/18 0348 07/27/18 0527 07/28/18 0036 07/29/18 0449 07/30/18 0620 07/31/18 0033  NA 136 137 135  --  137  --   K 3.1* 3.9 3.1* 3.3* 3.3* 4.4  CL 106 107 107  --  102  --   CO2 20* 20* 18*  --  23  --   GLUCOSE 183* 173* 173*  --  142*  --   BUN 21 29* 30*  --  32*  --   CREATININE 0.77 0.91 0.93  --  0.88  --   CALCIUM 9.6 9.2 8.9  --  9.5  --   MG  --  2.1 2.0  --  2.2  --   PHOS  --  4.5 5.3*  --   --   --     ABG: Recent Labs  Lab 07/31/18 1600  PHART 7.457*  PCO2ART 32.2  PO2ART 106  HCO3 22.7  O2SAT 97.9    Liver Function Tests: No results for input(s): AST, ALT, ALKPHOS, BILITOT, PROT, ALBUMIN in the last 168 hours. No results for input(s): LIPASE, AMYLASE in the last 168 hours. No results for input(s): AMMONIA in the last 168  hours.  CBC: Recent Labs  Lab 07/26/18 0348 07/27/18 0527 07/28/18 0036 07/30/18 0620  WBC 17.3* 17.1* 16.9* 13.2*  HGB 15.4 14.5 14.7 15.5  HCT 46.6 45.3 46.9 47.3  MCV 87.8 86.9 88.5 88.1  PLT 391 377 341 337    Cardiac Enzymes: No results for input(s): CKTOTAL, CKMB, CKMBINDEX, TROPONINI in the last 168 hours.  BNP (last 3 results) No results for input(s): BNP in the last 8760 hours.  ProBNP (last 3 results) No results for input(s): PROBNP in the last 8760 hours.  Radiological Exams: No results found.  Assessment/Plan Active Problems:   Acute on chronic respiratory failure with hypoxia (HCC)   COPD, severe (HCC)   Chronic atrial fibrillation   Acute systolic heart failure (HCC)   Lobar pneumonia, unspecified organism (HCC)   1. Acute on chronic respiratory failure with hypoxia continue weaning per protocol.  Patient has completed 72 hours on trach collar without ventilator.  O2 sat currently 98%.  Continue pulmonary toilet and secretion management at this time. 2. Severe COPD continue present management supportive care 3. Chronic atrial fibrillation rate controlled continue present management 4.  Acute systolic heart failure at baseline 5. Lobar pneumonia treated continue supportive care   I have personally seen and evaluated the patient, evaluated laboratory and imaging results, formulated the assessment and plan and placed orders. The Patient requires high complexity decision making for assessment and support.  Case was discussed on Rounds with the Respiratory Therapy Staff  Yevonne Pax, MD [facility] Pulmonary Critical Care Medicine Sleep Medicine

## 2018-08-02 ENCOUNTER — Other Ambulatory Visit (HOSPITAL_COMMUNITY): Payer: Medicare Other

## 2018-08-02 DIAGNOSIS — I482 Chronic atrial fibrillation, unspecified: Secondary | ICD-10-CM | POA: Diagnosis not present

## 2018-08-02 DIAGNOSIS — J449 Chronic obstructive pulmonary disease, unspecified: Secondary | ICD-10-CM | POA: Diagnosis not present

## 2018-08-02 DIAGNOSIS — J9621 Acute and chronic respiratory failure with hypoxia: Secondary | ICD-10-CM | POA: Diagnosis not present

## 2018-08-02 DIAGNOSIS — I5021 Acute systolic (congestive) heart failure: Secondary | ICD-10-CM | POA: Diagnosis not present

## 2018-08-02 LAB — CBC
HCT: 52.5 % — ABNORMAL HIGH (ref 39.0–52.0)
Hemoglobin: 16.5 g/dL (ref 13.0–17.0)
MCH: 27.9 pg (ref 26.0–34.0)
MCHC: 31.4 g/dL (ref 30.0–36.0)
MCV: 88.7 fL (ref 80.0–100.0)
NRBC: 0 % (ref 0.0–0.2)
PLATELETS: 427 10*3/uL — AB (ref 150–400)
RBC: 5.92 MIL/uL — ABNORMAL HIGH (ref 4.22–5.81)
RDW: 14.6 % (ref 11.5–15.5)
WBC: 17.2 10*3/uL — ABNORMAL HIGH (ref 4.0–10.5)

## 2018-08-02 LAB — RENAL FUNCTION PANEL
Albumin: 3 g/dL — ABNORMAL LOW (ref 3.5–5.0)
Anion gap: 12 (ref 5–15)
BUN: 31 mg/dL — AB (ref 8–23)
CO2: 23 mmol/L (ref 22–32)
CREATININE: 0.94 mg/dL (ref 0.61–1.24)
Calcium: 9.7 mg/dL (ref 8.9–10.3)
Chloride: 105 mmol/L (ref 98–111)
GFR calc Af Amer: >60 mL/min (ref >60)
GFR calc non Af Amer: >60 mL/min (ref >60)
Glucose, Bld: 167 mg/dL — ABNORMAL HIGH (ref 70–99)
Phosphorus: 3.3 mg/dL (ref 2.5–4.6)
Potassium: 4 mmol/L (ref 3.5–5.1)
SODIUM: 140 mmol/L (ref 135–145)

## 2018-08-02 LAB — MAGNESIUM: Magnesium: 2.6 mg/dL — ABNORMAL HIGH (ref 1.7–2.4)

## 2018-08-02 NOTE — Progress Notes (Addendum)
Pulmonary Critical Care Medicine [facility] [place]   PULMONARY CRITICAL CARE SERVICE  PROGRESS NOTE  Date of Service: 08/02/2018  [name] [name]  [id number]  DOB: [birth year]   DOA: 07/02/2018  Referring Physician: Carron Curie, MD  HPI: [name] [name] is a 63 y.o. male seen for follow up of Acute on Chronic Respiratory Failure.  Patient continues on T collar 28% FiO2.  Using PMV without difficulty.  He does continue to have elevated heart rate however it has proved.  Medications: Reviewed on Rounds  Physical Exam:  Vitals: Pulse 112 respirations 20 BP 20/72 O2 sat 100% tonight 7.5  Ventilator Settings not currently on later  . General: Comfortable at this time . Eyes: Grossly normal lids, irises & conjunctiva . ENT: grossly tongue is normal . Neck: no obvious mass . Cardiovascular: S1 S2 normal no gallop . Respiratory: Noted . Abdomen: soft . Skin: no rash seen on limited exam . Musculoskeletal: not rigid . Psychiatric:unable to assess . Neurologic: no seizure no involuntary movements         Lab Data:   Basic Metabolic Panel: Recent Labs  Lab 07/27/18 0527 07/28/18 0036 07/29/18 0449 07/30/18 0620 07/31/18 0033 08/02/18 0442  NA 137 135  --  137  --  140  K 3.9 3.1* 3.3* 3.3* 4.4 4.0  CL 107 107  --  102  --  105  CO2 20* 18*  --  23  --  23  GLUCOSE 173* 173*  --  142*  --  167*  BUN 29* 30*  --  32*  --  31*  CREATININE 0.91 0.93  --  0.88  --  0.94  CALCIUM 9.2 8.9  --  9.5  --  9.7  MG 2.1 2.0  --  2.2  --  2.6*  PHOS 4.5 5.3*  --   --   --  3.3    ABG: Recent Labs  Lab 07/31/18 1600  PHART 7.457*  PCO2ART 32.2  PO2ART 106  HCO3 22.7  O2SAT 97.9    Liver Function Tests: Recent Labs  Lab 08/02/18 0442  ALBUMIN 3.0*   No results for input(s): LIPASE, AMYLASE in the last 168 hours. No results for input(s): AMMONIA in the last 168 hours.  CBC: Recent Labs  Lab 07/27/18 0527 07/28/18 0036 07/30/18 0620 08/02/18 0442   WBC 17.1* 16.9* 13.2* 17.2*  HGB 14.5 14.7 15.5 16.5  HCT 45.3 46.9 47.3 52.5*  MCV 86.9 88.5 88.1 88.7  PLT 377 341 337 427*    Cardiac Enzymes: No results for input(s): CKTOTAL, CKMB, CKMBINDEX, TROPONINI in the last 168 hours.  BNP (last 3 results) No results for input(s): BNP in the last 8760 hours.  ProBNP (last 3 results) No results for input(s): PROBNP in the last 8760 hours.  Radiological Exams: Dg Chest Port 1 View  Result Date: 08/02/2018 CLINICAL DATA:  Respiratory failure, pulmonary edema. EXAM: PORTABLE CHEST 1 VIEW COMPARISON:  Radiograph July 19, 2018. FINDINGS: Stable cardiomediastinal silhouette. Tracheostomy tube is unchanged in position. Nasogastric tube has been removed. No pneumothorax or pleural effusion is noted. No acute pulmonary disease is noted. Bony thorax is unremarkable. IMPRESSION: Tracheostomy tube in grossly good position. No acute cardiopulmonary abnormality seen. Electronically Signed   By: Lupita Raider, M.D.   On: 08/02/2018 08:06    Assessment/Plan Active Problems:   Acute on chronic respiratory failure with hypoxia (HCC)   COPD, severe (HCC)   Chronic atrial fibrillation   Acute systolic heart  failure (HCC)   Lobar pneumonia, unspecified organism (HCC)   1. Acute on chronic respiratory failure with hypoxia continue weaning per protocol.  Patient is now completed 96 hours on T collar without ventilator support.  He continues to have an O2 saturation that is good.  Continue pulmonary toilet and secretion management at this time. 2. Severe COPD continue present management supportive care 3. Chronic atrial fibrillation rate controlled continue present management 4. Acute systolic heart failure at baseline 5. Lobar pneumonia treated continue supportive care   I have personally seen and evaluated the patient, evaluated laboratory and imaging results, formulated the assessment and plan and placed orders. The Patient requires high complexity  decision making for assessment and support.  Case was discussed on Rounds with the Respiratory Therapy Staff  Yevonne Pax, MD [facility] Pulmonary Critical Care Medicine Sleep Medicine

## 2018-08-03 DIAGNOSIS — I5021 Acute systolic (congestive) heart failure: Secondary | ICD-10-CM | POA: Diagnosis not present

## 2018-08-03 DIAGNOSIS — I482 Chronic atrial fibrillation, unspecified: Secondary | ICD-10-CM | POA: Diagnosis not present

## 2018-08-03 DIAGNOSIS — J9621 Acute and chronic respiratory failure with hypoxia: Secondary | ICD-10-CM | POA: Diagnosis not present

## 2018-08-03 DIAGNOSIS — J449 Chronic obstructive pulmonary disease, unspecified: Secondary | ICD-10-CM | POA: Diagnosis not present

## 2018-08-03 LAB — BLOOD GAS, ARTERIAL
Acid-base deficit: 1.8 mmol/L (ref 0.0–2.0)
Bicarbonate: 21.5 mmol/L (ref 20.0–28.0)
Drawn by: 237031
FIO2: 30
O2 Saturation: 96 %
PCO2 ART: 30.4 mmHg — AB (ref 32.0–48.0)
PH ART: 7.463 — AB (ref 7.350–7.450)
Patient temperature: 98.6
RATE: 24 resp/min
pO2, Arterial: 81.5 mmHg — ABNORMAL LOW (ref 83.0–108.0)

## 2018-08-03 LAB — TRIGLYCERIDES: Triglycerides: 110 mg/dL (ref <150)

## 2018-08-03 NOTE — Progress Notes (Addendum)
Pulmonary Critical Care Medicine [facility] [place]   PULMONARY CRITICAL CARE SERVICE  PROGRESS NOTE  Date of Service: 08/03/2018  [name] [name]  DDU:[id number]  DOB: 07-22-1955   DOA: 07/02/2018  Referring Physician: Carron Curie, MD  HPI: [name] [name] is a 63 y.o. male seen for follow up of Acute on Chronic Respiratory Failure.  Patient's FiO2 increased to 30% on T collar today.  RT reports patient has been having variable saturations.  Patient is been able to maintain saturations greater than 90%.  Medications: Reviewed on Rounds  Physical Exam:  Vitals: Pulse 84 respirations 20 BP 93/65 O2 sat 94% temp 97.5  Ventilator Settings not currently on ventilator  . General: Comfortable at this time . Eyes: Grossly normal lids, irises & conjunctiva . ENT: grossly tongue is normal . Neck: no obvious mass . Cardiovascular: S1 S2 normal no gallop . Respiratory: No rales or rhonchi noted . Abdomen: soft . Skin: no rash seen on limited exam . Musculoskeletal: not rigid . Psychiatric:unable to assess . Neurologic: no seizure no involuntary movements         Lab Data:   Basic Metabolic Panel: Recent Labs  Lab 07/28/18 0036 07/29/18 0449 07/30/18 0620 07/31/18 0033 08/02/18 0442  NA 135  --  137  --  140  K 3.1* 3.3* 3.3* 4.4 4.0  CL 107  --  102  --  105  CO2 18*  --  23  --  23  GLUCOSE 173*  --  142*  --  167*  BUN 30*  --  32*  --  31*  CREATININE 0.93  --  0.88  --  0.94  CALCIUM 8.9  --  9.5  --  9.7  MG 2.0  --  2.2  --  2.6*  PHOS 5.3*  --   --   --  3.3    ABG: Recent Labs  Lab 07/31/18 1600  PHART 7.457*  PCO2ART 32.2  PO2ART 106  HCO3 22.7  O2SAT 97.9    Liver Function Tests: Recent Labs  Lab 08/02/18 0442  ALBUMIN 3.0*   No results for input(s): LIPASE, AMYLASE in the last 168 hours. No results for input(s): AMMONIA in the last 168 hours.  CBC: Recent Labs  Lab 07/28/18 0036 07/30/18 0620 08/02/18 0442  WBC 16.9* 13.2*  17.2*  HGB 14.7 15.5 16.5  HCT 46.9 47.3 52.5*  MCV 88.5 88.1 88.7  PLT 341 337 427*    Cardiac Enzymes: No results for input(s): CKTOTAL, CKMB, CKMBINDEX, TROPONINI in the last 168 hours.  BNP (last 3 results) No results for input(s): BNP in the last 8760 hours.  ProBNP (last 3 results) No results for input(s): PROBNP in the last 8760 hours.  Radiological Exams: Dg Chest Port 1 View  Result Date: 08/02/2018 CLINICAL DATA:  Respiratory failure, pulmonary edema. EXAM: PORTABLE CHEST 1 VIEW COMPARISON:  Radiograph July 19, 2018. FINDINGS: Stable cardiomediastinal silhouette. Tracheostomy tube is unchanged in position. Nasogastric tube has been removed. No pneumothorax or pleural effusion is noted. No acute pulmonary disease is noted. Bony thorax is unremarkable. IMPRESSION: Tracheostomy tube in grossly good position. No acute cardiopulmonary abnormality seen. Electronically Signed   By: Lupita Raider, M.D.   On: 08/02/2018 08:06    Assessment/Plan Active Problems:   Acute on chronic respiratory failure with hypoxia (HCC)   COPD, severe (HCC)   Chronic atrial fibrillation   Acute systolic heart failure (HCC)   Lobar pneumonia, unspecified organism (HCC)  1. Acute on chronic respiratory failure with hypoxia continue weaning per protocol.  Maintaining good saturations.  Continue aggressive pulmonary toilet and secretion management. 2. Severe COPD continue present management and supportive care 3. Chronic atrial fibrillation rate controlled continue present management 4. Acute systolic heart failure at baseline 5. Lobar pneumonia treated continue supportive care   I have personally seen and evaluated the patient, evaluated laboratory and imaging results, formulated the assessment and plan and placed orders. The Patient requires high complexity decision making for assessment and support.  Case was discussed on Rounds with the Respiratory Therapy Staff  Yevonne Pax, MD  [facility] Pulmonary Critical Care Medicine Sleep Medicine

## 2018-08-04 DIAGNOSIS — J449 Chronic obstructive pulmonary disease, unspecified: Secondary | ICD-10-CM | POA: Diagnosis not present

## 2018-08-04 DIAGNOSIS — I5021 Acute systolic (congestive) heart failure: Secondary | ICD-10-CM | POA: Diagnosis not present

## 2018-08-04 DIAGNOSIS — J9621 Acute and chronic respiratory failure with hypoxia: Secondary | ICD-10-CM | POA: Diagnosis not present

## 2018-08-04 DIAGNOSIS — I482 Chronic atrial fibrillation, unspecified: Secondary | ICD-10-CM | POA: Diagnosis not present

## 2018-08-04 LAB — CBC
HCT: 48.6 % (ref 39.0–52.0)
Hemoglobin: 15.3 g/dL (ref 13.0–17.0)
MCH: 27.9 pg (ref 26.0–34.0)
MCHC: 31.5 g/dL (ref 30.0–36.0)
MCV: 88.5 fL (ref 80.0–100.0)
Platelets: 306 10*3/uL (ref 150–400)
RBC: 5.49 MIL/uL (ref 4.22–5.81)
RDW: 14.5 % (ref 11.5–15.5)
WBC: 12.2 10*3/uL — ABNORMAL HIGH (ref 4.0–10.5)
nRBC: 0 % (ref 0.0–0.2)

## 2018-08-04 LAB — COMPREHENSIVE METABOLIC PANEL
ALT: 91 U/L — ABNORMAL HIGH (ref 0–44)
AST: 82 U/L — ABNORMAL HIGH (ref 15–41)
Albumin: 2.7 g/dL — ABNORMAL LOW (ref 3.5–5.0)
Alkaline Phosphatase: 119 U/L (ref 38–126)
Anion gap: 8 (ref 5–15)
BUN: 28 mg/dL — ABNORMAL HIGH (ref 8–23)
CO2: 21 mmol/L — ABNORMAL LOW (ref 22–32)
Calcium: 9.4 mg/dL (ref 8.9–10.3)
Chloride: 115 mmol/L — ABNORMAL HIGH (ref 98–111)
Creatinine, Ser: 0.74 mg/dL (ref 0.61–1.24)
GFR calc non Af Amer: >60 mL/min (ref >60)
Glucose, Bld: 121 mg/dL — ABNORMAL HIGH (ref 70–99)
Potassium: 3.6 mmol/L (ref 3.5–5.1)
SODIUM: 144 mmol/L (ref 135–145)
Total Bilirubin: 0.4 mg/dL (ref 0.3–1.2)
Total Protein: 6.9 g/dL (ref 6.5–8.1)

## 2018-08-04 LAB — MAGNESIUM: Magnesium: 2.2 mg/dL (ref 1.7–2.4)

## 2018-08-04 LAB — PROTIME-INR
INR: 1.1 (ref 0.8–1.2)
PROTHROMBIN TIME: 14.3 s (ref 11.4–15.2)

## 2018-08-04 NOTE — Progress Notes (Addendum)
Pulmonary Critical Care Medicine [facility] [place]   PULMONARY CRITICAL CARE SERVICE  PROGRESS NOTE  Date of Service: 08/04/2018  [name] [name]  KDT:[id number]  DOB: 16-Mar-1956   DOA: 07/02/2018  Referring Physician: Carron Curie, MD  HPI: [name] [name] is a 63 y.o. male seen for follow up of Acute on Chronic Respiratory Failure.  Patient mains on aerosol trach collar 28% FiO2.  Using PMV without difficulty.  No acute distress noted at this time.  Medications: Reviewed on Rounds  Physical Exam:  Vitals: Pulse 89 respirations 40 BP 132/64 O2 sat 98% temp 98.2  Ventilator Settings not currently on ventilator  . General: Comfortable at this time . Eyes: Grossly normal lids, irises & conjunctiva . ENT: grossly tongue is normal . Neck: no obvious mass . Cardiovascular: S1 S2 normal no gallop . Respiratory: No rales or rhonchi noted . Abdomen: soft . Skin: no rash seen on limited exam . Musculoskeletal: not rigid . Psychiatric:unable to assess . Neurologic: no seizure no involuntary movements         Lab Data:   Basic Metabolic Panel: Recent Labs  Lab 07/29/18 0449 07/30/18 0620 07/31/18 0033 08/02/18 0442 08/04/18 0703  NA  --  137  --  140 144  K 3.3* 3.3* 4.4 4.0 3.6  CL  --  102  --  105 115*  CO2  --  23  --  23 21*  GLUCOSE  --  142*  --  167* 121*  BUN  --  32*  --  31* 28*  CREATININE  --  0.88  --  0.94 0.74  CALCIUM  --  9.5  --  9.7 9.4  MG  --  2.2  --  2.6* 2.2  PHOS  --   --   --  3.3  --     ABG: Recent Labs  Lab 07/31/18 1600 08/03/18 1440  PHART 7.457* 7.463*  PCO2ART 32.2 30.4*  PO2ART 106 81.5*  HCO3 22.7 21.5  O2SAT 97.9 96.0    Liver Function Tests: Recent Labs  Lab 08/02/18 0442 08/04/18 0703  AST  --  82*  ALT  --  91*  ALKPHOS  --  119  BILITOT  --  0.4  PROT  --  6.9  ALBUMIN 3.0* 2.7*   No results for input(s): LIPASE, AMYLASE in the last 168 hours. No results for input(s): AMMONIA in the last 168  hours.  CBC: Recent Labs  Lab 07/30/18 0620 08/02/18 0442 08/04/18 0703  WBC 13.2* 17.2* 12.2*  HGB 15.5 16.5 15.3  HCT 47.3 52.5* 48.6  MCV 88.1 88.7 88.5  PLT 337 427* 306    Cardiac Enzymes: No results for input(s): CKTOTAL, CKMB, CKMBINDEX, TROPONINI in the last 168 hours.  BNP (last 3 results) No results for input(s): BNP in the last 8760 hours.  ProBNP (last 3 results) No results for input(s): PROBNP in the last 8760 hours.  Radiological Exams: No results found.  Assessment/Plan Active Problems:   Acute on chronic respiratory failure with hypoxia (HCC)   COPD, severe (HCC)   Chronic atrial fibrillation   Acute systolic heart failure (HCC)   Lobar pneumonia, unspecified organism (HCC)   1. Acute on chronic respiratory failure with hypoxia continue weaning per protocol.  Patient is maintaining good saturations at this time.  Continue aggressive pulmonary toilet and secretion management. 2. Severe COPD continue present management and supportive care 3. Chronic atrial fibrillation rate controlled continue present management 4. Acute systolic heart  failure at baseline 5. Lobar pneumonia treated continue supportive care   I have personally seen and evaluated the patient, evaluated laboratory and imaging results, formulated the assessment and plan and placed orders. The Patient requires high complexity decision making for assessment and support.  Case was discussed on Rounds with the Respiratory Therapy Staff  Yevonne Pax, MD [facility] Pulmonary Critical Care Medicine Sleep Medicine

## 2018-08-05 DIAGNOSIS — I5021 Acute systolic (congestive) heart failure: Secondary | ICD-10-CM | POA: Diagnosis not present

## 2018-08-05 DIAGNOSIS — I482 Chronic atrial fibrillation, unspecified: Secondary | ICD-10-CM | POA: Diagnosis not present

## 2018-08-05 DIAGNOSIS — J449 Chronic obstructive pulmonary disease, unspecified: Secondary | ICD-10-CM | POA: Diagnosis not present

## 2018-08-05 DIAGNOSIS — J9621 Acute and chronic respiratory failure with hypoxia: Secondary | ICD-10-CM | POA: Diagnosis not present

## 2018-08-05 LAB — BASIC METABOLIC PANEL
Anion gap: 11 (ref 5–15)
BUN: 26 mg/dL — AB (ref 8–23)
CO2: 20 mmol/L — ABNORMAL LOW (ref 22–32)
Calcium: 9.8 mg/dL (ref 8.9–10.3)
Chloride: 113 mmol/L — ABNORMAL HIGH (ref 98–111)
Creatinine, Ser: 0.92 mg/dL (ref 0.61–1.24)
GFR calc Af Amer: >60 mL/min (ref >60)
GFR calc non Af Amer: >60 mL/min (ref >60)
Glucose, Bld: 144 mg/dL — ABNORMAL HIGH (ref 70–99)
Potassium: 3.6 mmol/L (ref 3.5–5.1)
Sodium: 144 mmol/L (ref 135–145)

## 2018-08-05 LAB — CBC
HCT: 49.8 % (ref 39.0–52.0)
Hemoglobin: 15.7 g/dL (ref 13.0–17.0)
MCH: 27.9 pg (ref 26.0–34.0)
MCHC: 31.5 g/dL (ref 30.0–36.0)
MCV: 88.5 fL (ref 80.0–100.0)
Platelets: 326 10*3/uL (ref 150–400)
RBC: 5.63 MIL/uL (ref 4.22–5.81)
RDW: 14.7 % (ref 11.5–15.5)
WBC: 10.2 10*3/uL (ref 4.0–10.5)
nRBC: 0 % (ref 0.0–0.2)

## 2018-08-05 LAB — MAGNESIUM: MAGNESIUM: 2.3 mg/dL (ref 1.7–2.4)

## 2018-08-05 LAB — PHOSPHORUS: Phosphorus: 4.4 mg/dL (ref 2.5–4.6)

## 2018-08-05 LAB — PROTIME-INR
INR: 1.2 (ref 0.8–1.2)
Prothrombin Time: 14.7 seconds (ref 11.4–15.2)

## 2018-08-05 NOTE — Progress Notes (Addendum)
Pulmonary Critical Care Medicine [facility] [place]   PULMONARY CRITICAL CARE SERVICE  PROGRESS NOTE  Date of Service: 08/05/2018  [name] [name]  IRW:[id number]  DOB: [birth year]   DOA: 07/02/2018  Referring Physician: Carron Curie, MD  HPI: [name] [name] is a 63 y.o. male seen for follow up of Acute on Chronic Respiratory Failure.  Patient is doing well at this time with 28% FiO2 via aerosol trach collar.  Using PMV without difficulty.  We will downsize patient's trach today to a cuffless #4.  Medications: Reviewed on Rounds  Physical Exam:  Vitals: Pulse 91 respirations 22 BP 113/64 O2 sat 90% temp 96.8  Ventilator Settings patient not currently on ventilator  . General: Comfortable at this time . Eyes: Grossly normal lids, irises & conjunctiva . ENT: grossly tongue is normal . Neck: no obvious mass . Cardiovascular: S1 S2 normal no gallop . Respiratory: No rales or rhonchi noted . Abdomen: soft . Skin: no rash seen on limited exam . Musculoskeletal: not rigid . Psychiatric:unable to assess . Neurologic: no seizure no involuntary movements         Lab Data:   Basic Metabolic Panel: Recent Labs  Lab 07/30/18 0620 07/31/18 0033 08/02/18 0442 08/04/18 0703 08/05/18 0534  NA 137  --  140 144 144  K 3.3* 4.4 4.0 3.6 3.6  CL 102  --  105 115* 113*  CO2 23  --  23 21* 20*  GLUCOSE 142*  --  167* 121* 144*  BUN 32*  --  31* 28* 26*  CREATININE 0.88  --  0.94 0.74 0.92  CALCIUM 9.5  --  9.7 9.4 9.8  MG 2.2  --  2.6* 2.2 2.3  PHOS  --   --  3.3  --  4.4    ABG: Recent Labs  Lab 07/31/18 1600 08/03/18 1440  PHART 7.457* 7.463*  PCO2ART 32.2 30.4*  PO2ART 106 81.5*  HCO3 22.7 21.5  O2SAT 97.9 96.0    Liver Function Tests: Recent Labs  Lab 08/02/18 0442 08/04/18 0703  AST  --  82*  ALT  --  91*  ALKPHOS  --  119  BILITOT  --  0.4  PROT  --  6.9  ALBUMIN 3.0* 2.7*   No results for input(s): LIPASE, AMYLASE in the last 168 hours. No  results for input(s): AMMONIA in the last 168 hours.  CBC: Recent Labs  Lab 07/30/18 0620 08/02/18 0442 08/04/18 0703 08/05/18 0534  WBC 13.2* 17.2* 12.2* 10.2  HGB 15.5 16.5 15.3 15.7  HCT 47.3 52.5* 48.6 49.8  MCV 88.1 88.7 88.5 88.5  PLT 337 427* 306 326    Cardiac Enzymes: No results for input(s): CKTOTAL, CKMB, CKMBINDEX, TROPONINI in the last 168 hours.  BNP (last 3 results) No results for input(s): BNP in the last 8760 hours.  ProBNP (last 3 results) No results for input(s): PROBNP in the last 8760 hours.  Radiological Exams: No results found.  Assessment/Plan Active Problems:   Acute on chronic respiratory failure with hypoxia (HCC)   COPD, severe (HCC)   Chronic atrial fibrillation   Acute systolic heart failure (HCC)   Lobar pneumonia, unspecified organism (HCC)   1. Acute on chronic respiratory failure with oxygen continue weaning per protocol.  Patient is to maintain good saturations and using PMV without difficulty.  We will downsize patient's trach today to #4 cuffless.  Continue pulmonary toilet and secretion management 2. Severe COPD continue his management supportive care 3. Chronic atrial  fibrillation rate controlled continue present regimen 4. Acute systolic heart failure at baseline 5. Lobar pneumonia treated continue supportive care   I have personally seen and evaluated the patient, evaluated laboratory and imaging results, formulated the assessment and plan and placed orders. The Patient requires high complexity decision making for assessment and support.  Case was discussed on Rounds with the Respiratory Therapy Staff  Yevonne Pax, MD [facility] Pulmonary Critical Care Medicine Sleep Medicine

## 2018-08-06 DIAGNOSIS — J449 Chronic obstructive pulmonary disease, unspecified: Secondary | ICD-10-CM | POA: Diagnosis not present

## 2018-08-06 DIAGNOSIS — I5021 Acute systolic (congestive) heart failure: Secondary | ICD-10-CM | POA: Diagnosis not present

## 2018-08-06 DIAGNOSIS — J9621 Acute and chronic respiratory failure with hypoxia: Secondary | ICD-10-CM | POA: Diagnosis not present

## 2018-08-06 DIAGNOSIS — I482 Chronic atrial fibrillation, unspecified: Secondary | ICD-10-CM | POA: Diagnosis not present

## 2018-08-06 LAB — TRIGLYCERIDES: Triglycerides: 211 mg/dL — ABNORMAL HIGH (ref <150)

## 2018-08-06 LAB — PROTIME-INR
INR: 1.1 (ref 0.8–1.2)
Prothrombin Time: 14.2 seconds (ref 11.4–15.2)

## 2018-08-06 NOTE — Progress Notes (Addendum)
Pulmonary Critical Care Medicine [facility] [place]   PULMONARY CRITICAL CARE SERVICE  PROGRESS NOTE  Date of Service: 08/06/2018  Clearance Christe  WUJ:[id number]  DOB: [birth year]   DOA: 07/02/2018  Referring Physician: Carron Curie, MD  HPI: [name] [name] is a 63 y.o. male seen for follow up of Acute on Chronic Respiratory Failure.  Patient needs to do well with aerosol trach collar 28% FiO2.  His trach was downsized to a #4 cuffless yesterday and he has done well with that.  He is using PMV with no difficulty at this time.  Medications: Reviewed on Rounds  Physical Exam:  Vitals: Pulse 116 respirations 22 BP 97/82 O2 sat 96% temp 98.0  Ventilator Settings not currently on ventilator  . General: Comfortable at this time . Eyes: Grossly normal lids, irises & conjunctiva . ENT: grossly tongue is normal . Neck: no obvious mass . Cardiovascular: S1 S2 normal no gallop . Respiratory: No rales or rhonchi noted . Abdomen: soft . Skin: no rash seen on limited exam . Musculoskeletal: not rigid . Psychiatric:unable to assess . Neurologic: no seizure no involuntary movements         Lab Data:   Basic Metabolic Panel: Recent Labs  Lab 07/31/18 0033 08/02/18 0442 08/04/18 0703 08/05/18 0534  NA  --  140 144 144  K 4.4 4.0 3.6 3.6  CL  --  105 115* 113*  CO2  --  23 21* 20*  GLUCOSE  --  167* 121* 144*  BUN  --  31* 28* 26*  CREATININE  --  0.94 0.74 0.92  CALCIUM  --  9.7 9.4 9.8  MG  --  2.6* 2.2 2.3  PHOS  --  3.3  --  4.4    ABG: Recent Labs  Lab 07/31/18 1600 08/03/18 1440  PHART 7.457* 7.463*  PCO2ART 32.2 30.4*  PO2ART 106 81.5*  HCO3 22.7 21.5  O2SAT 97.9 96.0    Liver Function Tests: Recent Labs  Lab 08/02/18 0442 08/04/18 0703  AST  --  82*  ALT  --  91*  ALKPHOS  --  119  BILITOT  --  0.4  PROT  --  6.9  ALBUMIN 3.0* 2.7*   No results for input(s): LIPASE, AMYLASE in the last 168 hours. No results for input(s): AMMONIA in the  last 168 hours.  CBC: Recent Labs  Lab 08/02/18 0442 08/04/18 0703 08/05/18 0534  WBC 17.2* 12.2* 10.2  HGB 16.5 15.3 15.7  HCT 52.5* 48.6 49.8  MCV 88.7 88.5 88.5  PLT 427* 306 326    Cardiac Enzymes: No results for input(s): CKTOTAL, CKMB, CKMBINDEX, TROPONINI in the last 168 hours.  BNP (last 3 results) No results for input(s): BNP in the last 8760 hours.  ProBNP (last 3 results) No results for input(s): PROBNP in the last 8760 hours.  Radiological Exams: No results found.  Assessment/Plan Active Problems:   Acute on chronic respiratory failure with hypoxia (HCC)   COPD, severe (HCC)   Chronic atrial fibrillation   Acute systolic heart failure (HCC)   Lobar pneumonia, unspecified organism (HCC)   1. Acute on chronic respiratory failure with hypoxia continue to wean oxygen per protocol.  Patient had maintained good sats and using PMV.  His trach was downsized to #4 cuffless yesterday without difficulty.  Continue aggressive pulmonary toilet and secretion management. 2. Severe COPD continue set of measures 3. Chronic atrial fibrillation rate controlled continue present management 4. Acute systolic heart failure at baseline  5. Lobar pneumonia treated continue supportive care   I have personally seen and evaluated the patient, evaluated laboratory and imaging results, formulated the assessment and plan and placed orders. The Patient requires high complexity decision making for assessment and support.  Case was discussed on Rounds with the Respiratory Therapy Staff  Yevonne Pax, MD [facility] Pulmonary Critical Care Medicine Sleep Medicine

## 2018-08-07 DIAGNOSIS — I482 Chronic atrial fibrillation, unspecified: Secondary | ICD-10-CM | POA: Diagnosis not present

## 2018-08-07 DIAGNOSIS — J449 Chronic obstructive pulmonary disease, unspecified: Secondary | ICD-10-CM | POA: Diagnosis not present

## 2018-08-07 DIAGNOSIS — I5021 Acute systolic (congestive) heart failure: Secondary | ICD-10-CM | POA: Diagnosis not present

## 2018-08-07 DIAGNOSIS — J9621 Acute and chronic respiratory failure with hypoxia: Secondary | ICD-10-CM | POA: Diagnosis not present

## 2018-08-07 LAB — BASIC METABOLIC PANEL
Anion gap: 7 (ref 5–15)
BUN: 28 mg/dL — ABNORMAL HIGH (ref 8–23)
CO2: 22 mmol/L (ref 22–32)
Calcium: 9.7 mg/dL (ref 8.9–10.3)
Chloride: 113 mmol/L — ABNORMAL HIGH (ref 98–111)
Creatinine, Ser: 0.86 mg/dL (ref 0.61–1.24)
GFR calc Af Amer: >60 mL/min (ref >60)
GFR calc non Af Amer: >60 mL/min (ref >60)
Glucose, Bld: 128 mg/dL — ABNORMAL HIGH (ref 70–99)
Potassium: 3.8 mmol/L (ref 3.5–5.1)
Sodium: 142 mmol/L (ref 135–145)

## 2018-08-07 LAB — MAGNESIUM: Magnesium: 2.2 mg/dL (ref 1.7–2.4)

## 2018-08-07 LAB — PHOSPHORUS: Phosphorus: 4.4 mg/dL (ref 2.5–4.6)

## 2018-08-07 LAB — CBC
HCT: 50 % (ref 39.0–52.0)
Hemoglobin: 16.3 g/dL (ref 13.0–17.0)
MCH: 28.7 pg (ref 26.0–34.0)
MCHC: 32.6 g/dL (ref 30.0–36.0)
MCV: 88.2 fL (ref 80.0–100.0)
Platelets: 263 10*3/uL (ref 150–400)
RBC: 5.67 MIL/uL (ref 4.22–5.81)
RDW: 14.8 % (ref 11.5–15.5)
WBC: 9.9 10*3/uL (ref 4.0–10.5)
nRBC: 0 % (ref 0.0–0.2)

## 2018-08-07 LAB — VANCOMYCIN, TROUGH: Vancomycin Tr: <4 ug/mL — ABNORMAL LOW (ref 15–20)

## 2018-08-07 LAB — PROTIME-INR
INR: 1.5 — ABNORMAL HIGH (ref 0.8–1.2)
Prothrombin Time: 17.4 seconds — ABNORMAL HIGH (ref 11.4–15.2)

## 2018-08-07 NOTE — Progress Notes (Signed)
Pulmonary Critical Care Medicine [facility] [place]   PULMONARY CRITICAL CARE SERVICE  PROGRESS NOTE  Date of Service: 08/07/2018  Abdullatif Turrentine  BXI:[id number]  DOB: [birth year]   DOA: 07/02/2018  Referring Physician: Carron Curie, MD  HPI: Dawton Haggstrom is a 63 y.o. male seen for follow up of Acute on Chronic Respiratory Failure.  Patient is on T collar has been on 28% FiO2 using PMV doing relatively well.  Medications: Reviewed on Rounds  Physical Exam:  Vitals: Temperature 96.9 pulse 59 respiratory 27 blood pressure 130/66 saturations 100%  Ventilator Settings off the ventilator on T collar  . General: Comfortable at this time . Eyes: Grossly normal lids, irises & conjunctiva . ENT: grossly tongue is normal . Neck: no obvious mass . Cardiovascular: S1 S2 normal no gallop . Respiratory: No rhonchi or rales are noted at this time . Abdomen: soft . Skin: no rash seen on limited exam . Musculoskeletal: not rigid . Psychiatric:unable to assess . Neurologic: no seizure no involuntary movements         Lab Data:   Basic Metabolic Panel: Recent Labs  Lab 08/02/18 0442 08/04/18 0703 08/05/18 0534 08/07/18 0702  NA 140 144 144 142  K 4.0 3.6 3.6 3.8  CL 105 115* 113* 113*  CO2 23 21* 20* 22  GLUCOSE 167* 121* 144* 128*  BUN 31* 28* 26* 28*  CREATININE 0.94 0.74 0.92 0.86  CALCIUM 9.7 9.4 9.8 9.7  MG 2.6* 2.2 2.3 2.2  PHOS 3.3  --  4.4 4.4    ABG: Recent Labs  Lab 07/31/18 1600 08/03/18 1440  PHART 7.457* 7.463*  PCO2ART 32.2 30.4*  PO2ART 106 81.5*  HCO3 22.7 21.5  O2SAT 97.9 96.0    Liver Function Tests: Recent Labs  Lab 08/02/18 0442 08/04/18 0703  AST  --  82*  ALT  --  91*  ALKPHOS  --  119  BILITOT  --  0.4  PROT  --  6.9  ALBUMIN 3.0* 2.7*   No results for input(s): LIPASE, AMYLASE in the last 168 hours. No results for input(s): AMMONIA in the last 168 hours.  CBC: Recent Labs  Lab 08/02/18 0442 08/04/18 0703  08/05/18 0534 08/07/18 0702  WBC 17.2* 12.2* 10.2 9.9  HGB 16.5 15.3 15.7 16.3  HCT 52.5* 48.6 49.8 50.0  MCV 88.7 88.5 88.5 88.2  PLT 427* 306 326 263    Cardiac Enzymes: No results for input(s): CKTOTAL, CKMB, CKMBINDEX, TROPONINI in the last 168 hours.  BNP (last 3 results) No results for input(s): BNP in the last 8760 hours.  ProBNP (last 3 results) No results for input(s): PROBNP in the last 8760 hours.  Radiological Exams: No results found.  Assessment/Plan Active Problems:   Acute on chronic respiratory failure with hypoxia (HCC)   COPD, severe (HCC)   Chronic atrial fibrillation   Acute systolic heart failure (HCC)   Lobar pneumonia, unspecified organism (HCC)   1. Acute on chronic respiratory failure hypoxia we will advance the wean try to do capping trials today 2. Severe COPD at baseline 3. Chronic atrial fibrillation rate controlled 4. Acute systolic heart failure grossly unchanged 5. Lobar pneumonia treated we will continue with supportive care follow radiologically   I have personally seen and evaluated the patient, evaluated laboratory and imaging results, formulated the assessment and plan and placed orders. The Patient requires high complexity decision making for assessment and support.  Case was discussed on Rounds with the Respiratory Therapy Staff  Allyne Gee, MD [facility] Pulmonary Critical Care Medicine Sleep Medicine

## 2018-08-08 DIAGNOSIS — I482 Chronic atrial fibrillation, unspecified: Secondary | ICD-10-CM | POA: Diagnosis not present

## 2018-08-08 DIAGNOSIS — J9621 Acute and chronic respiratory failure with hypoxia: Secondary | ICD-10-CM | POA: Diagnosis not present

## 2018-08-08 DIAGNOSIS — I5021 Acute systolic (congestive) heart failure: Secondary | ICD-10-CM | POA: Diagnosis not present

## 2018-08-08 DIAGNOSIS — J449 Chronic obstructive pulmonary disease, unspecified: Secondary | ICD-10-CM | POA: Diagnosis not present

## 2018-08-08 LAB — PROTIME-INR
INR: 2 — ABNORMAL HIGH (ref 0.8–1.2)
Prothrombin Time: 22.4 seconds — ABNORMAL HIGH (ref 11.4–15.2)

## 2018-08-08 NOTE — Progress Notes (Addendum)
Pulmonary Critical Care Medicine [facility] [place]   PULMONARY CRITICAL CARE SERVICE  PROGRESS NOTE  Date of Service: 08/08/2018  [name] [name]  VHQ:[id number]  DOB: 09-14-1955   DOA: 07/02/2018  Referring Physician: Carron Curie, MD  HPI: [name] [name] is a 63 y.o. male seen for follow up of Acute on Chronic Respiratory Failure.  As of 4 PM today patient will have been capped for 24 hours.  Currently doing well with minimal secretions at this time.  Medications: Reviewed on Rounds  Physical Exam:  Vitals: Pulse 62 respirations 20 BP 101/73 O2 sat 97% temp 97.5  Ventilator Settings not currently on ventilator  . General: Comfortable at this time . Eyes: Grossly normal lids, irises & conjunctiva . ENT: grossly tongue is normal . Neck: no obvious mass . Cardiovascular: S1 S2 normal no gallop . Respiratory: No rales or rhonchi noted . Abdomen: soft . Skin: no rash seen on limited exam . Musculoskeletal: not rigid . Psychiatric:unable to assess . Neurologic: no seizure no involuntary movements         Lab Data:   Basic Metabolic Panel: Recent Labs  Lab 08/02/18 0442 08/04/18 0703 08/05/18 0534 08/07/18 0702  NA 140 144 144 142  K 4.0 3.6 3.6 3.8  CL 105 115* 113* 113*  CO2 23 21* 20* 22  GLUCOSE 167* 121* 144* 128*  BUN 31* 28* 26* 28*  CREATININE 0.94 0.74 0.92 0.86  CALCIUM 9.7 9.4 9.8 9.7  MG 2.6* 2.2 2.3 2.2  PHOS 3.3  --  4.4 4.4    ABG: Recent Labs  Lab 08/03/18 1440  PHART 7.463*  PCO2ART 30.4*  PO2ART 81.5*  HCO3 21.5  O2SAT 96.0    Liver Function Tests: Recent Labs  Lab 08/02/18 0442 08/04/18 0703  AST  --  82*  ALT  --  91*  ALKPHOS  --  119  BILITOT  --  0.4  PROT  --  6.9  ALBUMIN 3.0* 2.7*   No results for input(s): LIPASE, AMYLASE in the last 168 hours. No results for input(s): AMMONIA in the last 168 hours.  CBC: Recent Labs  Lab 08/02/18 0442 08/04/18 0703 08/05/18 0534 08/07/18 0702  WBC 17.2* 12.2*  10.2 9.9  HGB 16.5 15.3 15.7 16.3  HCT 52.5* 48.6 49.8 50.0  MCV 88.7 88.5 88.5 88.2  PLT 427* 306 326 263    Cardiac Enzymes: No results for input(s): CKTOTAL, CKMB, CKMBINDEX, TROPONINI in the last 168 hours.  BNP (last 3 results) No results for input(s): BNP in the last 8760 hours.  ProBNP (last 3 results) No results for input(s): PROBNP in the last 8760 hours.  Radiological Exams: No results found.  Assessment/Plan Active Problems:   Acute on chronic respiratory failure with hypoxia (HCC)   COPD, severe (HCC)   Chronic atrial fibrillation   Acute systolic heart failure (HCC)   Lobar pneumonia, unspecified organism (HCC)   1. Acute on chronic respiratory failure with hypoxia continue capping as tolerated. 2. Severe COPD + 3. Chronic atrial fibrillation rate controlled 4. Acute systolic heart failure grossly unchanged 5. Pneumonia treated continue supportive care   I have personally seen and evaluated the patient, evaluated laboratory and imaging results, formulated the assessment and plan and placed orders. The Patient requires high complexity decision making for assessment and support.  Case was discussed on Rounds with the Respiratory Therapy Staff  Yevonne Pax, MD [facility] Pulmonary Critical Care Medicine Sleep Medicine

## 2018-08-09 DIAGNOSIS — J9621 Acute and chronic respiratory failure with hypoxia: Secondary | ICD-10-CM | POA: Diagnosis not present

## 2018-08-09 DIAGNOSIS — I482 Chronic atrial fibrillation, unspecified: Secondary | ICD-10-CM | POA: Diagnosis not present

## 2018-08-09 DIAGNOSIS — I5021 Acute systolic (congestive) heart failure: Secondary | ICD-10-CM | POA: Diagnosis not present

## 2018-08-09 DIAGNOSIS — J449 Chronic obstructive pulmonary disease, unspecified: Secondary | ICD-10-CM | POA: Diagnosis not present

## 2018-08-09 LAB — PROTIME-INR
INR: 2.2 — ABNORMAL HIGH (ref 0.8–1.2)
Prothrombin Time: 24 seconds — ABNORMAL HIGH (ref 11.4–15.2)

## 2018-08-09 LAB — VANCOMYCIN, TROUGH: Vancomycin Tr: <4 ug/mL — ABNORMAL LOW (ref 15–20)

## 2018-08-09 LAB — TRIGLYCERIDES: Triglycerides: 205 mg/dL — ABNORMAL HIGH (ref <150)

## 2018-08-09 NOTE — Progress Notes (Addendum)
Pulmonary Critical Care Medicine [facility] [place]   PULMONARY CRITICAL CARE SERVICE  PROGRESS NOTE  Date of Service: 08/09/2018  [name] [name]  [id number]:[id number]  DOB: [birth year]   DOA: 07/02/2018  Referring Physician: Carron Curie, MD  HPI: [name] [name] is a 63 y.o. male seen for follow up of Acute on Chronic Respiratory Failure.  Patient remains capped at this time on room air.  Has been 48 hours and is doing well.  Medications: Reviewed on Rounds  Physical Exam:  Vitals: Pulse 70 respirations 20 BP 12/81 O2 sat 97% temp 97.1  Ventilator Settings not currently on ventilator  . General: Comfortable at this time . Eyes: Grossly normal lids, irises & conjunctiva . ENT: grossly tongue is normal . Neck: no obvious mass . Cardiovascular: S1 S2 normal no gallop . Respiratory: No rales or rhonchi noted . Abdomen: soft . Skin: no rash seen on limited exam . Musculoskeletal: not rigid . Psychiatric:unable to assess . Neurologic: no seizure no involuntary movements         Lab Data:   Basic Metabolic Panel: Recent Labs  Lab 08/04/18 0703 08/05/18 0534 08/07/18 0702  NA 144 144 142  K 3.6 3.6 3.8  CL 115* 113* 113*  CO2 21* 20* 22  GLUCOSE 121* 144* 128*  BUN 28* 26* 28*  CREATININE 0.74 0.92 0.86  CALCIUM 9.4 9.8 9.7  MG 2.2 2.3 2.2  PHOS  --  4.4 4.4    ABG: Recent Labs  Lab 08/03/18 1440  PHART 7.463*  PCO2ART 30.4*  PO2ART 81.5*  HCO3 21.5  O2SAT 96.0    Liver Function Tests: Recent Labs  Lab 08/04/18 0703  AST 82*  ALT 91*  ALKPHOS 119  BILITOT 0.4  PROT 6.9  ALBUMIN 2.7*   No results for input(s): LIPASE, AMYLASE in the last 168 hours. No results for input(s): AMMONIA in the last 168 hours.  CBC: Recent Labs  Lab 08/04/18 0703 08/05/18 0534 08/07/18 0702  WBC 12.2* 10.2 9.9  HGB 15.3 15.7 16.3  HCT 48.6 49.8 50.0  MCV 88.5 88.5 88.2  PLT 306 326 263    Cardiac Enzymes: No results for input(s): CKTOTAL, CKMB,  CKMBINDEX, TROPONINI in the last 168 hours.  BNP (last 3 results) No results for input(s): BNP in the last 8760 hours.  ProBNP (last 3 results) No results for input(s): PROBNP in the last 8760 hours.  Radiological Exams: No results found.  Assessment/Plan Active Problems:   Acute on chronic respiratory failure with hypoxia (HCC)   COPD, severe (HCC)   Chronic atrial fibrillation   Acute systolic heart failure (HCC)   Lobar pneumonia, unspecified organism (HCC)   1. Acute on chronic respiratory failure with hypoxia continue capping trials as tolerated.  Patient has been 48 hours at this time. 2. Severe COPD at baseline 3. Chronic atrial fibrillation rate controlled 4. Acute systolic heart failure grossly unchanged 5. Pneumonia treated continue supportive care   I have personally seen and evaluated the patient, evaluated laboratory and imaging results, formulated the assessment and plan and placed orders. The Patient requires high complexity decision making for assessment and support.  Case was discussed on Rounds with the Respiratory Therapy Staff  Yevonne Pax, MD [facility] Pulmonary Critical Care Medicine Sleep Medicine

## 2018-08-10 DIAGNOSIS — J9621 Acute and chronic respiratory failure with hypoxia: Secondary | ICD-10-CM | POA: Diagnosis not present

## 2018-08-10 DIAGNOSIS — J449 Chronic obstructive pulmonary disease, unspecified: Secondary | ICD-10-CM | POA: Diagnosis not present

## 2018-08-10 DIAGNOSIS — I482 Chronic atrial fibrillation, unspecified: Secondary | ICD-10-CM | POA: Diagnosis not present

## 2018-08-10 DIAGNOSIS — I5021 Acute systolic (congestive) heart failure: Secondary | ICD-10-CM | POA: Diagnosis not present

## 2018-08-10 LAB — BASIC METABOLIC PANEL
Anion gap: 8 (ref 5–15)
BUN: 33 mg/dL — ABNORMAL HIGH (ref 8–23)
CO2: 23 mmol/L (ref 22–32)
Calcium: 10.3 mg/dL (ref 8.9–10.3)
Chloride: 114 mmol/L — ABNORMAL HIGH (ref 98–111)
Creatinine, Ser: 0.78 mg/dL (ref 0.61–1.24)
GFR calc Af Amer: >60 mL/min (ref >60)
GFR calc non Af Amer: >60 mL/min (ref >60)
Glucose, Bld: 139 mg/dL — ABNORMAL HIGH (ref 70–99)
Potassium: 3.9 mmol/L (ref 3.5–5.1)
Sodium: 145 mmol/L (ref 135–145)

## 2018-08-10 LAB — CBC
HCT: 54.4 % — ABNORMAL HIGH (ref 39.0–52.0)
Hemoglobin: 17 g/dL (ref 13.0–17.0)
MCH: 27.6 pg (ref 26.0–34.0)
MCHC: 31.3 g/dL (ref 30.0–36.0)
MCV: 88.5 fL (ref 80.0–100.0)
Platelets: 253 10*3/uL (ref 150–400)
RBC: 6.15 MIL/uL — ABNORMAL HIGH (ref 4.22–5.81)
RDW: 15.8 % — ABNORMAL HIGH (ref 11.5–15.5)
WBC: 12.3 10*3/uL — ABNORMAL HIGH (ref 4.0–10.5)
nRBC: 0 % (ref 0.0–0.2)

## 2018-08-10 LAB — PHOSPHORUS: Phosphorus: 4.1 mg/dL (ref 2.5–4.6)

## 2018-08-10 LAB — MAGNESIUM: Magnesium: 2.4 mg/dL (ref 1.7–2.4)

## 2018-08-10 LAB — PROTIME-INR
INR: 2.3 — ABNORMAL HIGH (ref 0.8–1.2)
Prothrombin Time: 25.1 seconds — ABNORMAL HIGH (ref 11.4–15.2)

## 2018-08-10 NOTE — Progress Notes (Addendum)
Pulmonary Critical Care Medicine [facility] [place]   PULMONARY CRITICAL CARE SERVICE  PROGRESS NOTE  Date of Service: 08/10/2018  [name] [name]  LPF:[id number]  DOB: 1955/11/26   DOA: 07/02/2018  Referring Physician: Carron Curie, MD  HPI: [name] [name] is a 63 y.o. male seen for follow up of Acute on Chronic Respiratory Failure.  Patient remains capped on room air at this time.  Overall is doing well with no distress noted.  Medications: Reviewed on Rounds  Physical Exam:  Vitals: Pulse 96 respirations 2080 135/90 O2 sat 99% temp 98.0  Ventilator Settings currently on ventilator  . General: Comfortable at this time . Eyes: Grossly normal lids, irises & conjunctiva . ENT: grossly tongue is normal . Neck: no obvious mass . Cardiovascular: S1 S2 normal no gallop . Respiratory: No rales or rhonchi noted . Abdomen: soft . Skin: no rash seen on limited exam . Musculoskeletal: not rigid . Psychiatric:unable to assess . Neurologic: no seizure no involuntary movements         Lab Data:   Basic Metabolic Panel: Recent Labs  Lab 08/05/18 0534 08/07/18 0702 08/10/18 0639  NA 144 142 145  K 3.6 3.8 3.9  CL 113* 113* 114*  CO2 20* 22 23  GLUCOSE 144* 128* 139*  BUN 26* 28* 33*  CREATININE 0.92 0.86 0.78  CALCIUM 9.8 9.7 10.3  MG 2.3 2.2 2.4  PHOS 4.4 4.4 4.1    ABG: No results for input(s): PHART, PCO2ART, PO2ART, HCO3, O2SAT in the last 168 hours.  Liver Function Tests: No results for input(s): AST, ALT, ALKPHOS, BILITOT, PROT, ALBUMIN in the last 168 hours. No results for input(s): LIPASE, AMYLASE in the last 168 hours. No results for input(s): AMMONIA in the last 168 hours.  CBC: Recent Labs  Lab 08/05/18 0534 08/07/18 0702 08/10/18 0639  WBC 10.2 9.9 12.3*  HGB 15.7 16.3 17.0  HCT 49.8 50.0 54.4*  MCV 88.5 88.2 88.5  PLT 326 263 253    Cardiac Enzymes: No results for input(s): CKTOTAL, CKMB, CKMBINDEX, TROPONINI in the last 168  hours.  BNP (last 3 results) No results for input(s): BNP in the last 8760 hours.  ProBNP (last 3 results) No results for input(s): PROBNP in the last 8760 hours.  Radiological Exams: No results found.  Assessment/Plan Active Problems:   Acute on chronic respiratory failure with hypoxia (HCC)   COPD, severe (HCC)   Chronic atrial fibrillation   Acute systolic heart failure (HCC)   Lobar pneumonia, unspecified organism (HCC)   1. Acute on chronic respiratory failure with hypoxia continue capping trials as tolerated.  Patient has now been 72 hours on room air without Felty. 2. Severe COPD at baseline 3. Chronic atrial fibrillation rate controlled 4. Acute systolic heart failure grossly unchanged 5. Pneumonia treated continue supportive measures   I have personally seen and evaluated the patient, evaluated laboratory and imaging results, formulated the assessment and plan and placed orders. The Patient requires high complexity decision making for assessment and support.  Case was discussed on Rounds with the Respiratory Therapy Staff  Yevonne Pax, MD [facility] Pulmonary Critical Care Medicine Sleep Medicine

## 2018-08-11 ENCOUNTER — Other Ambulatory Visit (HOSPITAL_COMMUNITY): Payer: Medicare Other

## 2018-08-11 ENCOUNTER — Encounter (HOSPITAL_COMMUNITY): Payer: Self-pay | Admitting: Interventional Radiology

## 2018-08-11 DIAGNOSIS — J9621 Acute and chronic respiratory failure with hypoxia: Secondary | ICD-10-CM | POA: Diagnosis not present

## 2018-08-11 DIAGNOSIS — I5021 Acute systolic (congestive) heart failure: Secondary | ICD-10-CM | POA: Diagnosis not present

## 2018-08-11 DIAGNOSIS — J449 Chronic obstructive pulmonary disease, unspecified: Secondary | ICD-10-CM | POA: Diagnosis not present

## 2018-08-11 DIAGNOSIS — I482 Chronic atrial fibrillation, unspecified: Secondary | ICD-10-CM | POA: Diagnosis not present

## 2018-08-11 LAB — COMPREHENSIVE METABOLIC PANEL
ALT: 133 U/L — ABNORMAL HIGH (ref 0–44)
AST: 114 U/L — ABNORMAL HIGH (ref 15–41)
Albumin: 3.5 g/dL (ref 3.5–5.0)
Alkaline Phosphatase: 141 U/L — ABNORMAL HIGH (ref 38–126)
Anion gap: 8 (ref 5–15)
BUN: 38 mg/dL — ABNORMAL HIGH (ref 8–23)
CO2: 22 mmol/L (ref 22–32)
Calcium: 10.2 mg/dL (ref 8.9–10.3)
Chloride: 119 mmol/L — ABNORMAL HIGH (ref 98–111)
Creatinine, Ser: 1.04 mg/dL (ref 0.61–1.24)
GFR calc Af Amer: >60 mL/min (ref >60)
GFR calc non Af Amer: >60 mL/min (ref >60)
Glucose, Bld: 190 mg/dL — ABNORMAL HIGH (ref 70–99)
Potassium: 3.9 mmol/L (ref 3.5–5.1)
Sodium: 149 mmol/L — ABNORMAL HIGH (ref 135–145)
Total Bilirubin: 0.6 mg/dL (ref 0.3–1.2)
Total Protein: 8.2 g/dL — ABNORMAL HIGH (ref 6.5–8.1)

## 2018-08-11 LAB — CBC
HCT: 60.9 % — ABNORMAL HIGH (ref 39.0–52.0)
Hemoglobin: 19.1 g/dL — ABNORMAL HIGH (ref 13.0–17.0)
MCH: 27.9 pg (ref 26.0–34.0)
MCHC: 31.4 g/dL (ref 30.0–36.0)
MCV: 89 fL (ref 80.0–100.0)
Platelets: 261 10*3/uL (ref 150–400)
RBC: 6.84 MIL/uL — ABNORMAL HIGH (ref 4.22–5.81)
RDW: 17.1 % — ABNORMAL HIGH (ref 11.5–15.5)
WBC: 12.5 10*3/uL — ABNORMAL HIGH (ref 4.0–10.5)
nRBC: 0 % (ref 0.0–0.2)

## 2018-08-11 LAB — URINALYSIS, ROUTINE W REFLEX MICROSCOPIC
Bilirubin Urine: NEGATIVE
Glucose, UA: NEGATIVE mg/dL
Ketones, ur: NEGATIVE mg/dL
Nitrite: NEGATIVE
Protein, ur: 30 mg/dL — AB
RBC / HPF: >50 RBC/hpf — ABNORMAL HIGH (ref 0–5)
Specific Gravity, Urine: 1.02 (ref 1.005–1.030)
pH: 7 (ref 5.0–8.0)

## 2018-08-11 LAB — PROTIME-INR
INR: 2.5 — ABNORMAL HIGH (ref 0.8–1.2)
Prothrombin Time: 26.3 seconds — ABNORMAL HIGH (ref 11.4–15.2)

## 2018-08-11 LAB — LACTIC ACID, PLASMA: Lactic Acid, Venous: 2.1 mmol/L (ref 0.5–1.9)

## 2018-08-11 NOTE — Progress Notes (Addendum)
Pulmonary Critical Care Medicine [facility] [place]   PULMONARY CRITICAL CARE SERVICE  PROGRESS NOTE  Date of Service: 08/11/2018  Kiya Elek  UEK:[id number]  DOB: 03-17-1956   DOA: 07/02/2018  Referring Physician: Carron Curie, MD  HPI: Chief [name] [name] is a 63 y.o. male seen for follow up of Acute on Chronic Respiratory Failure.  Patient has been capped for 4 days on room air.  Overall patient is doing well.  There is some talk of inpatient rehab evaluation however no official orders have come through at this time.  Medications: Reviewed on Rounds  Physical Exam:  Vitals: Pulse 70 respirations 30 BP 120/78 O2 sat 93% temp 97.4  Ventilator Settings not currently on ventilator  . General: Comfortable at this time . Eyes: Grossly normal lids, irises & conjunctiva . ENT: grossly tongue is normal . Neck: no obvious mass . Cardiovascular: S1 S2 normal no gallop . Respiratory: No rales or rhonchi noted . Abdomen: soft . Skin: no rash seen on limited exam . Musculoskeletal: not rigid . Psychiatric:unable to assess . Neurologic: no seizure no involuntary movements         Lab Data:   Basic Metabolic Panel: Recent Labs  Lab 08/05/18 0534 08/07/18 0702 08/10/18 0639  NA 144 142 145  K 3.6 3.8 3.9  CL 113* 113* 114*  CO2 20* 22 23  GLUCOSE 144* 128* 139*  BUN 26* 28* 33*  CREATININE 0.92 0.86 0.78  CALCIUM 9.8 9.7 10.3  MG 2.3 2.2 2.4  PHOS 4.4 4.4 4.1    ABG: No results for input(s): PHART, PCO2ART, PO2ART, HCO3, O2SAT in the last 168 hours.  Liver Function Tests: No results for input(s): AST, ALT, ALKPHOS, BILITOT, PROT, ALBUMIN in the last 168 hours. No results for input(s): LIPASE, AMYLASE in the last 168 hours. No results for input(s): AMMONIA in the last 168 hours.  CBC: Recent Labs  Lab 08/05/18 0534 08/07/18 0702 08/10/18 0639  WBC 10.2 9.9 12.3*  HGB 15.7 16.3 17.0  HCT 49.8 50.0 54.4*  MCV 88.5 88.2 88.5  PLT 326 263 253     Cardiac Enzymes: No results for input(s): CKTOTAL, CKMB, CKMBINDEX, TROPONINI in the last 168 hours.  BNP (last 3 results) No results for input(s): BNP in the last 8760 hours.  ProBNP (last 3 results) No results for input(s): PROBNP in the last 8760 hours.  Radiological Exams: Dg Chest Port 1 View  Result Date: 08/11/2018 CLINICAL DATA:  Shortness of breath EXAM: PORTABLE CHEST 1 VIEW COMPARISON:  August 02, 2018 FINDINGS: Tracheostomy catheter tip is 6.4 cm above the carina. No pneumothorax. No edema consolidation. Heart size and pulmonary vascularity are normal. No adenopathy. There is aortic atherosclerosis. No bone lesions. IMPRESSION: Tracheostomy as described without pneumothorax. No edema or consolidation. Heart size normal. Aortic Atherosclerosis (ICD10-I70.0). Electronically Signed   By: Bretta Bang III M.D.   On: 08/11/2018 16:10    Assessment/Plan Active Problems:   Acute on chronic respiratory failure with hypoxia (HCC)   COPD, severe (HCC)   Chronic atrial fibrillation   Acute systolic heart failure (HCC)   Lobar pneumonia, unspecified organism (HCC)   1. Acute on chronic respiratory failure with hypoxia continue capping trials as tolerated.  Patient has now been 96 hours on room air without any difficulty.  Working on inpatient rehab basement. 2. Severe COPD at baseline 3. Chronic atrial fibrillation rate controlled 4. Acute systolic heart failure grossly unchanged 5. Pneumonia treated continue supportive measures   I have  personally seen and evaluated the patient, evaluated laboratory and imaging results, formulated the assessment and plan and placed orders. The Patient requires high complexity decision making for assessment and support.  Case was discussed on Rounds with the Respiratory Therapy Staff  Allyne Gee, MD [facility] Pulmonary Critical Care Medicine Sleep Medicine

## 2018-08-11 NOTE — Consult Note (Signed)
Ref: Sharlene Dory, DO   Subjective:  Tachycardic with atrial flutter and h/o MV endocarditis + UTI and chronic respiratory failure. Urine in foley cath bag appears concentrated. HR in 140's to 150's with amiodarone, diltiazem, metoprolol and lanoxin use.  Objective:  Vital Signs in the last 24 hours:    Physical Exam: BP Readings from Last 1 Encounters:  07/22/18 98/83     Wt Readings from Last 1 Encounters:  05/17/15 78.2 kg    Weight change:  There is no height or weight on file to calculate BMI. HEENT: Time/AT, Eyes- Conjunctiva-Pink, Sclera-Non-icteric Neck: No JVD, No bruit, Trachea midline. Lungs:  Clearing, Bilateral. Cardiac:  Regular rhythm, normal S1 and S2, no S3. II/VI systolic murmur. Abdomen:  Soft, non-tender. BS present. Extremities:  No edema present. No cyanosis. No clubbing. Bilateral AKA. CNS: AxOx3, Cranial nerves grossly intact, moves all 4 extremities.  Skin: Warm and dry.   Intake/Output from previous day: No intake/output data recorded.    Lab Results: BMET    Component Value Date/Time   NA 145 08/10/2018 0639   NA 142 08/07/2018 0702   NA 144 08/05/2018 0534   K 3.9 08/10/2018 0639   K 3.8 08/07/2018 0702   K 3.6 08/05/2018 0534   CL 114 (H) 08/10/2018 0639   CL 113 (H) 08/07/2018 0702   CL 113 (H) 08/05/2018 0534   CO2 23 08/10/2018 0639   CO2 22 08/07/2018 0702   CO2 20 (L) 08/05/2018 0534   GLUCOSE 139 (H) 08/10/2018 0639   GLUCOSE 128 (H) 08/07/2018 0702   GLUCOSE 144 (H) 08/05/2018 0534   BUN 33 (H) 08/10/2018 0639   BUN 28 (H) 08/07/2018 0702   BUN 26 (H) 08/05/2018 0534   CREATININE 0.78 08/10/2018 0639   CREATININE 0.86 08/07/2018 0702   CREATININE 0.92 08/05/2018 0534   CALCIUM 10.3 08/10/2018 0639   CALCIUM 9.7 08/07/2018 0702   CALCIUM 9.8 08/05/2018 0534   GFRNONAA >60 08/10/2018 0639   GFRNONAA >60 08/07/2018 0702   GFRNONAA >60 08/05/2018 0534   GFRAA >60 08/10/2018 0639   GFRAA >60 08/07/2018 0702   GFRAA >60 08/05/2018 0534   CBC    Component Value Date/Time   WBC 12.3 (H) 08/10/2018 0639   RBC 6.15 (H) 08/10/2018 0639   HGB 17.0 08/10/2018 0639   HCT 54.4 (H) 08/10/2018 0639   HCT 43.3 05/13/2015 1945   PLT 253 08/10/2018 0639   MCV 88.5 08/10/2018 0639   MCH 27.6 08/10/2018 0639   MCHC 31.3 08/10/2018 0639   RDW 15.8 (H) 08/10/2018 0639   LYMPHSABS 1.9 07/02/2018 2251   MONOABS 1.3 (H) 07/02/2018 2251   EOSABS 0.0 07/02/2018 2251   BASOSABS 0.0 07/02/2018 2251   HEPATIC Function Panel Recent Labs    07/21/18 0705 08/04/18 0703  PROT 7.4 6.9   HEMOGLOBIN A1C No components found for: HGA1C,  MPG CARDIAC ENZYMES Lab Results  Component Value Date   CKTOTAL 63 07/06/2018   CKMB 2.1 07/06/2018   TROPONINI 0.04 (HH) 07/06/2018   TROPONINI 0.04 (HH) 07/06/2018   TROPONINI 0.04 (HH) 07/06/2018   BNP No results for input(s): PROBNP in the last 8760 hours. TSH Recent Labs    07/13/18 1822  TSH 1.527   CHOLESTEROL No results for input(s): CHOL in the last 8760 hours.  Scheduled Meds: Continuous Infusions: PRN Meds:.HYDROmorphone (DILAUDID) injection, iohexol, ondansetron (ZOFRAN) IV  Assessment/Plan: Atrial flutter/fib with RVR Chronic left systolic heart failure Chronic RV systolic failure COPD Chronic  respiratory failure Urosepsis MV and AV endocarditis S/P tracheostomy Possible dehydration  IV saline bolus-250 cc/hr x one and repeat order x one if HR improving. PCXR. Increase metoprolol and lanoxin dose. Blood work. May need repeat echocardiogram.   LOS: 0 days    Orpah Cobb  MD  08/11/2018, 2:56 PM

## 2018-08-12 DIAGNOSIS — I482 Chronic atrial fibrillation, unspecified: Secondary | ICD-10-CM | POA: Diagnosis not present

## 2018-08-12 DIAGNOSIS — J9621 Acute and chronic respiratory failure with hypoxia: Secondary | ICD-10-CM | POA: Diagnosis not present

## 2018-08-12 DIAGNOSIS — J449 Chronic obstructive pulmonary disease, unspecified: Secondary | ICD-10-CM | POA: Diagnosis not present

## 2018-08-12 DIAGNOSIS — I5021 Acute systolic (congestive) heart failure: Secondary | ICD-10-CM | POA: Diagnosis not present

## 2018-08-12 LAB — BASIC METABOLIC PANEL
Anion gap: 8 (ref 5–15)
BUN: 38 mg/dL — ABNORMAL HIGH (ref 8–23)
CO2: 18 mmol/L — ABNORMAL LOW (ref 22–32)
Calcium: 9.3 mg/dL (ref 8.9–10.3)
Chloride: 121 mmol/L — ABNORMAL HIGH (ref 98–111)
Creatinine, Ser: 0.85 mg/dL (ref 0.61–1.24)
GFR calc Af Amer: >60 mL/min (ref >60)
GFR calc non Af Amer: >60 mL/min (ref >60)
Glucose, Bld: 174 mg/dL — ABNORMAL HIGH (ref 70–99)
Potassium: 3.6 mmol/L (ref 3.5–5.1)
Sodium: 147 mmol/L — ABNORMAL HIGH (ref 135–145)

## 2018-08-12 LAB — CBC
HCT: 56.2 % — ABNORMAL HIGH (ref 39.0–52.0)
Hemoglobin: 17.5 g/dL — ABNORMAL HIGH (ref 13.0–17.0)
MCH: 27.8 pg (ref 26.0–34.0)
MCHC: 31.1 g/dL (ref 30.0–36.0)
MCV: 89.2 fL (ref 80.0–100.0)
Platelets: 156 10*3/uL (ref 150–400)
RBC: 6.3 MIL/uL — ABNORMAL HIGH (ref 4.22–5.81)
RDW: 16.4 % — ABNORMAL HIGH (ref 11.5–15.5)
WBC: 11.7 10*3/uL — ABNORMAL HIGH (ref 4.0–10.5)
nRBC: 0 % (ref 0.0–0.2)

## 2018-08-12 LAB — URINE CULTURE: Culture: NO GROWTH

## 2018-08-12 LAB — MAGNESIUM: Magnesium: 2.1 mg/dL (ref 1.7–2.4)

## 2018-08-12 LAB — TRIGLYCERIDES: Triglycerides: 195 mg/dL — ABNORMAL HIGH (ref <150)

## 2018-08-12 NOTE — Progress Notes (Addendum)
Pulmonary Critical Care Medicine [facility] [place]   PULMONARY CRITICAL CARE SERVICE  PROGRESS NOTE  Date of Service: 08/12/2018  [name] [name]  [id number]:[id number]  DOB: 12-29-1955   DOA: 07/02/2018  Referring Physician: Carron Curie, MD  HPI: [name] [name] is a 63 y.o. male seen for follow up of Acute on Chronic Respiratory Failure.  Patient Had to be removed today due to tachycardia in the 160s.  Patient was placed on Cardizem drip and is now on aerosol trach collar 28% FiO2.  Medications: Reviewed on Rounds  Physical Exam:  Vitals: Pulse 99 respirations 38 BP 100/65 O2 sat 95% temp 100.1  Ventilator Settings not currently on ventilator  . General: Comfortable at this time . Eyes: Grossly normal lids, irises & conjunctiva . ENT: grossly tongue is normal . Neck: no obvious mass . Cardiovascular: S1 S2 normal no gallop . Respiratory: No rales or rhonchi noted . Abdomen: soft . Skin: no rash seen on limited exam . Musculoskeletal: not rigid . Psychiatric:unable to assess . Neurologic: no seizure no involuntary movements         Lab Data:   Basic Metabolic Panel: Recent Labs  Lab 08/07/18 0702 08/10/18 0639 08/11/18 1846 08/12/18 0022  NA 142 145 149* 147*  K 3.8 3.9 3.9 3.6  CL 113* 114* 119* 121*  CO2 22 23 22  18*  GLUCOSE 128* 139* 190* 174*  BUN 28* 33* 38* 38*  CREATININE 0.86 0.78 1.04 0.85  CALCIUM 9.7 10.3 10.2 9.3  MG 2.2 2.4  --  2.1  PHOS 4.4 4.1  --   --     ABG: No results for input(s): PHART, PCO2ART, PO2ART, HCO3, O2SAT in the last 168 hours.  Liver Function Tests: Recent Labs  Lab 08/11/18 1846  AST 114*  ALT 133*  ALKPHOS 141*  BILITOT 0.6  PROT 8.2*  ALBUMIN 3.5   No results for input(s): LIPASE, AMYLASE in the last 168 hours. No results for input(s): AMMONIA in the last 168 hours.  CBC: Recent Labs  Lab 08/07/18 0702 08/10/18 0639 08/11/18 1846 08/12/18 0022  WBC 9.9 12.3* 12.5* 11.7*  HGB 16.3 17.0 19.1*  17.5*  HCT 50.0 54.4* 60.9* 56.2*  MCV 88.2 88.5 89.0 89.2  PLT 263 253 261 156    Cardiac Enzymes: No results for input(s): CKTOTAL, CKMB, CKMBINDEX, TROPONINI in the last 168 hours.  BNP (last 3 results) No results for input(s): BNP in the last 8760 hours.  ProBNP (last 3 results) No results for input(s): PROBNP in the last 8760 hours.  Radiological Exams: Dg Chest Port 1 View  Result Date: 08/11/2018 CLINICAL DATA:  Shortness of breath EXAM: PORTABLE CHEST 1 VIEW COMPARISON:  August 02, 2018 FINDINGS: Tracheostomy catheter tip is 6.4 cm above the carina. No pneumothorax. No edema consolidation. Heart size and pulmonary vascularity are normal. No adenopathy. There is aortic atherosclerosis. No bone lesions. IMPRESSION: Tracheostomy as described without pneumothorax. No edema or consolidation. Heart size normal. Aortic Atherosclerosis (ICD10-I70.0). Electronically Signed   By: Bretta Bang III M.D.   On: 08/11/2018 16:10    Assessment/Plan Active Problems:   Acute on chronic respiratory failure with hypoxia (HCC)   COPD, severe (HCC)   Chronic atrial fibrillation   Acute systolic heart failure (HCC)   Lobar pneumonia, unspecified organism (HCC)   1. Acute on chronic respiratory failure with hypoxia stay on ATC today and reassess for capping tomorrow. 2. Severe COPD at baseline 3. Chronic atrial fibrillation rate controlled 4. Acute systolic  heart failure grossly unchanged 5. Pneumonia treated continue supportive measures   I have personally seen and evaluated the patient, evaluated laboratory and imaging results, formulated the assessment and plan and placed orders. The Patient requires high complexity decision making for assessment and support.  Case was discussed on Rounds with the Respiratory Therapy Staff  Yevonne Pax, MD [facility] Pulmonary Critical Care Medicine Sleep Medicine

## 2018-08-13 DIAGNOSIS — I5021 Acute systolic (congestive) heart failure: Secondary | ICD-10-CM | POA: Diagnosis not present

## 2018-08-13 DIAGNOSIS — J9621 Acute and chronic respiratory failure with hypoxia: Secondary | ICD-10-CM | POA: Diagnosis not present

## 2018-08-13 DIAGNOSIS — I482 Chronic atrial fibrillation, unspecified: Secondary | ICD-10-CM | POA: Diagnosis not present

## 2018-08-13 DIAGNOSIS — J449 Chronic obstructive pulmonary disease, unspecified: Secondary | ICD-10-CM | POA: Diagnosis not present

## 2018-08-13 LAB — BASIC METABOLIC PANEL
Anion gap: 8 (ref 5–15)
BUN: 37 mg/dL — ABNORMAL HIGH (ref 8–23)
CO2: 21 mmol/L — ABNORMAL LOW (ref 22–32)
Calcium: 8.9 mg/dL (ref 8.9–10.3)
Chloride: 118 mmol/L — ABNORMAL HIGH (ref 98–111)
Creatinine, Ser: 0.93 mg/dL (ref 0.61–1.24)
GFR calc Af Amer: >60 mL/min (ref >60)
GFR calc non Af Amer: >60 mL/min (ref >60)
Glucose, Bld: 190 mg/dL — ABNORMAL HIGH (ref 70–99)
Potassium: 3.1 mmol/L — ABNORMAL LOW (ref 3.5–5.1)
Sodium: 147 mmol/L — ABNORMAL HIGH (ref 135–145)

## 2018-08-13 LAB — CBC
HCT: 55 % — ABNORMAL HIGH (ref 39.0–52.0)
Hemoglobin: 17.2 g/dL — ABNORMAL HIGH (ref 13.0–17.0)
MCH: 27.7 pg (ref 26.0–34.0)
MCHC: 31.3 g/dL (ref 30.0–36.0)
MCV: 88.7 fL (ref 80.0–100.0)
Platelets: 149 10*3/uL — ABNORMAL LOW (ref 150–400)
RBC: 6.2 MIL/uL — ABNORMAL HIGH (ref 4.22–5.81)
RDW: 16 % — ABNORMAL HIGH (ref 11.5–15.5)
WBC: 18.3 10*3/uL — ABNORMAL HIGH (ref 4.0–10.5)
nRBC: 0 % (ref 0.0–0.2)

## 2018-08-13 LAB — VANCOMYCIN, TROUGH: Vancomycin Tr: 12 ug/mL — ABNORMAL LOW (ref 15–20)

## 2018-08-13 LAB — LACTIC ACID, PLASMA: Lactic Acid, Venous: 1.9 mmol/L (ref 0.5–1.9)

## 2018-08-13 LAB — PROTIME-INR
INR: 2.3 — ABNORMAL HIGH (ref 0.8–1.2)
Prothrombin Time: 25 seconds — ABNORMAL HIGH (ref 11.4–15.2)

## 2018-08-13 NOTE — Progress Notes (Addendum)
Pulmonary Critical Care Medicine [facility] [place]   PULMONARY CRITICAL CARE SERVICE  PROGRESS NOTE  Date of Service: 08/13/2018  [name] [name]  [id number]  DOB: [birth year]   DOA: 07/02/2018  Referring Physician: Carron Curie, MD  HPI: [name] [name] is a 63 y.o. male seen for follow up of Acute on Chronic Respiratory Failure.  Patient is currently on aerosol trach collar 28% with no distress noted.  Doing her today.  Medications: Reviewed on Rounds  Physical Exam:  Vitals: Pulse 96 respirations 34 BP 148/65 O2 sat 93% temp 98 0  Ventilator Settings not currently  . General: Comfortable at this time . Eyes: Grossly normal lids, irises & conjunctiva . ENT: grossly tongue is normal . Neck: no obvious mass . Cardiovascular: S1 S2 normal no gallop . Respiratory: No rales or rhonchi noted . Abdomen: soft . Skin: no rash seen on limited exam . Musculoskeletal: not rigid . Psychiatric:unable to assess . Neurologic: no seizure no involuntary movements         Lab Data:   Basic Metabolic Panel: Recent Labs  Lab 08/07/18 0702 08/10/18 0639 08/11/18 1846 08/12/18 0022 08/13/18 0548  NA 142 145 149* 147* 147*  K 3.8 3.9 3.9 3.6 3.1*  CL 113* 114* 119* 121* 118*  CO2 22 23 22  18* 21*  GLUCOSE 128* 139* 190* 174* 190*  BUN 28* 33* 38* 38* 37*  CREATININE 0.86 0.78 1.04 0.85 0.93  CALCIUM 9.7 10.3 10.2 9.3 8.9  MG 2.2 2.4  --  2.1  --   PHOS 4.4 4.1  --   --   --     ABG: No results for input(s): PHART, PCO2ART, PO2ART, HCO3, O2SAT in the last 168 hours.  Liver Function Tests: Recent Labs  Lab 08/11/18 1846  AST 114*  ALT 133*  ALKPHOS 141*  BILITOT 0.6  PROT 8.2*  ALBUMIN 3.5   No results for input(s): LIPASE, AMYLASE in the last 168 hours. No results for input(s): AMMONIA in the last 168 hours.  CBC: Recent Labs  Lab 08/07/18 0702 08/10/18 0639 08/11/18 1846 08/12/18 0022 08/13/18 0548  WBC 9.9 12.3* 12.5* 11.7* 18.3*  HGB 16.3  17.0 19.1* 17.5* 17.2*  HCT 50.0 54.4* 60.9* 56.2* 55.0*  MCV 88.2 88.5 89.0 89.2 88.7  PLT 263 253 261 156 149*    Cardiac Enzymes: No results for input(s): CKTOTAL, CKMB, CKMBINDEX, TROPONINI in the last 168 hours.  BNP (last 3 results) No results for input(s): BNP in the last 8760 hours.  ProBNP (last 3 results) No results for input(s): PROBNP in the last 8760 hours.  Radiological Exams: Dg Chest Port 1 View  Result Date: 08/11/2018 CLINICAL DATA:  Shortness of breath EXAM: PORTABLE CHEST 1 VIEW COMPARISON:  August 02, 2018 FINDINGS: Tracheostomy catheter tip is 6.4 cm above the carina. No pneumothorax. No edema consolidation. Heart size and pulmonary vascularity are normal. No adenopathy. There is aortic atherosclerosis. No bone lesions. IMPRESSION: Tracheostomy as described without pneumothorax. No edema or consolidation. Heart size normal. Aortic Atherosclerosis (ICD10-I70.0). Electronically Signed   By: Bretta Bang III M.D.   On: 08/11/2018 16:10    Assessment/Plan Active Problems:   Acute on chronic respiratory failure with hypoxia (HCC)   COPD, severe (HCC)   Chronic atrial fibrillation   Acute systolic heart failure (HCC)   Lobar pneumonia, unspecified organism (HCC)   1. Acute on chronic respiratory fail hypoxia continues to do well with aerosol trach collar 20% FiO2.  Reassess for capping  at this time.  Continue pulmonary toilet and secretion management 2. Severe COPD at baseline 3. Chronic atrial fibrillation rate controlled 4. Acute systolic heart failure grossly unchanged 5. Pneumonia treated continue supportive measures   I have personally seen and evaluated the patient, evaluated laboratory and imaging results, formulated the assessment and plan and placed orders. The Patient requires high complexity decision making for assessment and support.  Case was discussed on Rounds with the Respiratory Therapy Staff  Yevonne Pax, MD [facility] Pulmonary Critical Care  Medicine Sleep Medicine

## 2018-08-14 DIAGNOSIS — I482 Chronic atrial fibrillation, unspecified: Secondary | ICD-10-CM | POA: Diagnosis not present

## 2018-08-14 DIAGNOSIS — J449 Chronic obstructive pulmonary disease, unspecified: Secondary | ICD-10-CM | POA: Diagnosis not present

## 2018-08-14 DIAGNOSIS — J9621 Acute and chronic respiratory failure with hypoxia: Secondary | ICD-10-CM | POA: Diagnosis not present

## 2018-08-14 DIAGNOSIS — I5021 Acute systolic (congestive) heart failure: Secondary | ICD-10-CM | POA: Diagnosis not present

## 2018-08-14 LAB — BASIC METABOLIC PANEL
Anion gap: 8 (ref 5–15)
BUN: 28 mg/dL — ABNORMAL HIGH (ref 8–23)
CO2: 19 mmol/L — ABNORMAL LOW (ref 22–32)
Calcium: 8.2 mg/dL — ABNORMAL LOW (ref 8.9–10.3)
Chloride: 118 mmol/L — ABNORMAL HIGH (ref 98–111)
Creatinine, Ser: 0.68 mg/dL (ref 0.61–1.24)
GFR calc Af Amer: >60 mL/min (ref >60)
GFR calc non Af Amer: >60 mL/min (ref >60)
Glucose, Bld: 144 mg/dL — ABNORMAL HIGH (ref 70–99)
Potassium: 3.7 mmol/L (ref 3.5–5.1)
Sodium: 145 mmol/L (ref 135–145)

## 2018-08-14 LAB — CBC
HCT: 47.6 % (ref 39.0–52.0)
Hemoglobin: 14.3 g/dL (ref 13.0–17.0)
MCH: 27.4 pg (ref 26.0–34.0)
MCHC: 30 g/dL (ref 30.0–36.0)
MCV: 91.4 fL (ref 80.0–100.0)
Platelets: 91 10*3/uL — ABNORMAL LOW (ref 150–400)
RBC: 5.21 MIL/uL (ref 4.22–5.81)
RDW: 15.7 % — ABNORMAL HIGH (ref 11.5–15.5)
WBC: 10.8 10*3/uL — ABNORMAL HIGH (ref 4.0–10.5)
nRBC: 0 % (ref 0.0–0.2)

## 2018-08-14 LAB — CULTURE, RESPIRATORY W GRAM STAIN: Culture: NORMAL

## 2018-08-14 LAB — VANCOMYCIN, TROUGH: Vancomycin Tr: 19 ug/mL (ref 15–20)

## 2018-08-14 LAB — PROTIME-INR
INR: 2.7 — ABNORMAL HIGH (ref 0.8–1.2)
Prothrombin Time: 28 seconds — ABNORMAL HIGH (ref 11.4–15.2)

## 2018-08-14 LAB — MAGNESIUM: Magnesium: 1.8 mg/dL (ref 1.7–2.4)

## 2018-08-14 NOTE — Progress Notes (Addendum)
Pulmonary Critical Care Medicine [facility] [place]   PULMONARY CRITICAL CARE SERVICE  PROGRESS NOTE  Date of Service: 08/14/2018  [place]  [id number]  DOB: 01/26/1956   DOA: 07/02/2018  Referring Physician: Carron Curie, MD  HPI: [name] [name] is a 63 y.o. male seen for follow up of Acute on Chronic Respiratory Failure.  Patient remains on aerosol trach collar 28% FiO2.  No acute distress is noted today.  Medications: Reviewed on Rounds  Physical Exam:  Vitals: Pulse 83 respirations 26 BP 107/68 O2 sat 96% temp 96.0  Ventilator Settings not currently on ventilator  . General: Comfortable at this time . Eyes: Grossly normal lids, irises & conjunctiva . ENT: grossly tongue is normal . Neck: no obvious mass . Cardiovascular: S1 S2 normal no gallop . Respiratory: No rales or rhonchi noted . Abdomen: soft . Skin: no rash seen on limited exam . Musculoskeletal: not rigid . Psychiatric:unable to assess . Neurologic: no seizure no involuntary movements         Lab Data:   Basic Metabolic Panel: Recent Labs  Lab 08/10/18 0639 08/11/18 1846 08/12/18 0022 08/13/18 0548 08/14/18 0938  NA 145 149* 147* 147* 145  K 3.9 3.9 3.6 3.1* 3.7  CL 114* 119* 121* 118* 118*  CO2 23 22 18* 21* 19*  GLUCOSE 139* 190* 174* 190* 144*  BUN 33* 38* 38* 37* 28*  CREATININE 0.78 1.04 0.85 0.93 0.68  CALCIUM 10.3 10.2 9.3 8.9 8.2*  MG 2.4  --  2.1  --  1.8  PHOS 4.1  --   --   --   --     ABG: No results for input(s): PHART, PCO2ART, PO2ART, HCO3, O2SAT in the last 168 hours.  Liver Function Tests: Recent Labs  Lab 08/11/18 1846  AST 114*  ALT 133*  ALKPHOS 141*  BILITOT 0.6  PROT 8.2*  ALBUMIN 3.5   No results for input(s): LIPASE, AMYLASE in the last 168 hours. No results for input(s): AMMONIA in the last 168 hours.  CBC: Recent Labs  Lab 08/10/18 0639 08/11/18 1846 08/12/18 0022 08/13/18 0548 08/14/18 0938  WBC 12.3* 12.5* 11.7* 18.3* 10.8*   HGB 17.0 19.1* 17.5* 17.2* 14.3  HCT 54.4* 60.9* 56.2* 55.0* 47.6  MCV 88.5 89.0 89.2 88.7 91.4  PLT 253 261 156 149* 91*    Cardiac Enzymes: No results for input(s): CKTOTAL, CKMB, CKMBINDEX, TROPONINI in the last 168 hours.  BNP (last 3 results) No results for input(s): BNP in the last 8760 hours.  ProBNP (last 3 results) No results for input(s): PROBNP in the last 8760 hours.  Radiological Exams: No results found.  Assessment/Plan Active Problems:   Acute on chronic respiratory failure with hypoxia (HCC)   COPD, severe (HCC)   Chronic atrial fibrillation   Acute systolic heart failure (HCC)   Lobar pneumonia, unspecified organism (HCC)   1. Acute on chronic respiratory failure with hypoxia continue with T collar 28% FiO2.  Reassess ability for capping.  Continue pulmonary toilet and secretion management 2. Severe COPD at baseline 3. Chronic atrial fibrillation rate controlled 4. Acute systolic heart failure grossly unchanged 5. Pneumonia treated continue supportive measures   I have personally seen and evaluated the patient, evaluated laboratory and imaging results, formulated the assessment and plan and placed orders. The Patient requires high complexity decision making for assessment and support.  Case was discussed on Rounds with the Respiratory Therapy Staff  Yevonne Pax, MD [facility] Pulmonary Critical Care Medicine  Sleep Medicine

## 2018-08-15 DIAGNOSIS — J449 Chronic obstructive pulmonary disease, unspecified: Secondary | ICD-10-CM | POA: Diagnosis not present

## 2018-08-15 DIAGNOSIS — I5021 Acute systolic (congestive) heart failure: Secondary | ICD-10-CM | POA: Diagnosis not present

## 2018-08-15 DIAGNOSIS — J9621 Acute and chronic respiratory failure with hypoxia: Secondary | ICD-10-CM | POA: Diagnosis not present

## 2018-08-15 DIAGNOSIS — I482 Chronic atrial fibrillation, unspecified: Secondary | ICD-10-CM | POA: Diagnosis not present

## 2018-08-15 LAB — PROTIME-INR
INR: 2.6 — ABNORMAL HIGH (ref 0.8–1.2)
Prothrombin Time: 27.3 seconds — ABNORMAL HIGH (ref 11.4–15.2)

## 2018-08-15 LAB — CBC
HCT: 44 % (ref 39.0–52.0)
Hemoglobin: 13.4 g/dL (ref 13.0–17.0)
MCH: 27.5 pg (ref 26.0–34.0)
MCHC: 30.5 g/dL (ref 30.0–36.0)
MCV: 90.2 fL (ref 80.0–100.0)
Platelets: 87 10*3/uL — ABNORMAL LOW (ref 150–400)
RBC: 4.88 MIL/uL (ref 4.22–5.81)
RDW: 15.6 % — ABNORMAL HIGH (ref 11.5–15.5)
WBC: 8.4 10*3/uL (ref 4.0–10.5)
nRBC: 0 % (ref 0.0–0.2)

## 2018-08-15 LAB — TRIGLYCERIDES: Triglycerides: 154 mg/dL — ABNORMAL HIGH (ref <150)

## 2018-08-15 NOTE — Progress Notes (Addendum)
Pulmonary Critical Care Medicine [facility] [place]   PULMONARY CRITICAL CARE SERVICE  PROGRESS NOTE  Date of Service: 08/15/2018  [name] [name]  [id number]  DOB: 11-21-1955   DOA: 07/02/2018  Referring Physician: Carron Curie, MD  HPI: [name] [name] is a 63 y.o. male seen for follow up of Acute on Chronic Respiratory Failure.  Patient is only was on trach 28% FiO2 today using PMV without difficulty.  No distress noted.  Medications: Reviewed on Rounds  Physical Exam:  Vitals: Pulse 60 respirations 20 BP 106/72 O2 sat 90% temp 98.0  Ventilator Settings not currently on ventilator  . General: Comfortable at this time . Eyes: Grossly normal lids, irises & conjunctiva . ENT: grossly tongue is normal . Neck: no obvious mass . Cardiovascular: S1 S2 normal no gallop . Respiratory: No rales or rhonchi noted . Abdomen: soft . Skin: no rash seen on limited exam . Musculoskeletal: not rigid . Psychiatric:unable to assess . Neurologic: no seizure no involuntary movements         Lab Data:   Basic Metabolic Panel: Recent Labs  Lab 08/10/18 0639 08/11/18 1846 08/12/18 0022 08/13/18 0548 08/14/18 0938  NA 145 149* 147* 147* 145  K 3.9 3.9 3.6 3.1* 3.7  CL 114* 119* 121* 118* 118*  CO2 23 22 18* 21* 19*  GLUCOSE 139* 190* 174* 190* 144*  BUN 33* 38* 38* 37* 28*  CREATININE 0.78 1.04 0.85 0.93 0.68  CALCIUM 10.3 10.2 9.3 8.9 8.2*  MG 2.4  --  2.1  --  1.8  PHOS 4.1  --   --   --   --     ABG: No results for input(s): PHART, PCO2ART, PO2ART, HCO3, O2SAT in the last 168 hours.  Liver Function Tests: Recent Labs  Lab 08/11/18 1846  AST 114*  ALT 133*  ALKPHOS 141*  BILITOT 0.6  PROT 8.2*  ALBUMIN 3.5   No results for input(s): LIPASE, AMYLASE in the last 168 hours. No results for input(s): AMMONIA in the last 168 hours.  CBC: Recent Labs  Lab 08/11/18 1846 08/12/18 0022 08/13/18 0548 08/14/18 0938 08/15/18 0650  WBC 12.5* 11.7* 18.3*  10.8* 8.4  HGB 19.1* 17.5* 17.2* 14.3 13.4  HCT 60.9* 56.2* 55.0* 47.6 44.0  MCV 89.0 89.2 88.7 91.4 90.2  PLT 261 156 149* 91* 87*    Cardiac Enzymes: No results for input(s): CKTOTAL, CKMB, CKMBINDEX, TROPONINI in the last 168 hours.  BNP (last 3 results) No results for input(s): BNP in the last 8760 hours.  ProBNP (last 3 results) No results for input(s): PROBNP in the last 8760 hours.  Radiological Exams: No results found.  Assessment/Plan Active Problems:   Acute on chronic respiratory failure with hypoxia (HCC)   COPD, severe (HCC)   Chronic atrial fibrillation   Acute systolic heart failure (HCC)   Lobar pneumonia, unspecified organism (HCC)   1. Acute on chronic respiratory with hypoxia continue with T collar 28% FiO2.  May attempt capping trial again at this time.  Continue pulmonary toilet and secretion management. 2. Severe COPD at baseline 3. Chronic atrial fibrillation rate controlled 4. Acute on heart failure grossly unchanged 5. Pneumonia treated continue supportive measures   I have personally seen and evaluated the patient, evaluated laboratory and imaging results, formulated the assessment and plan and placed orders. The Patient requires high complexity decision making for assessment and support.  Case was discussed on Rounds with the Respiratory Therapy Staff  Yevonne Pax, MD  [facility] Pulmonary Critical Care Medicine Sleep Medicine

## 2018-08-16 DIAGNOSIS — I5021 Acute systolic (congestive) heart failure: Secondary | ICD-10-CM | POA: Diagnosis not present

## 2018-08-16 DIAGNOSIS — J9621 Acute and chronic respiratory failure with hypoxia: Secondary | ICD-10-CM | POA: Diagnosis not present

## 2018-08-16 DIAGNOSIS — I482 Chronic atrial fibrillation, unspecified: Secondary | ICD-10-CM | POA: Diagnosis not present

## 2018-08-16 DIAGNOSIS — J449 Chronic obstructive pulmonary disease, unspecified: Secondary | ICD-10-CM | POA: Diagnosis not present

## 2018-08-16 LAB — BASIC METABOLIC PANEL
Anion gap: 9 (ref 5–15)
BUN: 22 mg/dL (ref 8–23)
CO2: 15 mmol/L — ABNORMAL LOW (ref 22–32)
Calcium: 8.7 mg/dL — ABNORMAL LOW (ref 8.9–10.3)
Chloride: 116 mmol/L — ABNORMAL HIGH (ref 98–111)
Creatinine, Ser: 0.6 mg/dL — ABNORMAL LOW (ref 0.61–1.24)
GFR calc Af Amer: >60 mL/min (ref >60)
GFR calc non Af Amer: >60 mL/min (ref >60)
Glucose, Bld: 163 mg/dL — ABNORMAL HIGH (ref 70–99)
Potassium: 4.8 mmol/L (ref 3.5–5.1)
Sodium: 140 mmol/L (ref 135–145)

## 2018-08-16 LAB — PROTIME-INR
INR: 1.6 — ABNORMAL HIGH (ref 0.8–1.2)
Prothrombin Time: 18.8 seconds — ABNORMAL HIGH (ref 11.4–15.2)

## 2018-08-16 LAB — CBC
HCT: 45.2 % (ref 39.0–52.0)
Hemoglobin: 14.5 g/dL (ref 13.0–17.0)
MCH: 28.9 pg (ref 26.0–34.0)
MCHC: 32.1 g/dL (ref 30.0–36.0)
MCV: 90 fL (ref 80.0–100.0)
Platelets: 92 K/uL — ABNORMAL LOW (ref 150–400)
RBC: 5.02 MIL/uL (ref 4.22–5.81)
RDW: 15.6 % — ABNORMAL HIGH (ref 11.5–15.5)
WBC: 6.3 K/uL (ref 4.0–10.5)
nRBC: 0 % (ref 0.0–0.2)

## 2018-08-16 LAB — CULTURE, BLOOD (ROUTINE X 2)
Culture: NO GROWTH
Culture: NO GROWTH
Special Requests: ADEQUATE
Special Requests: ADEQUATE

## 2018-08-16 LAB — MAGNESIUM: Magnesium: 2.1 mg/dL (ref 1.7–2.4)

## 2018-08-16 LAB — HEPARIN INDUCED PLATELET AB (HIT ANTIBODY): Heparin Induced Plt Ab: 0.085 {OD_unit} (ref 0.000–0.400)

## 2018-08-16 NOTE — Progress Notes (Addendum)
Pulmonary Critical Care Medicine [facility] [place]   PULMONARY CRITICAL CARE SERVICE  PROGRESS NOTE  Date of Service: 08/16/2018  [name] [name]  VXB:[id number]  DOB: [birth year]   DOA: 07/02/2018  Referring Physician: Carron Curie, MD  HPI: [name] [name] is a 63 y.o. male seen for follow up of Acute on Chronic Respiratory Failure.  Patient continues on 28% aerosol trach collar using PMV without difficulty.  Medications: Reviewed on Rounds  Physical Exam:  Vitals: Pulse 64 respirations 20 BP 114/68 O2 sat 1% temp 97.0  Ventilator Settings on trach collar 28%  . General: Comfortable at this time . Eyes: Grossly normal lids, irises & conjunctiva . ENT: grossly tongue is normal . Neck: no obvious mass . Cardiovascular: S1 S2 normal no gallop . Respiratory: No rales or rhonchi noted . Abdomen: soft . Skin: no rash seen on limited exam . Musculoskeletal: not rigid . Psychiatric:unable to assess . Neurologic: no seizure no involuntary movements         Lab Data:   Basic Metabolic Panel: Recent Labs  Lab 08/10/18 0639 08/11/18 1846 08/12/18 0022 08/13/18 0548 08/14/18 0938 08/16/18 0409  NA 145 149* 147* 147* 145 140  K 3.9 3.9 3.6 3.1* 3.7 4.8  CL 114* 119* 121* 118* 118* 116*  CO2 23 22 18* 21* 19* 15*  GLUCOSE 139* 190* 174* 190* 144* 163*  BUN 33* 38* 38* 37* 28* 22  CREATININE 0.78 1.04 0.85 0.93 0.68 0.60*  CALCIUM 10.3 10.2 9.3 8.9 8.2* 8.7*  MG 2.4  --  2.1  --  1.8 2.1  PHOS 4.1  --   --   --   --   --     ABG: No results for input(s): PHART, PCO2ART, PO2ART, HCO3, O2SAT in the last 168 hours.  Liver Function Tests: Recent Labs  Lab 08/11/18 1846  AST 114*  ALT 133*  ALKPHOS 141*  BILITOT 0.6  PROT 8.2*  ALBUMIN 3.5   No results for input(s): LIPASE, AMYLASE in the last 168 hours. No results for input(s): AMMONIA in the last 168 hours.  CBC: Recent Labs  Lab 08/12/18 0022 08/13/18 0548 08/14/18 0938 08/15/18 0650  08/16/18 0409  WBC 11.7* 18.3* 10.8* 8.4 6.3  HGB 17.5* 17.2* 14.3 13.4 14.5  HCT 56.2* 55.0* 47.6 44.0 45.2  MCV 89.2 88.7 91.4 90.2 90.0  PLT 156 149* 91* 87* 92*    Cardiac Enzymes: No results for input(s): CKTOTAL, CKMB, CKMBINDEX, TROPONINI in the last 168 hours.  BNP (last 3 results) No results for input(s): BNP in the last 8760 hours.  ProBNP (last 3 results) No results for input(s): PROBNP in the last 8760 hours.  Radiological Exams: No results found.  Assessment/Plan Active Problems:   Acute on chronic respiratory failure with hypoxia (HCC)   COPD, severe (HCC)   Chronic atrial fibrillation   Acute systolic heart failure (HCC)   Lobar pneumonia, unspecified organism (HCC)   1. Acute on chronic respiratory failure with hypoxia continue T collar 2018 as before.  Progress to capping as patient tolerated.  Continue pulmonary toilet and secretion management 2. Severe COPD at baseline 3. Chronic atrial fibrillation controlled 4. Acute systolic heart failure grossly unchanged 5. Pneumonia treated continue supportive measures   I have personally seen and evaluated the patient, evaluated laboratory and imaging results, formulated the assessment and plan and placed orders. The Patient requires high complexity decision making for assessment and support.  Case was discussed on Rounds with the Respiratory Therapy  Staff  Allyne Gee, MD [facility] Pulmonary Critical Care Medicine Sleep Medicine

## 2018-08-17 DIAGNOSIS — I482 Chronic atrial fibrillation, unspecified: Secondary | ICD-10-CM | POA: Diagnosis not present

## 2018-08-17 DIAGNOSIS — I5021 Acute systolic (congestive) heart failure: Secondary | ICD-10-CM | POA: Diagnosis not present

## 2018-08-17 DIAGNOSIS — J449 Chronic obstructive pulmonary disease, unspecified: Secondary | ICD-10-CM | POA: Diagnosis not present

## 2018-08-17 DIAGNOSIS — J9621 Acute and chronic respiratory failure with hypoxia: Secondary | ICD-10-CM | POA: Diagnosis not present

## 2018-08-17 LAB — PROTIME-INR
INR: 1.3 — ABNORMAL HIGH (ref 0.8–1.2)
Prothrombin Time: 16.2 seconds — ABNORMAL HIGH (ref 11.4–15.2)

## 2018-08-17 NOTE — Progress Notes (Addendum)
Pulmonary Critical Care Medicine [facility] [place]   PULMONARY CRITICAL CARE SERVICE  PROGRESS NOTE  Date of Service: 08/17/2018  [name] [name]  MOQ:[id number]  DOB: 03-Nov-1955   DOA: 07/02/2018  Referring Physician: Carron Curie, MD  HPI: [name] [name] is a 63 y.o. male seen for follow up of Acute on Chronic Respiratory Failure.  Patient remains on 28% aerosol trach collar.  Patient using PMV without difficulty.  Medications: Reviewed on Rounds  Physical Exam:  Vitals: Pulse 61 respirations 20 BP 135/80 O2 sat 98% temp 97.2  Ventilator Settings ATC 28%  . General: Comfortable at this time . Eyes: Grossly normal lids, irises & conjunctiva . ENT: grossly tongue is normal . Neck: no obvious mass . Cardiovascular: S1 S2 normal no gallop . Respiratory: No rales or rhonchi noted . Abdomen: soft . Skin: no rash seen on limited exam . Musculoskeletal: not rigid . Psychiatric:unable to assess . Neurologic: no seizure no involuntary movements         Lab Data:   Basic Metabolic Panel: Recent Labs  Lab 08/11/18 1846 08/12/18 0022 08/13/18 0548 08/14/18 0938 08/16/18 0409  NA 149* 147* 147* 145 140  K 3.9 3.6 3.1* 3.7 4.8  CL 119* 121* 118* 118* 116*  CO2 22 18* 21* 19* 15*  GLUCOSE 190* 174* 190* 144* 163*  BUN 38* 38* 37* 28* 22  CREATININE 1.04 0.85 0.93 0.68 0.60*  CALCIUM 10.2 9.3 8.9 8.2* 8.7*  MG  --  2.1  --  1.8 2.1    ABG: No results for input(s): PHART, PCO2ART, PO2ART, HCO3, O2SAT in the last 168 hours.  Liver Function Tests: Recent Labs  Lab 08/11/18 1846  AST 114*  ALT 133*  ALKPHOS 141*  BILITOT 0.6  PROT 8.2*  ALBUMIN 3.5   No results for input(s): LIPASE, AMYLASE in the last 168 hours. No results for input(s): AMMONIA in the last 168 hours.  CBC: Recent Labs  Lab 08/12/18 0022 08/13/18 0548 08/14/18 0938 08/15/18 0650 08/16/18 0409  WBC 11.7* 18.3* 10.8* 8.4 6.3  HGB 17.5* 17.2* 14.3 13.4 14.5  HCT 56.2* 55.0*  47.6 44.0 45.2  MCV 89.2 88.7 91.4 90.2 90.0  PLT 156 149* 91* 87* 92*    Cardiac Enzymes: No results for input(s): CKTOTAL, CKMB, CKMBINDEX, TROPONINI in the last 168 hours.  BNP (last 3 results) No results for input(s): BNP in the last 8760 hours.  ProBNP (last 3 results) No results for input(s): PROBNP in the last 8760 hours.  Radiological Exams: No results found.  Assessment/Plan Active Problems:   Acute on chronic respiratory failure with hypoxia (HCC)   COPD, severe (HCC)   Chronic atrial fibrillation   Acute systolic heart failure (HCC)   Lobar pneumonia, unspecified organism (HCC)   1. Acute on chronic respiratory failure with hypoxia continue with T collar 28%.  May progress to capping during the day.  Continue pulmonary toilet and secretion management 2. Severe COPD at baseline 3. Chronic atrial fibrillation rate controlled 4. Acute systolic heart failure grossly unchanged 5. Ammonia treated continue supportive measures   I have personally seen and evaluated the patient, evaluated laboratory and imaging results, formulated the assessment and plan and placed orders. The Patient requires high complexity decision making for assessment and support.  Case was discussed on Rounds with the Respiratory Therapy Staff  Yevonne Pax, MD [facility] Pulmonary Critical Care Medicine Sleep Medicine

## 2018-08-18 DIAGNOSIS — J449 Chronic obstructive pulmonary disease, unspecified: Secondary | ICD-10-CM | POA: Diagnosis not present

## 2018-08-18 DIAGNOSIS — I482 Chronic atrial fibrillation, unspecified: Secondary | ICD-10-CM | POA: Diagnosis not present

## 2018-08-18 DIAGNOSIS — I5021 Acute systolic (congestive) heart failure: Secondary | ICD-10-CM | POA: Diagnosis not present

## 2018-08-18 DIAGNOSIS — J9621 Acute and chronic respiratory failure with hypoxia: Secondary | ICD-10-CM | POA: Diagnosis not present

## 2018-08-18 LAB — BASIC METABOLIC PANEL
Anion gap: 10 (ref 5–15)
BUN: 21 mg/dL (ref 8–23)
CO2: 18 mmol/L — ABNORMAL LOW (ref 22–32)
Calcium: 8.9 mg/dL (ref 8.9–10.3)
Chloride: 113 mmol/L — ABNORMAL HIGH (ref 98–111)
Creatinine, Ser: 0.56 mg/dL — ABNORMAL LOW (ref 0.61–1.24)
GFR calc Af Amer: >60 mL/min (ref >60)
GFR calc non Af Amer: >60 mL/min (ref >60)
Glucose, Bld: 96 mg/dL (ref 70–99)
Potassium: 3.6 mmol/L (ref 3.5–5.1)
Sodium: 141 mmol/L (ref 135–145)

## 2018-08-18 LAB — CBC
HCT: 41.6 % (ref 39.0–52.0)
Hemoglobin: 13.5 g/dL (ref 13.0–17.0)
MCH: 28 pg (ref 26.0–34.0)
MCHC: 32.5 g/dL (ref 30.0–36.0)
MCV: 86.3 fL (ref 80.0–100.0)
Platelets: 143 10*3/uL — ABNORMAL LOW (ref 150–400)
RBC: 4.82 MIL/uL (ref 4.22–5.81)
RDW: 15.4 % (ref 11.5–15.5)
WBC: 11.8 10*3/uL — ABNORMAL HIGH (ref 4.0–10.5)
nRBC: 0 % (ref 0.0–0.2)

## 2018-08-18 LAB — MAGNESIUM: Magnesium: 2.1 mg/dL (ref 1.7–2.4)

## 2018-08-18 LAB — PROTIME-INR
INR: 1.1 (ref 0.8–1.2)
Prothrombin Time: 14.2 seconds (ref 11.4–15.2)

## 2018-08-18 LAB — TRIGLYCERIDES: Triglycerides: 133 mg/dL (ref <150)

## 2018-08-18 NOTE — Progress Notes (Signed)
   Primary Cardiologist:  No primary care provider on file.   Echocardiogram not indicated at this time and can be performed at a later date as an outpatient test once Covid 19 situation is clear.   Aianna Fahs, MD  08/18/2018 10:04 AM         .  

## 2018-08-18 NOTE — Progress Notes (Addendum)
Pulmonary Critical Care Medicine [facility] [place]   PULMONARY CRITICAL CARE SERVICE  PROGRESS NOTE  Date of Service: 08/18/2018  [name] [name]  ZOX:[id number]  DOB: 06-20-1955   DOA: 07/02/2018  Referring Physician: Carron Curie, MD  HPI: [name] [name] is a 63 y.o. male seen for follow up of Acute on Chronic Respiratory Failure.  Patient has restarted capping today and is on room air doing well at this time.  Medications: Reviewed on Rounds  Physical Exam:  Vitals: Pulse 69 respirations 33 BP 106/57 O2 sat 95% temp 98.7  Ventilator Settings not currently on ventilator  . General: Comfortable at this time . Eyes: Grossly normal lids, irises & conjunctiva . ENT: grossly tongue is normal . Neck: no obvious mass . Cardiovascular: S1 S2 normal no gallop . Respiratory: No rales or rhonchi noted . Abdomen: soft . Skin: no rash seen on limited exam . Musculoskeletal: not rigid . Psychiatric:unable to assess . Neurologic: no seizure no involuntary movements         Lab Data:   Basic Metabolic Panel: Recent Labs  Lab 08/12/18 0022 08/13/18 0548 08/14/18 0938 08/16/18 0409 08/18/18 0633  NA 147* 147* 145 140 141  K 3.6 3.1* 3.7 4.8 3.6  CL 121* 118* 118* 116* 113*  CO2 18* 21* 19* 15* 18*  GLUCOSE 174* 190* 144* 163* 96  BUN 38* 37* 28* 22 21  CREATININE 0.85 0.93 0.68 0.60* 0.56*  CALCIUM 9.3 8.9 8.2* 8.7* 8.9  MG 2.1  --  1.8 2.1 2.1    ABG: No results for input(s): PHART, PCO2ART, PO2ART, HCO3, O2SAT in the last 168 hours.  Liver Function Tests: Recent Labs  Lab 08/11/18 1846  AST 114*  ALT 133*  ALKPHOS 141*  BILITOT 0.6  PROT 8.2*  ALBUMIN 3.5   No results for input(s): LIPASE, AMYLASE in the last 168 hours. No results for input(s): AMMONIA in the last 168 hours.  CBC: Recent Labs  Lab 08/13/18 0548 08/14/18 0938 08/15/18 0650 08/16/18 0409 08/18/18 0633  WBC 18.3* 10.8* 8.4 6.3 11.8*  HGB 17.2* 14.3 13.4 14.5 13.5  HCT  55.0* 47.6 44.0 45.2 41.6  MCV 88.7 91.4 90.2 90.0 86.3  PLT 149* 91* 87* 92* 143*    Cardiac Enzymes: No results for input(s): CKTOTAL, CKMB, CKMBINDEX, TROPONINI in the last 168 hours.  BNP (last 3 results) No results for input(s): BNP in the last 8760 hours.  ProBNP (last 3 results) No results for input(s): PROBNP in the last 8760 hours.  Radiological Exams: No results found.  Assessment/Plan Active Problems:   Acute on chronic respiratory failure with hypoxia (HCC)   COPD, severe (HCC)   Chronic atrial fibrillation   Acute systolic heart failure (HCC)   Lobar pneumonia, unspecified organism (HCC)   1. Acute on chronic respiratory failure with hypoxia continue with capping trial today continue pulmonary toilet and secretion management 2. Severe COPD at baseline 3. Chronic atrial fibrillation rate controlled 4. Acute systolic heart failure grossly unchanged 5. Pneumonia treated continue supportive measures   I have personally seen and evaluated the patient, evaluated laboratory and imaging results, formulated the assessment and plan and placed orders. The Patient requires high complexity decision making for assessment and support.  Case was discussed on Rounds with the Respiratory Therapy Staff  Yevonne Pax, MD [facility] Pulmonary Critical Care Medicine Sleep Medicine

## 2018-08-19 DIAGNOSIS — I5021 Acute systolic (congestive) heart failure: Secondary | ICD-10-CM | POA: Diagnosis not present

## 2018-08-19 DIAGNOSIS — J9621 Acute and chronic respiratory failure with hypoxia: Secondary | ICD-10-CM | POA: Diagnosis not present

## 2018-08-19 DIAGNOSIS — J449 Chronic obstructive pulmonary disease, unspecified: Secondary | ICD-10-CM | POA: Diagnosis not present

## 2018-08-19 DIAGNOSIS — I482 Chronic atrial fibrillation, unspecified: Secondary | ICD-10-CM | POA: Diagnosis not present

## 2018-08-19 LAB — PROTIME-INR
INR: 1.6 — ABNORMAL HIGH (ref 0.8–1.2)
Prothrombin Time: 18.4 seconds — ABNORMAL HIGH (ref 11.4–15.2)

## 2018-08-19 LAB — VANCOMYCIN, TROUGH: Vancomycin Tr: 19 ug/mL (ref 15–20)

## 2018-08-19 NOTE — Progress Notes (Addendum)
Pulmonary Critical Care Medicine [facility] [place]   PULMONARY CRITICAL CARE SERVICE  PROGRESS NOTE  Date of Service: 08/19/2018  [name] [name]  LKG:[id number]  DOB: 05/26/1955   DOA: 07/02/2018  Referring Physician: Carron Curie, MD  HPI: [name] [name] is a 63 y.o. male seen for follow up of Acute on Chronic Respiratory Failure.  Patient remains on well with no distress  Medications: Reviewed on Rounds  Physical Exam:  Vitals: Pulse 100 respirations 18 BP 109/63 O2 sat 96% temp 97.3  Ventilator Settings not currently on ventilator  . General: Comfortable at this time . Eyes: Grossly normal lids, irises & conjunctiva . ENT: grossly tongue is normal . Neck: no obvious mass . Cardiovascular: S1 S2 normal no gallop . Respiratory: No rales or rhonchi noted . Abdomen: soft . Skin: no rash seen on limited exam . Musculoskeletal: not rigid . Psychiatric:unable to assess . Neurologic: no seizure no involuntary movements         Lab Data:   Basic Metabolic Panel: Recent Labs  Lab 08/13/18 0548 08/14/18 0938 08/16/18 0409 08/18/18 0633  NA 147* 145 140 141  K 3.1* 3.7 4.8 3.6  CL 118* 118* 116* 113*  CO2 21* 19* 15* 18*  GLUCOSE 190* 144* 163* 96  BUN 37* 28* 22 21  CREATININE 0.93 0.68 0.60* 0.56*  CALCIUM 8.9 8.2* 8.7* 8.9  MG  --  1.8 2.1 2.1    ABG: No results for input(s): PHART, PCO2ART, PO2ART, HCO3, O2SAT in the last 168 hours.  Liver Function Tests: No results for input(s): AST, ALT, ALKPHOS, BILITOT, PROT, ALBUMIN in the last 168 hours. No results for input(s): LIPASE, AMYLASE in the last 168 hours. No results for input(s): AMMONIA in the last 168 hours.  CBC: Recent Labs  Lab 08/13/18 0548 08/14/18 0938 08/15/18 0650 08/16/18 0409 08/18/18 0633  WBC 18.3* 10.8* 8.4 6.3 11.8*  HGB 17.2* 14.3 13.4 14.5 13.5  HCT 55.0* 47.6 44.0 45.2 41.6  MCV 88.7 91.4 90.2 90.0 86.3  PLT 149* 91* 87* 92* 143*    Cardiac Enzymes: No results  for input(s): CKTOTAL, CKMB, CKMBINDEX, TROPONINI in the last 168 hours.  BNP (last 3 results) No results for input(s): BNP in the last 8760 hours.  ProBNP (last 3 results) No results for input(s): PROBNP in the last 8760 hours.  Radiological Exams: No results found.  Assessment/Plan Active Problems:   Acute on chronic respiratory failure with hypoxia (HCC)   COPD, severe (HCC)   Chronic atrial fibrillation   Acute systolic heart failure (HCC)   Lobar pneumonia, unspecified organism (HCC)   1. Acute on chronic respiratory failure with hypoxia continue with capping trials as well as pulmonary toilet and secretion management. 2. Severe COPD at baseline 3. Chronic atrial fibrillation rate controlled 4. Acute systolic heart failure course 5. Pneumonia treated continue supportive measures   I have personally seen and evaluated the patient, evaluated laboratory and imaging results, formulated the assessment and plan and placed orders. The Patient requires high complexity decision making for assessment and support.  Case was discussed on Rounds with the Respiratory Therapy Staff  Yevonne Pax, MD [facility] Pulmonary Critical Care Medicine Sleep Medicine

## 2018-08-20 DIAGNOSIS — J9621 Acute and chronic respiratory failure with hypoxia: Secondary | ICD-10-CM | POA: Diagnosis not present

## 2018-08-20 DIAGNOSIS — I482 Chronic atrial fibrillation, unspecified: Secondary | ICD-10-CM | POA: Diagnosis not present

## 2018-08-20 DIAGNOSIS — I5021 Acute systolic (congestive) heart failure: Secondary | ICD-10-CM | POA: Diagnosis not present

## 2018-08-20 DIAGNOSIS — J449 Chronic obstructive pulmonary disease, unspecified: Secondary | ICD-10-CM | POA: Diagnosis not present

## 2018-08-20 LAB — CBC
HCT: 42 % (ref 39.0–52.0)
Hemoglobin: 13.9 g/dL (ref 13.0–17.0)
MCH: 28.5 pg (ref 26.0–34.0)
MCHC: 33.1 g/dL (ref 30.0–36.0)
MCV: 86.2 fL (ref 80.0–100.0)
Platelets: 156 10*3/uL (ref 150–400)
RBC: 4.87 MIL/uL (ref 4.22–5.81)
RDW: 15.9 % — ABNORMAL HIGH (ref 11.5–15.5)
WBC: 13.3 10*3/uL — ABNORMAL HIGH (ref 4.0–10.5)
nRBC: 0 % (ref 0.0–0.2)

## 2018-08-20 LAB — BASIC METABOLIC PANEL
Anion gap: 7 (ref 5–15)
BUN: 16 mg/dL (ref 8–23)
CO2: 19 mmol/L — ABNORMAL LOW (ref 22–32)
Calcium: 8.6 mg/dL — ABNORMAL LOW (ref 8.9–10.3)
Chloride: 113 mmol/L — ABNORMAL HIGH (ref 98–111)
Creatinine, Ser: 0.6 mg/dL — ABNORMAL LOW (ref 0.61–1.24)
GFR calc Af Amer: >60 mL/min (ref >60)
GFR calc non Af Amer: >60 mL/min (ref >60)
Glucose, Bld: 118 mg/dL — ABNORMAL HIGH (ref 70–99)
Potassium: 3.6 mmol/L (ref 3.5–5.1)
Sodium: 139 mmol/L (ref 135–145)

## 2018-08-20 LAB — MAGNESIUM: Magnesium: 1.9 mg/dL (ref 1.7–2.4)

## 2018-08-20 LAB — PHOSPHORUS: Phosphorus: 3.4 mg/dL (ref 2.5–4.6)

## 2018-08-20 LAB — PROTIME-INR
INR: 2.1 — ABNORMAL HIGH (ref 0.8–1.2)
Prothrombin Time: 23.2 seconds — ABNORMAL HIGH (ref 11.4–15.2)

## 2018-08-20 NOTE — Progress Notes (Addendum)
Pulmonary Critical Care Medicine [facility] [place]   PULMONARY CRITICAL CARE SERVICE  PROGRESS NOTE  Date of Service: 08/20/2018  [name] [name]  [id number]  DOB: 06/13/1955   DOA: 07/02/2018  Referring Physician: Carron Curie, MD  HPI: [name] [name] is a 63 y.o. male seen for follow up of Acute on Chronic Respiratory Failure.  Patient has now been capped for 48 hours and is doing well on room air.  Patient will progress with decannulation today.  Medications: Reviewed on Rounds  Physical Exam:  Vitals: Pulse 65 respirations 18 BP 122/75 O2 sat 96% temp 97.7  Ventilator Settings not currently on ventilator  . General: Comfortable at this time . Eyes: Grossly normal lids, irises & conjunctiva . ENT: grossly tongue is normal . Neck: no obvious mass . Cardiovascular: S1 S2 normal no gallop . Respiratory: No rales or rhonchi noted . Abdomen: soft . Skin: no rash seen on limited exam . Musculoskeletal: not rigid . Psychiatric:unable to assess . Neurologic: no seizure no involuntary movements         Lab Data:   Basic Metabolic Panel: Recent Labs  Lab 08/14/18 0938 08/16/18 0409 08/18/18 0633 08/20/18 0816  NA 145 140 141 139  K 3.7 4.8 3.6 3.6  CL 118* 116* 113* 113*  CO2 19* 15* 18* 19*  GLUCOSE 144* 163* 96 118*  BUN 28* 22 21 16   CREATININE 0.68 0.60* 0.56* 0.60*  CALCIUM 8.2* 8.7* 8.9 8.6*  MG 1.8 2.1 2.1 1.9  PHOS  --   --   --  3.4    ABG: No results for input(s): PHART, PCO2ART, PO2ART, HCO3, O2SAT in the last 168 hours.  Liver Function Tests: No results for input(s): AST, ALT, ALKPHOS, BILITOT, PROT, ALBUMIN in the last 168 hours. No results for input(s): LIPASE, AMYLASE in the last 168 hours. No results for input(s): AMMONIA in the last 168 hours.  CBC: Recent Labs  Lab 08/14/18 0938 08/15/18 0650 08/16/18 0409 08/18/18 0633 08/20/18 0816  WBC 10.8* 8.4 6.3 11.8* 13.3*  HGB 14.3 13.4 14.5 13.5 13.9  HCT 47.6 44.0 45.2  41.6 42.0  MCV 91.4 90.2 90.0 86.3 86.2  PLT 91* 87* 92* 143* 156    Cardiac Enzymes: No results for input(s): CKTOTAL, CKMB, CKMBINDEX, TROPONINI in the last 168 hours.  BNP (last 3 results) No results for input(s): BNP in the last 8760 hours.  ProBNP (last 3 results) No results for input(s): PROBNP in the last 8760 hours.  Radiological Exams: No results found.  Assessment/Plan Active Problems:   Acute on chronic respiratory failure with hypoxia (HCC)   COPD, severe (HCC)   Chronic atrial fibrillation   Acute systolic heart failure (HCC)   Lobar pneumonia, unspecified organism (HCC)   1. Acute on chronic respiratory failure with hypoxia patient will be decannulated today.  Continue secretion management and pulmonary toilet 2. Severe COPD at baseline 3. Chronic atrial fibrillation rate controlled 4. Acute systolic heart failure unchanged 5. Pneumonia treated continue supportive measures   I have personally seen and evaluated the patient, evaluated laboratory and imaging results, formulated the assessment and plan and placed orders. The Patient requires high complexity decision making for assessment and support.  Case was discussed on Rounds with the Respiratory Therapy Staff  Yevonne Pax, MD [facility] Pulmonary Critical Care Medicine Sleep Medicine

## 2018-08-21 DIAGNOSIS — I5021 Acute systolic (congestive) heart failure: Secondary | ICD-10-CM | POA: Diagnosis not present

## 2018-08-21 DIAGNOSIS — I482 Chronic atrial fibrillation, unspecified: Secondary | ICD-10-CM | POA: Diagnosis not present

## 2018-08-21 DIAGNOSIS — J449 Chronic obstructive pulmonary disease, unspecified: Secondary | ICD-10-CM | POA: Diagnosis not present

## 2018-08-21 DIAGNOSIS — J9621 Acute and chronic respiratory failure with hypoxia: Secondary | ICD-10-CM | POA: Diagnosis not present

## 2018-08-21 LAB — PROTIME-INR
INR: 2.3 — ABNORMAL HIGH (ref 0.8–1.2)
Prothrombin Time: 24.8 seconds — ABNORMAL HIGH (ref 11.4–15.2)

## 2018-08-21 LAB — TRIGLYCERIDES: Triglycerides: 118 mg/dL (ref <150)

## 2018-08-21 NOTE — Progress Notes (Addendum)
Pulmonary Critical Care Medicine [facility] [place]   PULMONARY CRITICAL CARE SERVICE  PROGRESS NOTE  Date of Service: 08/21/2018  [name]  XLK:[id number]  DOB: Apr 17, 1956   DOA: 07/02/2018  Referring Physician: Carron Curie, MD  HPI: [name] [name] is a 63 y.o. male seen for follow up of Acute on Chronic Respiratory Failure.  Patient remains decannulated on room air doing well at this time.  O2 saturations greater than 99%.  Medications: Reviewed on Rounds  Physical Exam:  Vitals: Pulse 75 respirations 28 BP 104/63 O2 sat 93% temp 97.4  Ventilator Settings not currently on ventilator  . General: Comfortable at this time . Eyes: Grossly normal lids, irises & conjunctiva . ENT: grossly tongue is normal . Neck: no obvious mass . Cardiovascular: S1 S2 normal no gallop . Respiratory: No rales or rhonchi noted . Abdomen: soft . Skin: no rash seen on limited exam . Musculoskeletal: not rigid . Psychiatric:unable to assess . Neurologic: no seizure no involuntary movements         Lab Data:   Basic Metabolic Panel: Recent Labs  Lab 08/16/18 0409 08/18/18 0633 08/20/18 0816  NA 140 141 139  K 4.8 3.6 3.6  CL 116* 113* 113*  CO2 15* 18* 19*  GLUCOSE 163* 96 118*  BUN 22 21 16   CREATININE 0.60* 0.56* 0.60*  CALCIUM 8.7* 8.9 8.6*  MG 2.1 2.1 1.9  PHOS  --   --  3.4    ABG: No results for input(s): PHART, PCO2ART, PO2ART, HCO3, O2SAT in the last 168 hours.  Liver Function Tests: No results for input(s): AST, ALT, ALKPHOS, BILITOT, PROT, ALBUMIN in the last 168 hours. No results for input(s): LIPASE, AMYLASE in the last 168 hours. No results for input(s): AMMONIA in the last 168 hours.  CBC: Recent Labs  Lab 08/15/18 0650 08/16/18 0409 08/18/18 0633 08/20/18 0816  WBC 8.4 6.3 11.8* 13.3*  HGB 13.4 14.5 13.5 13.9  HCT 44.0 45.2 41.6 42.0  MCV 90.2 90.0 86.3 86.2  PLT 87* 92* 143* 156    Cardiac Enzymes: No results for input(s):  CKTOTAL, CKMB, CKMBINDEX, TROPONINI in the last 168 hours.  BNP (last 3 results) No results for input(s): BNP in the last 8760 hours.  ProBNP (last 3 results) No results for input(s): PROBNP in the last 8760 hours.  Radiological Exams: No results found.  Assessment/Plan Active Problems:   Acute on chronic respiratory failure with hypoxia (HCC)   COPD, severe (HCC)   Chronic atrial fibrillation   Acute systolic heart failure (HCC)   Lobar pneumonia, unspecified organism (HCC)   1. Acute on chronic respiratory failure with hypoxia patient was decannulated yesterday and doing well on room air at this time.  Continue pulmonary toilet and secretion management 2. Severe COPD at baseline 3. Chronic atrial fibrillation rate controlled 4. Acute systolic heart failure unchanged 5. Pneumonia treated continue supportive measures   I have personally seen and evaluated the patient, evaluated laboratory and imaging results, formulated the assessment and plan and placed orders. The Patient requires high complexity decision making for assessment and support.  Case was discussed on Rounds with the Respiratory Therapy Staff  Yevonne Pax, MD [facility] Pulmonary Critical Care Medicine Sleep Medicine \

## 2018-08-22 DIAGNOSIS — I482 Chronic atrial fibrillation, unspecified: Secondary | ICD-10-CM | POA: Diagnosis not present

## 2018-08-22 DIAGNOSIS — J9621 Acute and chronic respiratory failure with hypoxia: Secondary | ICD-10-CM | POA: Diagnosis not present

## 2018-08-22 DIAGNOSIS — I5021 Acute systolic (congestive) heart failure: Secondary | ICD-10-CM | POA: Diagnosis not present

## 2018-08-22 DIAGNOSIS — J449 Chronic obstructive pulmonary disease, unspecified: Secondary | ICD-10-CM | POA: Diagnosis not present

## 2018-08-22 LAB — CBC
HCT: 46.5 % (ref 39.0–52.0)
Hemoglobin: 15 g/dL (ref 13.0–17.0)
MCH: 28.1 pg (ref 26.0–34.0)
MCHC: 32.3 g/dL (ref 30.0–36.0)
MCV: 87.2 fL (ref 80.0–100.0)
Platelets: 176 10*3/uL (ref 150–400)
RBC: 5.33 MIL/uL (ref 4.22–5.81)
RDW: 16.4 % — ABNORMAL HIGH (ref 11.5–15.5)
WBC: 10.8 10*3/uL — ABNORMAL HIGH (ref 4.0–10.5)
nRBC: 0 % (ref 0.0–0.2)

## 2018-08-22 LAB — MAGNESIUM: Magnesium: 1.8 mg/dL (ref 1.7–2.4)

## 2018-08-22 LAB — BASIC METABOLIC PANEL
Anion gap: 12 (ref 5–15)
BUN: 11 mg/dL (ref 8–23)
CO2: 17 mmol/L — ABNORMAL LOW (ref 22–32)
Calcium: 8.9 mg/dL (ref 8.9–10.3)
Chloride: 109 mmol/L (ref 98–111)
Creatinine, Ser: 0.65 mg/dL (ref 0.61–1.24)
GFR calc Af Amer: >60 mL/min (ref >60)
GFR calc non Af Amer: >60 mL/min (ref >60)
Glucose, Bld: 113 mg/dL — ABNORMAL HIGH (ref 70–99)
Potassium: 3.5 mmol/L (ref 3.5–5.1)
Sodium: 138 mmol/L (ref 135–145)

## 2018-08-22 LAB — PROTIME-INR
INR: 3.1 — ABNORMAL HIGH (ref 0.8–1.2)
Prothrombin Time: 31.6 seconds — ABNORMAL HIGH (ref 11.4–15.2)

## 2018-08-22 LAB — PHOSPHORUS: Phosphorus: 3.6 mg/dL (ref 2.5–4.6)

## 2018-08-22 NOTE — Progress Notes (Addendum)
Pulmonary Critical Care Medicine [facility] [place]   PULMONARY CRITICAL CARE SERVICE  PROGRESS NOTE  Date of Service: 08/22/2018  Jonael Darley  [id number]  DOB: 1955-07-27   DOA: 07/02/2018  Referring Physician: Carron Curie, MD  HPI: Ovie Sitzes is a 63 y.o. male seen for follow up of Acute on Chronic Respiratory Failure.  Patient remains on room air at this time.  Has been decannulated with no distress noted at this time.  Pulmonary will sign off on patient.  We are available for further consultation as needed.  Medications: Reviewed on Rounds  Physical Exam:  Vitals: Pulse 96 respirations 18 BP 112/53 O2 sat 96% temp 93.2  Ventilator Settings not currently ventilator  . General: Comfortable at this time . Eyes: Grossly normal lids, irises & conjunctiva . ENT: grossly tongue is normal . Neck: no obvious mass . Cardiovascular: S1 S2 normal no gallop . Respiratory: No rales or rhonchi noted . Abdomen: soft . Skin: no rash seen on limited exam . Musculoskeletal: not rigid . Psychiatric:unable to assess . Neurologic: no seizure no involuntary movements         Lab Data:   Basic Metabolic Panel: Recent Labs  Lab 08/16/18 0409 08/18/18 0633 08/20/18 0816 08/22/18 0543  NA 140 141 139 138  K 4.8 3.6 3.6 3.5  CL 116* 113* 113* 109  CO2 15* 18* 19* 17*  GLUCOSE 163* 96 118* 113*  BUN 22 21 16 11   CREATININE 0.60* 0.56* 0.60* 0.65  CALCIUM 8.7* 8.9 8.6* 8.9  MG 2.1 2.1 1.9 1.8  PHOS  --   --  3.4 3.6    ABG: No results for input(s): PHART, PCO2ART, PO2ART, HCO3, O2SAT in the last 168 hours.  Liver Function Tests: No results for input(s): AST, ALT, ALKPHOS, BILITOT, PROT, ALBUMIN in the last 168 hours. No results for input(s): LIPASE, AMYLASE in the last 168 hours. No results for input(s): AMMONIA in the last 168 hours.  CBC: Recent Labs  Lab 08/16/18 0409 08/18/18 0633 08/20/18 0816 08/22/18 0543  WBC 6.3 11.8* 13.3* 10.8*  HGB  14.5 13.5 13.9 15.0  HCT 45.2 41.6 42.0 46.5  MCV 90.0 86.3 86.2 87.2  PLT 92* 143* 156 176    Cardiac Enzymes: No results for input(s): CKTOTAL, CKMB, CKMBINDEX, TROPONINI in the last 168 hours.  BNP (last 3 results) No results for input(s): BNP in the last 8760 hours.  ProBNP (last 3 results) No results for input(s): PROBNP in the last 8760 hours.  Radiological Exams: No results found.  Assessment/Plan Active Problems:   Acute on chronic respiratory failure with hypoxia (HCC)   COPD, severe (HCC)   Chronic atrial fibrillation   Acute systolic heart failure (HCC)   Lobar pneumonia, unspecified organism (HCC)   1. Acute on chronic respiratory failure with hypoxia patient has been decannulated for 3 days and is doing well on room air at this time.  Continue pulmonary toilet and secretion management.  Will sign off on patient and be able for further consultation as needed. 2. Severe COPD at baseline 3. Chronic atrial fibrillation rate controlled 4. Acute systolic heart failure unchanged 5. We will treated continue supportive measures   I have personally seen and evaluated the patient, evaluated laboratory and imaging results, formulated the assessment and plan and placed orders. The Patient requires high complexity decision making for assessment and support.  Case was discussed on Rounds with the Respiratory Therapy Staff  Yevonne Pax, MD [facility] Pulmonary Critical Care  Medicine Sleep Medicine

## 2018-08-23 LAB — PROTIME-INR
INR: 2.4 — ABNORMAL HIGH (ref 0.8–1.2)
Prothrombin Time: 26 seconds — ABNORMAL HIGH (ref 11.4–15.2)

## 2018-08-24 LAB — PROTIME-INR
INR: 2.6 — ABNORMAL HIGH (ref 0.8–1.2)
Prothrombin Time: 27.8 seconds — ABNORMAL HIGH (ref 11.4–15.2)

## 2018-08-24 LAB — VANCOMYCIN, TROUGH: Vancomycin Tr: 26 ug/mL (ref 15–20)

## 2018-08-24 LAB — DIGOXIN LEVEL: Digoxin Level: <0.2 ng/mL — ABNORMAL LOW (ref 0.8–2.0)

## 2018-08-25 LAB — PROTIME-INR
INR: 2 — ABNORMAL HIGH (ref 0.8–1.2)
Prothrombin Time: 22.1 seconds — ABNORMAL HIGH (ref 11.4–15.2)

## 2018-08-25 LAB — VANCOMYCIN, TROUGH: Vancomycin Tr: 16 ug/mL (ref 15–20)

## 2018-08-26 ENCOUNTER — Other Ambulatory Visit (HOSPITAL_COMMUNITY): Payer: Medicare Other

## 2018-08-26 LAB — CBC
HCT: 43.1 % (ref 39.0–52.0)
HCT: 42.1 % (ref 39.0–52.0)
Hemoglobin: 14.3 g/dL (ref 13.0–17.0)
Hemoglobin: 14.2 g/dL (ref 13.0–17.0)
MCH: 29.1 pg (ref 26.0–34.0)
MCH: 28.7 pg (ref 26.0–34.0)
MCHC: 33.2 g/dL (ref 30.0–36.0)
MCHC: 33.7 g/dL (ref 30.0–36.0)
MCV: 87.6 fL (ref 80.0–100.0)
MCV: 85.1 fL (ref 80.0–100.0)
Platelets: 175 10*3/uL (ref 150–400)
Platelets: 177 10*3/uL (ref 150–400)
RBC: 4.92 MIL/uL (ref 4.22–5.81)
RBC: 4.95 MIL/uL (ref 4.22–5.81)
RDW: 16.9 % — ABNORMAL HIGH (ref 11.5–15.5)
RDW: 16.6 % — ABNORMAL HIGH (ref 11.5–15.5)
WBC: 7.1 10*3/uL (ref 4.0–10.5)
WBC: 7.3 10*3/uL (ref 4.0–10.5)
nRBC: 0 % (ref 0.0–0.2)
nRBC: 0 % (ref 0.0–0.2)

## 2018-08-26 LAB — BASIC METABOLIC PANEL
Anion gap: 11 (ref 5–15)
Anion gap: 9 (ref 5–15)
BUN: 14 mg/dL (ref 8–23)
BUN: 15 mg/dL (ref 8–23)
CO2: 18 mmol/L — ABNORMAL LOW (ref 22–32)
CO2: 18 mmol/L — ABNORMAL LOW (ref 22–32)
Calcium: 9.1 mg/dL (ref 8.9–10.3)
Calcium: 9.1 mg/dL (ref 8.9–10.3)
Chloride: 107 mmol/L (ref 98–111)
Chloride: 107 mmol/L (ref 98–111)
Creatinine, Ser: 0.7 mg/dL (ref 0.61–1.24)
Creatinine, Ser: 0.65 mg/dL (ref 0.61–1.24)
GFR calc Af Amer: >60 mL/min (ref >60)
GFR calc Af Amer: >60 mL/min (ref >60)
GFR calc non Af Amer: >60 mL/min (ref >60)
GFR calc non Af Amer: >60 mL/min (ref >60)
Glucose, Bld: 114 mg/dL — ABNORMAL HIGH (ref 70–99)
Glucose, Bld: 104 mg/dL — ABNORMAL HIGH (ref 70–99)
Potassium: 3.7 mmol/L (ref 3.5–5.1)
Potassium: 3.6 mmol/L (ref 3.5–5.1)
Sodium: 136 mmol/L (ref 135–145)
Sodium: 134 mmol/L — ABNORMAL LOW (ref 135–145)

## 2018-08-26 LAB — PROTIME-INR
INR: 1.3 — ABNORMAL HIGH (ref 0.8–1.2)
Prothrombin Time: 16.4 seconds — ABNORMAL HIGH (ref 11.4–15.2)

## 2018-08-26 LAB — MAGNESIUM
Magnesium: 2 mg/dL (ref 1.7–2.4)
Magnesium: 1.9 mg/dL (ref 1.7–2.4)

## 2018-08-27 ENCOUNTER — Encounter: Payer: Self-pay | Admitting: Physical Medicine and Rehabilitation

## 2018-08-27 LAB — VANCOMYCIN, TROUGH: Vancomycin Tr: 18 ug/mL (ref 15–20)

## 2018-08-27 LAB — PROTIME-INR
INR: 1.2 (ref 0.8–1.2)
Prothrombin Time: 15.3 seconds — ABNORMAL HIGH (ref 11.4–15.2)

## 2018-08-28 ENCOUNTER — Inpatient Hospital Stay (HOSPITAL_COMMUNITY)
Admission: RE | Admit: 2018-08-28 | Discharge: 2018-09-06 | DRG: 945 | Disposition: A | Discharge status: Home Health | Payer: Medicare Other | Source: Other Acute Inpatient Hospital | Attending: Physical Medicine & Rehabilitation | Admitting: Physical Medicine & Rehabilitation

## 2018-08-28 ENCOUNTER — Encounter (HOSPITAL_COMMUNITY): Payer: Self-pay

## 2018-08-28 ENCOUNTER — Other Ambulatory Visit: Payer: Self-pay

## 2018-08-28 ENCOUNTER — Inpatient Hospital Stay (HOSPITAL_COMMUNITY): Payer: Medicare Other

## 2018-08-28 DIAGNOSIS — I482 Chronic atrial fibrillation, unspecified: Secondary | ICD-10-CM | POA: Diagnosis present

## 2018-08-28 DIAGNOSIS — R5381 Other malaise: Secondary | ICD-10-CM | POA: Diagnosis not present

## 2018-08-28 DIAGNOSIS — I82621 Acute embolism and thrombosis of deep veins of right upper extremity: Secondary | ICD-10-CM

## 2018-08-28 DIAGNOSIS — Z89612 Acquired absence of left leg above knee: Secondary | ICD-10-CM

## 2018-08-28 DIAGNOSIS — F4312 Post-traumatic stress disorder, chronic: Secondary | ICD-10-CM | POA: Diagnosis present

## 2018-08-28 DIAGNOSIS — R188 Other ascites: Secondary | ICD-10-CM | POA: Diagnosis present

## 2018-08-28 DIAGNOSIS — Z791 Long term (current) use of non-steroidal anti-inflammatories (NSAID): Secondary | ICD-10-CM

## 2018-08-28 DIAGNOSIS — L539 Erythematous condition, unspecified: Secondary | ICD-10-CM | POA: Diagnosis present

## 2018-08-28 DIAGNOSIS — B1920 Unspecified viral hepatitis C without hepatic coma: Secondary | ICD-10-CM | POA: Diagnosis present

## 2018-08-28 DIAGNOSIS — F410 Panic disorder [episodic paroxysmal anxiety] without agoraphobia: Secondary | ICD-10-CM | POA: Diagnosis present

## 2018-08-28 DIAGNOSIS — I5022 Chronic systolic (congestive) heart failure: Secondary | ICD-10-CM | POA: Diagnosis present

## 2018-08-28 DIAGNOSIS — E876 Hypokalemia: Secondary | ICD-10-CM | POA: Diagnosis present

## 2018-08-28 DIAGNOSIS — Z7982 Long term (current) use of aspirin: Secondary | ICD-10-CM

## 2018-08-28 DIAGNOSIS — M549 Dorsalgia, unspecified: Secondary | ICD-10-CM | POA: Diagnosis present

## 2018-08-28 DIAGNOSIS — Z89611 Acquired absence of right leg above knee: Secondary | ICD-10-CM | POA: Diagnosis not present

## 2018-08-28 DIAGNOSIS — J189 Pneumonia, unspecified organism: Secondary | ICD-10-CM | POA: Diagnosis present

## 2018-08-28 DIAGNOSIS — I110 Hypertensive heart disease with heart failure: Secondary | ICD-10-CM | POA: Diagnosis present

## 2018-08-28 DIAGNOSIS — Z91048 Other nonmedicinal substance allergy status: Secondary | ICD-10-CM | POA: Diagnosis not present

## 2018-08-28 DIAGNOSIS — R7303 Prediabetes: Secondary | ICD-10-CM | POA: Diagnosis not present

## 2018-08-28 DIAGNOSIS — Z8249 Family history of ischemic heart disease and other diseases of the circulatory system: Secondary | ICD-10-CM

## 2018-08-28 DIAGNOSIS — B351 Tinea unguium: Secondary | ICD-10-CM | POA: Diagnosis not present

## 2018-08-28 DIAGNOSIS — E119 Type 2 diabetes mellitus without complications: Secondary | ICD-10-CM | POA: Diagnosis not present

## 2018-08-28 DIAGNOSIS — Y95 Nosocomial condition: Secondary | ICD-10-CM | POA: Diagnosis present

## 2018-08-28 DIAGNOSIS — Z888 Allergy status to other drugs, medicaments and biological substances status: Secondary | ICD-10-CM

## 2018-08-28 DIAGNOSIS — J440 Chronic obstructive pulmonary disease with acute lower respiratory infection: Secondary | ICD-10-CM | POA: Diagnosis present

## 2018-08-28 DIAGNOSIS — F319 Bipolar disorder, unspecified: Secondary | ICD-10-CM | POA: Diagnosis present

## 2018-08-28 DIAGNOSIS — Z09 Encounter for follow-up examination after completed treatment for conditions other than malignant neoplasm: Secondary | ICD-10-CM

## 2018-08-28 DIAGNOSIS — E1165 Type 2 diabetes mellitus with hyperglycemia: Secondary | ICD-10-CM | POA: Diagnosis present

## 2018-08-28 DIAGNOSIS — J449 Chronic obstructive pulmonary disease, unspecified: Secondary | ICD-10-CM | POA: Diagnosis not present

## 2018-08-28 DIAGNOSIS — Z833 Family history of diabetes mellitus: Secondary | ICD-10-CM

## 2018-08-28 DIAGNOSIS — N319 Neuromuscular dysfunction of bladder, unspecified: Secondary | ICD-10-CM | POA: Diagnosis present

## 2018-08-28 DIAGNOSIS — Z91040 Latex allergy status: Secondary | ICD-10-CM

## 2018-08-28 DIAGNOSIS — G40909 Epilepsy, unspecified, not intractable, without status epilepticus: Secondary | ICD-10-CM | POA: Diagnosis present

## 2018-08-28 DIAGNOSIS — R791 Abnormal coagulation profile: Secondary | ICD-10-CM | POA: Diagnosis not present

## 2018-08-28 DIAGNOSIS — R1310 Dysphagia, unspecified: Secondary | ICD-10-CM

## 2018-08-28 DIAGNOSIS — Z87820 Personal history of traumatic brain injury: Secondary | ICD-10-CM

## 2018-08-28 DIAGNOSIS — I420 Dilated cardiomyopathy: Secondary | ICD-10-CM | POA: Diagnosis present

## 2018-08-28 DIAGNOSIS — I4891 Unspecified atrial fibrillation: Secondary | ICD-10-CM

## 2018-08-28 DIAGNOSIS — Z87442 Personal history of urinary calculi: Secondary | ICD-10-CM

## 2018-08-28 DIAGNOSIS — Z79899 Other long term (current) drug therapy: Secondary | ICD-10-CM

## 2018-08-28 DIAGNOSIS — Z931 Gastrostomy status: Secondary | ICD-10-CM | POA: Diagnosis not present

## 2018-08-28 DIAGNOSIS — I330 Acute and subacute infective endocarditis: Secondary | ICD-10-CM | POA: Diagnosis present

## 2018-08-28 DIAGNOSIS — Z880 Allergy status to penicillin: Secondary | ICD-10-CM

## 2018-08-28 DIAGNOSIS — Z452 Encounter for adjustment and management of vascular access device: Secondary | ICD-10-CM

## 2018-08-28 DIAGNOSIS — G8929 Other chronic pain: Secondary | ICD-10-CM | POA: Diagnosis present

## 2018-08-28 DIAGNOSIS — I700 Atherosclerosis of aorta: Secondary | ICD-10-CM | POA: Diagnosis present

## 2018-08-28 DIAGNOSIS — Z431 Encounter for attention to gastrostomy: Secondary | ICD-10-CM

## 2018-08-28 DIAGNOSIS — I252 Old myocardial infarction: Secondary | ICD-10-CM

## 2018-08-28 DIAGNOSIS — F17210 Nicotine dependence, cigarettes, uncomplicated: Secondary | ICD-10-CM | POA: Diagnosis present

## 2018-08-28 HISTORY — DX: Other malaise: R53.81

## 2018-08-28 LAB — GLUCOSE, CAPILLARY
Glucose-Capillary: 108 mg/dL — ABNORMAL HIGH (ref 70–99)
Glucose-Capillary: 101 mg/dL — ABNORMAL HIGH (ref 70–99)

## 2018-08-28 LAB — PROTIME-INR
INR: 1.6 — ABNORMAL HIGH (ref 0.8–1.2)
Prothrombin Time: 19.1 seconds — ABNORMAL HIGH (ref 11.4–15.2)

## 2018-08-28 MED ORDER — SERTRALINE HCL 50 MG PO TABS
50.0000 mg | ORAL_TABLET | Freq: Every day | ORAL | Status: DC
  Administered 2018-08-29 – 2018-09-06 (×8): 50 mg via ORAL
  Filled 2018-08-28 (×9): qty 1

## 2018-08-28 MED ORDER — DIPHENHYDRAMINE HCL 12.5 MG/5ML PO ELIX: 12.5000 mg | ORAL_SOLUTION | Freq: Four times a day (QID) | ORAL | Status: DC | PRN

## 2018-08-28 MED ORDER — BISACODYL 10 MG RE SUPP: 10.0000 mg | Freq: Every day | RECTAL | Status: DC | PRN

## 2018-08-28 MED ORDER — BACITRACIN ZINC 500 UNIT/GM EX OINT
TOPICAL_OINTMENT | Freq: Two times a day (BID) | CUTANEOUS | Status: DC
  Administered 2018-08-28 – 2018-08-31 (×6): via TOPICAL
  Administered 2018-09-01: 1 via TOPICAL
  Administered 2018-09-01 – 2018-09-06 (×10): via TOPICAL
  Filled 2018-08-28: qty 28.4

## 2018-08-28 MED ORDER — WARFARIN - PHARMACIST DOSING INPATIENT
Freq: Every day | Status: DC
  Administered 2018-09-01 – 2018-09-02 (×2)

## 2018-08-28 MED ORDER — ALUM & MAG HYDROXIDE-SIMETH 200-200-20 MG/5ML PO SUSP: 30.0000 mL | ORAL | Status: DC | PRN

## 2018-08-28 MED ORDER — METOPROLOL TARTRATE 25 MG PO TABS
37.5000 mg | ORAL_TABLET | Freq: Three times a day (TID) | ORAL | Status: DC
  Administered 2018-08-28 – 2018-08-30 (×6): 37.5 mg via ORAL
  Filled 2018-08-28 (×10): qty 1

## 2018-08-28 MED ORDER — POLYETHYLENE GLYCOL 3350 17 G PO PACK
17.0000 g | PACK | Freq: Every day | ORAL | Status: DC
  Filled 2018-08-28 (×6): qty 1

## 2018-08-28 MED ORDER — ADULT MULTIVITAMIN W/MINERALS CH
1.0000 | ORAL_TABLET | Freq: Every day | ORAL | Status: DC
  Administered 2018-08-29 – 2018-09-06 (×8): 1 via ORAL
  Filled 2018-08-28 (×9): qty 1

## 2018-08-28 MED ORDER — LIDOCAINE HCL URETHRAL/MUCOSAL 2 % EX GEL
CUTANEOUS | Status: DC | PRN
  Filled 2018-08-28: qty 5

## 2018-08-28 MED ORDER — OXYCODONE HCL 5 MG PO TABS
5.0000 mg | ORAL_TABLET | Freq: Four times a day (QID) | ORAL | Status: DC | PRN
  Administered 2018-08-28 – 2018-09-03 (×11): 5 mg via ORAL
  Filled 2018-08-28 (×12): qty 1

## 2018-08-28 MED ORDER — POLYETHYLENE GLYCOL 3350 17 G PO PACK: 17.0000 g | PACK | Freq: Every day | ORAL | Status: DC | PRN

## 2018-08-28 MED ORDER — FLEET ENEMA 7-19 GM/118ML RE ENEM: 1.0000 | ENEMA | Freq: Once | RECTAL | Status: DC | PRN

## 2018-08-28 MED ORDER — PROCHLORPERAZINE EDISYLATE 10 MG/2ML IJ SOLN: 5.0000 mg | Freq: Four times a day (QID) | INTRAMUSCULAR | Status: DC | PRN

## 2018-08-28 MED ORDER — AMITRIPTYLINE HCL 25 MG PO TABS
12.5000 mg | ORAL_TABLET | Freq: Every day | ORAL | Status: DC
  Administered 2018-08-28 – 2018-09-05 (×9): 12.5 mg via ORAL
  Filled 2018-08-28 (×10): qty 0.5

## 2018-08-28 MED ORDER — VITAMIN D 25 MCG (1000 UNIT) PO TABS
2000.0000 [IU] | ORAL_TABLET | Freq: Every day | ORAL | Status: DC
  Administered 2018-08-29 – 2018-09-06 (×8): 2000 [IU] via ORAL
  Filled 2018-08-28 (×9): qty 2

## 2018-08-28 MED ORDER — RIFAMPIN 300 MG PO CAPS
300.0000 mg | ORAL_CAPSULE | Freq: Every day | ORAL | Status: DC
  Administered 2018-08-29 – 2018-08-30 (×2): 300 mg via ORAL
  Filled 2018-08-28 (×3): qty 1

## 2018-08-28 MED ORDER — WARFARIN SODIUM 5 MG PO TABS: 10.0000 mg | ORAL_TABLET | Freq: Once | ORAL | Status: DC

## 2018-08-28 MED ORDER — VANCOMYCIN HCL 1000 MG IV SOLR
1000.0000 mg | Freq: Three times a day (TID) | INTRAVENOUS | Status: DC
  Filled 2018-08-28 (×3): qty 1000

## 2018-08-28 MED ORDER — AMIODARONE HCL 200 MG PO TABS
100.0000 mg | ORAL_TABLET | Freq: Two times a day (BID) | ORAL | Status: DC
  Administered 2018-08-28 – 2018-09-06 (×17): 100 mg via ORAL
  Filled 2018-08-28 (×18): qty 1

## 2018-08-28 MED ORDER — QUETIAPINE FUMARATE 50 MG PO TABS
100.0000 mg | ORAL_TABLET | Freq: Every day | ORAL | Status: DC
  Administered 2018-08-28 – 2018-09-05 (×9): 100 mg via ORAL
  Filled 2018-08-28 (×9): qty 2

## 2018-08-28 MED ORDER — TAMSULOSIN HCL 0.4 MG PO CAPS
0.4000 mg | ORAL_CAPSULE | Freq: Every day | ORAL | Status: DC
  Administered 2018-08-28 – 2018-09-05 (×9): 0.4 mg via ORAL
  Filled 2018-08-28 (×9): qty 1

## 2018-08-28 MED ORDER — CLONAZEPAM 0.25 MG PO TBDP
0.5000 mg | ORAL_TABLET | Freq: Two times a day (BID) | ORAL | Status: DC
  Administered 2018-08-28 – 2018-09-06 (×17): 0.5 mg via ORAL
  Filled 2018-08-28 (×19): qty 2

## 2018-08-28 MED ORDER — VITAMIN B-1 100 MG PO TABS
100.0000 mg | ORAL_TABLET | Freq: Every day | ORAL | Status: DC
  Administered 2018-08-29 – 2018-09-06 (×8): 100 mg via ORAL
  Filled 2018-08-28 (×9): qty 1

## 2018-08-28 MED ORDER — FOLIC ACID 1 MG PO TABS
1.0000 mg | ORAL_TABLET | Freq: Every day | ORAL | Status: DC
  Administered 2018-08-29 – 2018-09-06 (×8): 1 mg via ORAL
  Filled 2018-08-28 (×9): qty 1

## 2018-08-28 MED ORDER — GLYCOPYRROLATE 1 MG PO TABS
1.0000 mg | ORAL_TABLET | Freq: Two times a day (BID) | ORAL | Status: DC
  Administered 2018-08-28 – 2018-09-06 (×17): 1 mg via ORAL
  Filled 2018-08-28 (×20): qty 1

## 2018-08-28 MED ORDER — QUETIAPINE FUMARATE 50 MG PO TABS
50.0000 mg | ORAL_TABLET | Freq: Every day | ORAL | Status: DC
  Administered 2018-08-29 – 2018-09-06 (×8): 50 mg via ORAL
  Filled 2018-08-28 (×9): qty 1

## 2018-08-28 MED ORDER — GUAIFENESIN-DM 100-10 MG/5ML PO SYRP: 5.0000 mL | ORAL_SOLUTION | Freq: Four times a day (QID) | ORAL | Status: DC | PRN

## 2018-08-28 MED ORDER — PROCHLORPERAZINE 25 MG RE SUPP: 12.5000 mg | Freq: Four times a day (QID) | RECTAL | Status: DC | PRN

## 2018-08-28 MED ORDER — IPRATROPIUM-ALBUTEROL 0.5-2.5 (3) MG/3ML IN SOLN: 3.0000 mL | RESPIRATORY_TRACT | Status: DC | PRN

## 2018-08-28 MED ORDER — VITAMIN B-1 100 MG PO TABS: 500.0000 mg | ORAL_TABLET | Freq: Every day | ORAL | Status: DC

## 2018-08-28 MED ORDER — METHOCARBAMOL 500 MG PO TABS: 500.0000 mg | ORAL_TABLET | Freq: Four times a day (QID) | ORAL | Status: DC | PRN

## 2018-08-28 MED ORDER — DIGOXIN 125 MCG PO TABS
0.1250 mg | ORAL_TABLET | Freq: Every day | ORAL | Status: DC
  Administered 2018-08-29 – 2018-09-06 (×8): 0.125 mg via ORAL
  Filled 2018-08-28 (×9): qty 1

## 2018-08-28 MED ORDER — VANCOMYCIN HCL IN DEXTROSE 1-5 GM/200ML-% IV SOLN
1000.0000 mg | Freq: Three times a day (TID) | INTRAVENOUS | Status: DC
  Administered 2018-08-29 – 2018-09-01 (×9): 1000 mg via INTRAVENOUS
  Filled 2018-08-28 (×17): qty 200

## 2018-08-28 MED ORDER — TOPIRAMATE 100 MG PO TABS
200.0000 mg | ORAL_TABLET | Freq: Three times a day (TID) | ORAL | Status: DC
  Administered 2018-08-28 – 2018-08-29 (×2): 200 mg via ORAL
  Filled 2018-08-28 (×2): qty 8
  Filled 2018-08-28: qty 2
  Filled 2018-08-28: qty 8
  Filled 2018-08-28: qty 2

## 2018-08-28 MED ORDER — PRO-STAT SUGAR FREE PO LIQD
30.0000 mL | Freq: Two times a day (BID) | ORAL | Status: DC
  Administered 2018-08-29 – 2018-09-06 (×7): 30 mL via ORAL
  Filled 2018-08-28 (×15): qty 30

## 2018-08-28 MED ORDER — PROCHLORPERAZINE MALEATE 5 MG PO TABS: 5.0000 mg | ORAL_TABLET | Freq: Four times a day (QID) | ORAL | Status: DC | PRN
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
## 2018-08-28 NOTE — Progress Notes (Signed)
Pharmacy Antibiotic Note  [name] [name] is a 63 y.o. male admitted on [facility]/28/[facility]. He is on vancomycin for MRSE endocarditis. Pharmacy has been consulted for vancomycin dosing. Last day of therapy is noted as 5/17 -SCr= 0.65 on 4/21 -prior vancomycin dose at [facility]: 1gm IV q8h (last dose 5:30am on 4/23) -Vancomycin trough on 4/22: 18  Plan: -Continue vancomycin 1gm IV q8h -Will follow renal function, cultures and clinical progress      Temp (24hrs), Avg:98 F (36.7 C), Min:98 F (36.7 C), Max:98 F (36.7 C)  Recent Labs  Lab 08/22/18 0543  08/25/18 2134 08/26/18 0537 08/26/18 1314 08/27/18 1311  WBC 10.8*  --   --  7.1 7.3  --   CREATININE 0.65  --   --  0.70 0.65  --   VANCOTROUGH  --    < > 16  --   --  18   < > = values in this interval not displayed.    CrCl cannot be calculated (Unknown ideal weight.).    Allergies  Allergen Reactions  . Penicillins Anaphylaxis    Tolerated cefepime and ceftriaxone  Has patient had a PCN reaction causing immediate rash, facial/tongue/throat swelling, SOB or lightheadedness with hypotension: Yes Has patient had a PCN reaction causing severe rash involving mucus membranes or skin necrosis: No Has patient had a PCN reaction that required hospitalization No Has patient had a PCN reaction occurring within the last 10 years: No If all of the above answers are "NO", then may proceed with Cephalosporin use.  Marland Kitchen Penicillins Swelling    SWELLING REACTION UNSPECIFIED   . Prednisone Anaphylaxis  . Prednisone Swelling    THROAT  . Acetaminophen Other (See Comments)    Affects liver  . Tramadol Nausea Only and Other (See Comments)     Nausea and eyes were bloodshot red  . Latex Itching    Pt. Stated  Latex was indicated during hypersensitivity panel  . Tylenol [Acetaminophen]     Due to liver function per patient  . Latex Rash  . Other Rash    Plastic  . Trazodone Other (See Comments)    Made eyes red      Antimicrobials this admission: Vanc 4/6>> (5/17)  Harland German, PharmD Clinical Pharmacist **Pharmacist phone directory can now be found on [email or web address] ([email or web address]).  Listed under [facility].

## 2018-08-28 NOTE — Progress Notes (Signed)
Patient arrived and oriented to unit. No questions or complaints at this time. Foley catheter in place. Foley removed per PA order.
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
## 2018-08-28 NOTE — IPOC Note (Signed)
Overall Plan of Care [facility]) Patient Details Name: [name] [name] MRN: [id number] DOB: 30-Jun-1955  Admitting Diagnosis: Debility  Hospital Problems: Active Problems:   S/P bilateral above knee amputation (HCC)   COPD, severe (HCC)   Chronic atrial fibrillation   Debility   Hypokalemia   Atrial fibrillation with rapid ventricular response (HCC)   Acute bacterial endocarditis   Acute deep vein thrombosis (DVT) of right upper extremity (HCC)     Functional Problem List: Nursing Bladder, Bowel, Medication Management, Endurance, Sensory, Safety, Skin Integrity  PT Balance, Endurance, Motor, Pain, Sensory, Skin Integrity, Safety  OT Balance, Endurance, Safety, Pain, Motor, Skin Integrity  SLP    TR         Basic ADL's: OT Grooming, Bathing, Dressing, Toileting     Advanced  ADL's: OT Simple Meal Preparation     Transfers: PT Bed Mobility, Bed to Chair, Occupational psychologist, Tub/Shower     Locomotion: PT Wheelchair Mobility     Additional Impairments: OT None  SLP None      TR      Anticipated Outcomes Item Anticipated Outcome  Self Feeding    Swallowing      Basic self-care  Marketing executive Transfers Supervision  Bowel/Bladder  remain continent, maintain regular pattern of voiding  Transfers  mod I  Locomotion  mod I w/c level  Communication     Cognition     Pain  less than 4 or no pain  Safety/Judgment  free of falls, skin breakdown, infection   Therapy Plan: PT Intensity: Minimum of 1-2 x/day ,45 to 90 minutes PT Frequency: 5 out of 7 days PT Duration Estimated Length of Stay: 7-10 days OT Intensity: Minimum of 1-2 x/day, 45 to 90 minutes OT Frequency: 5 out of 7 days OT Duration/Estimated Length of Stay: 10-14 days     Due to the current state of emergency, patients may not be receiving their 3-hours of Medicare-mandated therapy.   Team Interventions: Nursing Interventions Patient/Family Education, Skin  Care/Wound Management, Psychosocial Support, Bladder Management, Discharge Planning  PT interventions Ambulation/gait training, Community reintegration, DME/adaptive equipment instruction, Neuromuscular re-education, UE/LE Strength taining/ROM, Wheelchair propulsion/positioning, Warden/ranger, Discharge planning, Pain management, Therapeutic Activities, UE/LE Coordination activities, Functional mobility training, Patient/family education, Splinting/orthotics, Therapeutic Exercise  OT Interventions Warden/ranger, Community reintegration, Discharge planning, Disease mangement/prevention, Fish farm manager, Functional mobility training, Pain management, Patient/family education, Psychosocial support, Self Care/advanced ADL retraining, Skin care/wound managment, Splinting/orthotics, Therapeutic Activities, Therapeutic Exercise, UE/LE Strength taining/ROM, Wheelchair propulsion/positioning  SLP Interventions    TR Interventions    SW/CM Interventions Discharge Planning, Psychosocial Support, Patient/Family Education   Barriers to Discharge MD  Medical stability and Wound care  Nursing Decreased caregiver support    PT Decreased caregiver support    OT Decreased caregiver support    SLP      SW Decreased caregiver support Does not have 24 hr care   Team Discharge Planning: Destination: PT-Home ,OT- Home , SLP-Home Projected Follow-up: PT-Home health PT, OT-  Home health OT, SLP-None Projected Equipment Needs: PT-To be determined, OT- None recommended by OT, SLP-None recommended by SLP Equipment Details: PT- , OT-pt has all recommended equipment Patient/family involved in discharge planning: PT- Patient,  OT-Patient, SLP-Patient  MD ELOS: 8-12 days. Medical Rehab Prognosis:  Good Assessment: 63 year old male with history of COPD, CAF,TBI withseizure disorder, hep C, schizophrenia, PTSD, bipolar disorder,ETOH abuse,B-AKAwho was admitted to [facility] 06/29/18 with chest pain  due to Afib with RVR and AECOPD with fluid overload. He required intubation and was treated with decadron and antibiotics. He was found to have acute and chronic right IJ and subclavian DVT, moderate bilateral pleural effusions as well as ascites with leucocytosis (no indication of SBE per records). CTA chest,abdomen and pelvis was negative for PE and showed aortic atherosclerosis with mural thrombus throughout abdominal aorta and pelvic vasculature--no occlusion, mild dilation 1.5 cm of left common iliac artery with associated mural thrombus and occlusion of native L-SFA. He was started on IV heparin and due to difficulty with vent wean, he was transferred to [facility]. Hospital course significant for ongoing hypoxia due to PNA as well as tachycardia. 2D echo done 3/2 revealing dilated CM with diffuse LV, EF 15-20%, mild to moderate RV, moderate calcification on aortic valve--unable to exclude vegetation, degeneration of mitral valve with possible small mobile vegetation on mitral leaflet with cordal involvement. On 07/13/18, he developed fevers T- 102 with RVR and severe systolic failure. Dr. Algie Coffer consulted for input and recommended IV amiodarone and expressed concerns of endocarditis. Blood cultures 3/8/20were positive for MRSA and UCS + enterococcus raffinosus and E coli and --was started on antibiotics.He underwent tracheostomy by Dr. Ezzard Standing on 3/10 and PEG tub placed by Dr.Hassell on 3/17. He developed recurrent feverswith rise in WBC to 17,000on 3/22 and was found to have Citrobacter/MDRO Proteus HCAP as well as proteus UTIand treated with course of antibiotics. He continued to have multiple episodes of A flutter requiring IV Cardizem for conversion.Hewas extubated to ATC and eventually decannulated by 4/15. He continued to be encephalopathic with lethargy and confusion.He developed recurrent fevers with thrombocytopenia SIRS on 4/21 felt to be due incomplete  treatment of endocarditis therefore started Vancomycin and rifampin with end date of rifampin- 4/25 and end date of Vancomycin 5/17.Per discussion with PT, his mentation started clearing with resolution of confusion in the past couple of days and he is showing improvement in activity tolerance. Patient with resulting functional deficits with mobility, endurance, self-care.  Will set goals for Supervision/Mod I at wheelchair level with PT/OT.   See Team Conference Notes for weekly updates to the plan of care

## 2018-08-29 ENCOUNTER — Inpatient Hospital Stay (HOSPITAL_COMMUNITY): Payer: Self-pay | Admitting: Speech Pathology

## 2018-08-29 ENCOUNTER — Inpatient Hospital Stay (HOSPITAL_COMMUNITY): Payer: Self-pay | Admitting: Occupational Therapy

## 2018-08-29 ENCOUNTER — Inpatient Hospital Stay (HOSPITAL_COMMUNITY): Payer: Self-pay | Admitting: Physical Therapy

## 2018-08-29 DIAGNOSIS — E876 Hypokalemia: Secondary | ICD-10-CM

## 2018-08-29 DIAGNOSIS — I4891 Unspecified atrial fibrillation: Secondary | ICD-10-CM

## 2018-08-29 DIAGNOSIS — I330 Acute and subacute infective endocarditis: Secondary | ICD-10-CM

## 2018-08-29 DIAGNOSIS — I82621 Acute embolism and thrombosis of deep veins of right upper extremity: Secondary | ICD-10-CM

## 2018-08-29 LAB — COMPREHENSIVE METABOLIC PANEL
ALT: 24 U/L (ref 0–44)
AST: 22 U/L (ref 15–41)
Albumin: 2.9 g/dL — ABNORMAL LOW (ref 3.5–5.0)
Alkaline Phosphatase: 85 U/L (ref 38–126)
Anion gap: 10 (ref 5–15)
BUN: 12 mg/dL (ref 8–23)
CO2: 17 mmol/L — ABNORMAL LOW (ref 22–32)
Calcium: 8.8 mg/dL — ABNORMAL LOW (ref 8.9–10.3)
Chloride: 110 mmol/L (ref 98–111)
Creatinine, Ser: 0.82 mg/dL (ref 0.61–1.24)
GFR calc Af Amer: >60 mL/min (ref >60)
GFR calc non Af Amer: >60 mL/min (ref >60)
Glucose, Bld: 106 mg/dL — ABNORMAL HIGH (ref 70–99)
Potassium: 3.4 mmol/L — ABNORMAL LOW (ref 3.5–5.1)
Sodium: 137 mmol/L (ref 135–145)
Total Bilirubin: 0.5 mg/dL (ref 0.3–1.2)
Total Protein: 6.1 g/dL — ABNORMAL LOW (ref 6.5–8.1)

## 2018-08-29 LAB — CBC WITH DIFFERENTIAL/PLATELET
Band Neutrophils: 0 %
Basophils Absolute: 0 10*3/uL (ref 0.0–0.1)
Basophils Relative: 1 %
Blasts: 0 %
Eosinophils Absolute: 0.1 10*3/uL (ref 0.0–0.5)
Eosinophils Relative: 3 %
HCT: 42.7 % (ref 39.0–52.0)
Hemoglobin: 14.1 g/dL (ref 13.0–17.0)
Lymphocytes Relative: 48 %
Lymphs Abs: 2.3 10*3/uL (ref 0.7–4.0)
MCH: 28.3 pg (ref 26.0–34.0)
MCHC: 33 g/dL (ref 30.0–36.0)
MCV: 85.7 fL (ref 80.0–100.0)
Metamyelocytes Relative: 0 %
Monocytes Absolute: 0.2 10*3/uL (ref 0.1–1.0)
Monocytes Relative: 5 %
Myelocytes: 0 %
Neutro Abs: 1.9 10*3/uL (ref 1.7–7.7)
Neutrophils Relative %: 43 %
Other: 0 %
Platelets: 155 10*3/uL (ref 150–400)
Promyelocytes Relative: 0 %
RBC: 4.98 MIL/uL (ref 4.22–5.81)
RDW: 16.7 % — ABNORMAL HIGH (ref 11.5–15.5)
WBC: 4.5 10*3/uL (ref 4.0–10.5)
nRBC: 0 % (ref 0.0–0.2)
nRBC: 0 /100 WBC

## 2018-08-29 LAB — GLUCOSE, CAPILLARY
Glucose-Capillary: 99 mg/dL (ref 70–99)
Glucose-Capillary: 140 mg/dL — ABNORMAL HIGH (ref 70–99)
Glucose-Capillary: 84 mg/dL (ref 70–99)
Glucose-Capillary: 135 mg/dL — ABNORMAL HIGH (ref 70–99)

## 2018-08-29 LAB — PROTIME-INR
INR: 2.7 — ABNORMAL HIGH (ref 0.8–1.2)
Prothrombin Time: 28 seconds — ABNORMAL HIGH (ref 11.4–15.2)

## 2018-08-29 MED ORDER — TOPIRAMATE 100 MG PO TABS
200.0000 mg | ORAL_TABLET | Freq: Three times a day (TID) | ORAL | Status: DC
  Administered 2018-08-29 – 2018-09-02 (×12): 200 mg via ORAL
  Filled 2018-08-29 (×14): qty 2

## 2018-08-29 MED ORDER — SODIUM CHLORIDE 0.9% FLUSH
10.0000 mL | INTRAVENOUS | Status: DC | PRN
  Administered 2018-09-05: 10 mL
  Filled 2018-08-29: qty 40

## 2018-08-29 MED ORDER — WARFARIN SODIUM 1 MG PO TABS
1.0000 mg | ORAL_TABLET | Freq: Once | ORAL | Status: AC
  Administered 2018-08-29: 1 mg via ORAL
  Filled 2018-08-29: qty 1
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
## 2018-08-29 NOTE — Care Management Note (Signed)
Inpatient Rehabilitation Center Individual Statement of Services  Patient Name:  [name] [name]  Date:  08/29/2018  Welcome to the Inpatient Rehabilitation Center.  Our goal is to provide you with an individualized program based on your diagnosis and situation, designed to meet your specific needs.  With this comprehensive rehabilitation program, you will be expected to participate in at least 3 hours of rehabilitation therapies Monday-Friday, with modified therapy programming on the weekends.  Your rehabilitation program will include the following services:  Physical Therapy (PT), Occupational Therapy (OT), Speech Therapy (ST), 24 hour per day rehabilitation nursing, Neuropsychology, Case Management (Social Worker), Rehabilitation Medicine, Nutrition Services and Pharmacy Services  Weekly team conferences will be held on Wednesday to discuss your progress.  Your Social Worker will talk with you frequently to get your input and to update you on team discussions.  Team conferences with you and your family in attendance may also be held.  Expected length of stay: 10-14 days  Overall anticipated outcome: independent with device-supervision with bathing and toileting  Depending on your progress and recovery, your program may change. Your Social Worker will coordinate services and will keep you informed of any changes. Your Social Worker's name and contact numbers are listed  below.  The following services may also be recommended but are not provided by the Inpatient Rehabilitation Center:    Home Health Rehabiltiation Services  Outpatient Rehabilitation Services    Arrangements will be made to provide these services after discharge if needed.  Arrangements include referral to agencies that provide these services.  Your insurance has been verified to be:  Medicare & Medicaid Your primary doctor is:  Arva Chafe  Pertinent information will be shared with your doctor and your insurance  company.  Social Worker:  Dossie Der, SW [phone] or ([phone]  Information discussed with and copy given to patient by: Lucy Chris, 08/29/2018, 9:42 AM

## 2018-08-29 NOTE — Progress Notes (Signed)
ANTICOAGULATION CONSULT NOTE  Pharmacy Consult for warfarin Indication: history afib/RUE DVT  Allergies  Allergen Reactions  . Penicillins Anaphylaxis    Tolerated cefepime and ceftriaxone  Has patient had a PCN reaction causing immediate rash, facial/tongue/throat swelling, SOB or lightheadedness with hypotension: Yes Has patient had a PCN reaction causing severe rash involving mucus membranes or skin necrosis: No Has patient had a PCN reaction that required hospitalization No Has patient had a PCN reaction occurring within the last 10 years: No If all of the above answers are "NO", then may proceed with Cephalosporin use.  Marland Kitchen Penicillins Swelling    SWELLING REACTION UNSPECIFIED   . Prednisone Anaphylaxis  . Prednisone Swelling    THROAT  . Acetaminophen Other (See Comments)    Affects liver  . Tramadol Nausea Only and Other (See Comments)     Nausea and eyes were bloodshot red  . Latex Itching    Pt. Stated  Latex was indicated during hypersensitivity panel  . Tylenol [Acetaminophen]     Due to liver function per patient  . Latex Rash  . Other Rash    Plastic  . Trazodone Other (See Comments)    Made eyes red     Vital Signs: Temp: 97.8 F (36.6 C) (04/24 0631) Temp Source: Oral (04/24 0631) BP: 97/60 (04/24 0631) Pulse Rate: 60 (04/24 0922)  Labs: Recent Labs    08/26/18 1314 08/27/18 0505 08/28/18 0446 08/29/18 0545  HGB 14.2  --   --  14.1  HCT 42.1  --   --  42.7  PLT 177  --   --  155  LABPROT  --  15.3* 19.1* 28.0*  INR  --  1.2 1.6* 2.7*  CREATININE 0.65  --   --  0.82    Estimated Creatinine Clearance: 60.8 mL/min (by C-G formula based on SCr of 0.82 mg/dL).  Assessment: 63 yo male transferred to Rehab from Arts development officer. He is on warfarin for history of afib/ RUE DVT. Pharmacy was consulted to dose warfarin. He is noted on rifampin which will typically increase dosing requirements.  INR 1.6 >> 2.7 which is surprising with  Rifampin on board.  No bleeding noted, CBC nml.  PTA regimen per SNF: 7.5mg /d (increased on 4/20 from 5mg /day)  Goal of Therapy:  INR 2-3 Monitor platelets by anticoagulation protocol: Yes   Plan:  -Warfarin 1 mg PO tonight -Daily PT/INR -Monitor for s/sx of bleeding   Loura Back, PharmD, BCPS Clinical Pharmacist Clinical phone for 08/29/2018 until 3p is [phone] 08/29/2018 1:04 PM  **Pharmacist phone directory can now be found on [email or web address] listed under [facility]**

## 2018-08-29 NOTE — Progress Notes (Signed)
Inpatient Rehabilitation  Patient information reviewed and entered into eRehab system by Persais Ethridge M. Graclynn Vanantwerp, M.A., CCC/SLP, PPS Coordinator.  Information including medical coding, functional ability and quality indicators will be reviewed and updated through discharge.    
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
## 2018-08-30 ENCOUNTER — Inpatient Hospital Stay: Payer: Self-pay

## 2018-08-30 ENCOUNTER — Inpatient Hospital Stay (HOSPITAL_COMMUNITY): Payer: Self-pay

## 2018-08-30 ENCOUNTER — Inpatient Hospital Stay (HOSPITAL_COMMUNITY): Payer: Self-pay | Admitting: Occupational Therapy

## 2018-08-30 LAB — GLUCOSE, CAPILLARY
Glucose-Capillary: 89 mg/dL (ref 70–99)
Glucose-Capillary: 120 mg/dL — ABNORMAL HIGH (ref 70–99)

## 2018-08-30 LAB — PROTIME-INR
INR: 2.6 — ABNORMAL HIGH (ref 0.8–1.2)
Prothrombin Time: 27.5 seconds — ABNORMAL HIGH (ref 11.4–15.2)

## 2018-08-30 MED ORDER — WARFARIN SODIUM 5 MG PO TABS
5.0000 mg | ORAL_TABLET | Freq: Once | ORAL | Status: AC
  Administered 2018-08-30: 5 mg via ORAL
  Filled 2018-08-30: qty 1

## 2018-08-30 NOTE — Progress Notes (Signed)
Occupational Therapy Session Note  Patient Details  Name: Nim Gelwicks MRN: [id number] Date of Birth: [birth year]  Today's Date: 08/30/2018 OT Individual Time: 4888-9169 OT Individual Time Calculation (min): 60 min   Skilled Therapeutic Interventions/Progress Updates:Mr. Schiffler was received supine in bed working with nursing for various areas of care.   Patient was obviously intelligent and knew much about many topics and talked incessantly.   He did require redirection throughout the session.   Due to issues with IV leaking and his distracting his session with require particularities in order to participate the focus of this session was bed mobility and lateral leans and abdominal/core strength to don/doff pants and underwear.   As well, it was necessary to educate him on how to the bed controls because when using the urinal he was not aware initially that his urinal was spilling back onto his after use (due to placement of urinal higher than his penis).  He required many opportunties and verbal cues and min A to roll laterally far enough to pull up pants and underwear and to change the wet pad underneath himself. The lateral rolls and lateral leans, along with abdominal work will help patient to increase self care for pericarea and pulling up pants.  He stated that he does not think his prothesis will fit (stated he has lost weight and muscle girth in his quadricep areas.   He stated he will call [employer] to come remeasure and check out the fit of his prosthesis.  Call bell and phone and bed alarm were left in place and engaged at the end of the session.  Continue OT Plan of care.  Therapy Documentation Precautions:  Precautions Precautions: Fall Precaution Comments: bilateral AKA Restrictions Weight Bearing Restrictions: Yes RLE Weight Bearing: Non weight bearing LLE Weight Bearing: Non weight bearing  Pain:denied    Therapy/Group: Individual Therapy  Bud Face  [facility] 08/30/2018, 2:34 PM

## 2018-08-30 NOTE — Progress Notes (Signed)
At bedside to obtain PICC consent.  All risks and benefits clearly explained to pt.  Pt states "has had many PICCs and understands the procedure." He also states he "has read many law books and knows that "the nurse, Jani Files ) talked too fast and that's not legal"  This RN asked what questions or clarification he needed.  Stated "none, I know them and understand them all, but there are too many things that can go wrong.  My girlfriend just died and my heart just got straight.  I don't want anything to mess it up.  I've had lots of these PICC lines all over my body.  I know that they have a hard time going in but are easy to come out."  Notified of the alternatives.  Stated he would rather have a PIV, even tho he is afraid of needles and this would only last temporarily with the Vancomycin, and require potentially more needle sticks.  States he "will only have a PICC inserted if he is completely knocked out for it".  IV team notified for PIV needed, RN also notified of conversation.

## 2018-08-30 NOTE — Progress Notes (Signed)
Physical Therapy Session Note  Patient Details  Name: [name] [name] MRN: [id number] Date of Birth: 09/15/1955  Today's Date: 08/30/2018 PT Individual Time: 1425-1535 PT Individual Time Calculation (min): 70 min   Short Term Goals: Week 1:  PT Short Term Goal 1 (Week 1): = LTG  Skilled Therapeutic Interventions/Progress Updates:     Patient in bed discussing PICC line placement with staff upon PT arrival. Missed 5 minutes of skilled PT for patient education on a medical procedure. Patient declined procedure today and was agreeable to PT session.  Therapeutic Activity: Bed Mobility: Patient performed supine to/from sit with supervision for safety. Provided verbal cues for lying in the middle of the bed versus the edge to reduce fall risk. Discussed lying flat in supine to reduce risk of hip flexion contractures, unable to ly prone at this time due to PEG tube placement.  Transfers: Patient performed P/A transfer with a 180 degree pivot to/from the w/c to the bed with close supervision-CGA for safety. Provided verbal cues for having w/c flush with the bed to reduce gap during transfers. Performed a car transfer using a slide board performing a P/A transfer to from the w/c with close supervision-CGA for safety and balance. Patient reports that this is the technique he uses to get in and out of his brothers Zenaida Niece. Transfer was performed at a level height to the w/c, will continue to progress to increased height of large SUV.   Wheelchair Mobility:  Patient propelled wheelchair 50 feet with several tight turns with decreased speed due to tendency to talk while propelling the w/c with supervision for safety. Provided verbal cues for avoiding obstacles.  Therapeutic Exercise: Patient performed the following exercises with verbal and tactile cues for proper technique. -Went up and down a ramp forward x1 with close supervision and up a ramp backwards and down forwards x3 for UE strengthening.   -Performed w/c pushups with limited clearance of buttock from w/c cushion x5. Educated on pressure relief in sitting every 15-20 minutes and use of exercise for UE strengthening.    Patient in bed at end of session with breaks locked, bed alarm set, and all needs within reach.    Therapy Documentation Precautions:  Precautions Precautions: Fall Precaution Comments: bilateral AKA Restrictions Weight Bearing Restrictions: Yes RLE Weight Bearing: Non weight bearing LLE Weight Bearing: Non weight bearing General: PT Amount of Missed Time (min): 5 Minutes PT Missed Treatment Reason: Unavailable (Comment)(PICC line placement ) Pain:  Patient denied pain throughout session.     Therapy/Group: Individual Therapy  Aranza Geddes L Michah Minton PT, DPT  08/30/2018, 3:46 PM

## 2018-08-30 NOTE — Progress Notes (Signed)
Occupational Therapy Session Note  Patient Details  Name: [name] [name] MRN: [id number] Date of Birth: 29-Dec-1955  Today's Date: 08/30/2018 OT Individual Time: 1410-3013 OT Individual Time Calculation (min): 48 min    Skilled Therapeutic Interventions/Progress Updates: Upon approach for OT, patient asked, "Will you see if I am wet.   I think I used the urinal uphill again and it ran out and wet my pants."    Patient's short and pad wet.    He was able to complete lateral leans and rolls to doff underwear and shorts (S) and don cut off blue scrub pants.  He was able to complete lateral rolls for this clinician to place a dry bed pad.  He continued working on abdominal strengthening as he stated upon d/c he will probably dress either on the toilet or on the bed.    His abdominal fordward flexion strength exhibited very weak (poor) today.   He continued working on upper body and abdominal strength to increase self care and transfer status.     Patient stated he has a hospital bed at home.  He stated concern that his prosthesis is too large for his thighs now and that he will call [employer] to come out and help him fit the prosthesis.  He was reeducated on how to use bed controls so that when he uses the urinal he does wet himself.   As well, this cliniican suggested he call for help if he is not sure how to adjust the bed before using the urinal.  He was left at his request supine with head of bed elevated, to rest prior to his upcoming therapy session.   Call bell was in place as well.     Therapy Documentation Precautions:  Precautions Precautions: Fall Precaution Comments: bilateral AKA Restrictions Weight Bearing Restrictions: Yes RLE Weight Bearing: Non weight bearing LLE Weight Bearing: Non weight bearing  Pain:denied    Therapy/Group: Individual Therapy  Bud Face [facility] 08/30/2018, 3:02 PM

## 2018-08-30 NOTE — Plan of Care (Signed)
  Problem: RH BOWEL ELIMINATION Goal: RH STG MANAGE BOWEL WITH ASSISTANCE Description STG Manage Bowel with Assistance. Min  Outcome: Progressing   Problem: RH BLADDER ELIMINATION Goal: RH STG MANAGE BLADDER WITH ASSISTANCE Description STG Manage Bladder With Assistance. Min  Outcome: Progressing   Problem: RH SKIN INTEGRITY Goal: RH STG SKIN FREE OF INFECTION/BREAKDOWN Description Free of further breakdown and infection with mod assist  Outcome: Progressing Goal: RH STG MAINTAIN SKIN INTEGRITY WITH ASSISTANCE Description STG Maintain Skin Integrity With mod Assistance.  Outcome: Progressing Goal: RH STG ABLE TO PERFORM INCISION/WOUND CARE W/ASSISTANCE Description STG Able To Perform Incision/Wound Care With Assistance. mod  Outcome: Progressing   Problem: RH SAFETY Goal: RH STG ADHERE TO SAFETY PRECAUTIONS W/ASSISTANCE/DEVICE Description STG Adhere to Safety Precautions With Assistance/Device. min  Outcome: Progressing   Problem: RH KNOWLEDGE DEFICIT GENERAL Goal: RH STG INCREASE KNOWLEDGE OF SELF CARE AFTER HOSPITALIZATION Description Patient will be able to describe self care at home with cues/handouts  Outcome: Progressing   Problem: Consults Goal: RH GENERAL PATIENT EDUCATION Description See Patient Education module for education specifics. Outcome: Progressing

## 2018-08-30 NOTE — Progress Notes (Signed)
[place] PHYSICAL MEDICINE & REHABILITATION PROGRESS NOTE  Subjective/Complaints: No issues overnite, pt slept well  ROS: Denies CP, shortness of breath, nausea, vomiting, diarrhea.  Objective: Vital Signs: Blood pressure 109/72, pulse 60, temperature 97.6 F (36.4 C), temperature source Oral, resp. rate 20, height 4' (1.219 m), weight 81.4 kg, SpO2 99 %. Dg Chest Port 1v Same Day  Result Date: 08/28/2018 CLINICAL DATA:  PICC line placement. EXAM: PORTABLE CHEST 1 VIEW COMPARISON:  Chest x-ray dated August 11, 2018. FINDINGS: Interval removal of the tracheostomy tube. No PICC line identified. Partially visualized gastrostomy tube. The heart size and mediastinal contours are within normal limits. Normal pulmonary vascularity. No focal consolidation, pleural effusion, or pneumothorax. No acute osseous abnormality. IMPRESSION: 1. No PICC line identified. 2. No active disease. Electronically Signed   By: Obie Dredge M.D.   On: 08/28/2018 16:15   Recent Labs    08/29/18 0545  WBC 4.5  HGB 14.1  HCT 42.7  PLT 155   Recent Labs    08/29/18 0545  NA 137  K 3.4*  CL 110  CO2 17*  GLUCOSE 106*  BUN 12  CREATININE 0.82  CALCIUM 8.8*    Physical Exam: BP 109/72 (BP Location: Left Arm)   Pulse 60   Temp 97.6 F (36.4 C) (Oral)   Resp 20   Ht 4' (1.219 m)   Wt 81.4 kg   SpO2 99%   BMI 54.76 kg/m  Constitutional: No distress . Vital signs reviewed. HENT: Normocephalic.  Atraumatic. Eyes: EOMI. No discharge. Cardiovascular: No JVD. Respiratory: Normal effort. GI: Non-distended. Musc: Bilateral AKA Neurologic: Alert and oriented Motor: Bilateral upper extremities 4+/5 proximal and distal Bilateral lower extremities: Hip flexion: 4/5 Hypersensitivity right stump Skin: Warm and dry.  Intact. Psych: Normal mood.  Normal behavior.  Assessment/Plan: 1. Functional deficits secondary to debility with history of bilateral AKA which require 3+ hours per day of  interdisciplinary therapy in a comprehensive inpatient rehab setting.  Physiatrist is providing close team supervision and 24 hour management of active medical problems listed below.  Physiatrist and rehab team continue to assess barriers to discharge/monitor patient progress toward functional and medical goals  Care Tool:  Bathing    Body parts bathed by patient: Right arm, Left arm, Chest, Abdomen, Front perineal area, Right upper leg, Left upper leg, Face   Body parts bathed by helper: Buttocks Body parts n/a: Right lower leg, Left lower leg(B AKA)   Bathing assist Assist Level: Minimal Assistance - Patient > 75%     Upper Body Dressing/Undressing Upper body dressing   What is the patient wearing?: Pull over shirt    Upper body assist Assist Level: Supervision/Verbal cueing    Lower Body Dressing/Undressing Lower body dressing      What is the patient wearing?: Underwear/pull up, Pants     Lower body assist Assist for lower body dressing: Minimal Assistance - Patient > 75%     Toileting Toileting    Toileting assist Assist for toileting: Minimal Assistance - Patient > 75%     Transfers Chair/bed transfer  Transfers assist     Chair/bed transfer assist level: Minimal Assistance - Patient > 75%     Locomotion Ambulation   Ambulation assist   Ambulation activity did not occur: N/A(bilat AKA)          Walk 10 feet activity   Assist           Walk 50 feet activity   Assist  Walk 150 feet activity   Assist           Walk 10 feet on uneven surface  activity   Assist           Wheelchair     Assist Will patient use wheelchair at discharge?: Yes Type of Wheelchair: Manual    Wheelchair assist level: Supervision/Verbal cueing Max wheelchair distance: 150    Wheelchair 50 feet with 2 turns activity    Assist        Assist Level: Supervision/Verbal cueing   Wheelchair 150 feet activity      Assist     Assist Level: Supervision/Verbal cueing      Medical Problem List and Plan: 1. Debility secondary to respiratory failure due to pneumonia and exacerbation of CHF  CIR PT, OT   2. Antithrombotics: -RUE DVT/anticoagulation:Pharmaceutical:Coumadin  INR therapeutic on 4/24, however significantly increased from 4/23, Lab pnd  CBC within normal limits on 4/24 -antiplatelet therapy: N/A 3. Pain Management:  4. Mood:LCSW to follow for evaluation and support. -antipsychotic agents: Seroquel and klonopin. 5. Neuropsych: This patient is capable of making decisions on his own behalf. 6. Skin/Wound Care:routine pressure relief measures. 7. Fluids/Electrolytes/Nutrition:Tolerating regular diet. Monitor I/O.  8. Sepsis/endocarditis: To continue Vanc through 5/17 and Rifampin through 4/25 4/25.   Vanco within normal limits on 4/22 9. COPD: Respiratory status stable on prn nebs.  10 A fib with RVR: Monitor HR bid. Continue amiodarone, digoxin, diltiazem and metoprolol. Medications adjusted due to bradycardia.   ECG on 4/23 reviewed, sinus bradycardia 11. Neurogenic bladder?: continue flomax.   1 PVR without retention, continue to follow PVRs.  12. 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    States he was using prosthesis for ambulation until he started developing pressure sores  Will monitor and consider prosthetists evaluation for adjustments if necessary 13. Seizure disorder/chronic HA: Continue topiramate and Elavil.  14. Bipolar disorder: Stable on Seroquel and Klonpin. 15.  Hypokalemia  Potassium 3.4 on 4/24  Supplemented x1 day  Labs ordered for 4/27   LOS: 2 days A FACE TO FACE EVALUATION WAS PERFORMED  Erick Colace 08/30/2018, 6:41 AM

## 2018-08-30 NOTE — Progress Notes (Signed)
ANTICOAGULATION CONSULT NOTE  Pharmacy Consult for warfarin Indication: history afib/RUE DVT  Allergies  Allergen Reactions  . Penicillins Anaphylaxis    Tolerated cefepime and ceftriaxone  Has patient had a PCN reaction causing immediate rash, facial/tongue/throat swelling, SOB or lightheadedness with hypotension: Yes Has patient had a PCN reaction causing severe rash involving mucus membranes or skin necrosis: No Has patient had a PCN reaction that required hospitalization No Has patient had a PCN reaction occurring within the last 10 years: No If all of the above answers are "NO", then may proceed with Cephalosporin use.  Marland Kitchen Penicillins Swelling    SWELLING REACTION UNSPECIFIED   . Prednisone Anaphylaxis  . Prednisone Swelling    THROAT  . Acetaminophen Other (See Comments)    Affects liver  . Tramadol Nausea Only and Other (See Comments)     Nausea and eyes were bloodshot red  . Latex Itching    Pt. Stated  Latex was indicated during hypersensitivity panel  . Tylenol [Acetaminophen]     Due to liver function per patient  . Latex Rash  . Other Rash    Plastic  . Trazodone Other (See Comments)    Made eyes red     Vital Signs: Temp: 97.6 F (36.4 C) (04/25 0525) Temp Source: Oral (04/25 0525) BP: 109/72 (04/25 0525) Pulse Rate: 60 (04/25 0525)  Labs: Recent Labs    08/28/18 0446 08/29/18 0545 08/30/18 0536  HGB  --  14.1  --   HCT  --  42.7  --   PLT  --  155  --   LABPROT 19.1* 28.0* 27.5*  INR 1.6* 2.7* 2.6*  CREATININE  --  0.82  --     Estimated Creatinine Clearance: 60.8 mL/min (by C-G formula based on SCr of 0.82 mg/dL).  Assessment: 63 yo male transferred to Rehab from [employer]. He is on warfarin for history of afib/ RUE DVT. Pharmacy was consulted to dose warfarin. He is noted on rifampin which will typically increase dosing requirements.  INR increased on 08/29/18 from  1.6 up to  2.7 which is surprising with  Rifampin on board.  Today the  INR is 2.6, therapeutic , seems stable after such a significant increase yesterday. He received 7.5mg  daily at [facility], dose increased on 08/25/18 from 5 mg daily.  INR trend last several days 2.0>1.3>1.2>1.6 > 2.7>2.6 -potential drug-drug interactions with warfarin are  Rifampin can decrease warf effect , and amiodarone can increase warfarin effect. Patient is eating well per review in Epic charting record. No bleeding noted, CBC nml.  PTA regimen per SNF: 7.5mg /d (increased on 4/20 from 5mg /day)  Goal of Therapy:  INR 2-3 Monitor platelets by anticoagulation protocol: Yes   Plan:  -Warfarin 5 mg PO tonight -Daily PT/INR -Monitor for s/sx of bleeding  Noah Delaine, RPh Clinical Pharmacist Pager: [phone] Please check AMION for all [facility] Pharmacy phone numbers After 10:00 PM, call Main Pharmacy [phone] 08/30/2018 9:20 AM  **Pharmacist phone directory can now be found on [email or web address] listed under [facility]**

## 2018-08-31 LAB — PROTIME-INR
INR: 2.7 — ABNORMAL HIGH (ref 0.8–1.2)
Prothrombin Time: 28 seconds — ABNORMAL HIGH (ref 11.4–15.2)

## 2018-08-31 MED ORDER — CLOTRIMAZOLE 1 % EX CREA
TOPICAL_CREAM | Freq: Two times a day (BID) | CUTANEOUS | Status: DC
  Administered 2018-08-31 – 2018-09-01 (×3): via TOPICAL
  Administered 2018-09-01: 1 via TOPICAL
  Administered 2018-09-02 – 2018-09-05 (×7): via TOPICAL
  Filled 2018-08-31 (×2): qty 15

## 2018-08-31 MED ORDER — WARFARIN SODIUM 5 MG PO TABS
5.0000 mg | ORAL_TABLET | Freq: Once | ORAL | Status: AC
  Administered 2018-08-31: 5 mg via ORAL
  Filled 2018-08-31: qty 1

## 2018-08-31 MED ORDER — METOPROLOL TARTRATE 25 MG PO TABS
25.0000 mg | ORAL_TABLET | Freq: Three times a day (TID) | ORAL | Status: DC
  Administered 2018-08-31 – 2018-09-02 (×8): 25 mg via ORAL
  Filled 2018-08-31 (×9): qty 1

## 2018-08-31 NOTE — Progress Notes (Addendum)
[place] PHYSICAL MEDICINE & REHABILITATION PROGRESS NOTE  Subjective/Complaints: No dizziness with therapy, has intermittent frontal and occipital HA,  Also c/o rash on hands, states he was prescribed antifungal cream in the past ROS: Denies CP, shortness of breath, nausea, vomiting, diarrhea.  Objective: Vital Signs: Blood pressure 96/78, pulse (!) 50, temperature 97.7 F (36.5 C), temperature source Oral, resp. rate 18, height 4' (1.219 m), weight 81.4 kg, SpO2 98 %. [place] Ekg Site Rite  Result Date: 08/30/2018 If [facility] image not attached, placement could not be confirmed due to current cardiac rhythm.  Recent Labs    08/29/18 0545  WBC 4.5  HGB 14.1  HCT 42.7  PLT 155   Recent Labs    08/29/18 0545  NA 137  K 3.4*  CL 110  CO2 17*  GLUCOSE 106*  BUN 12  CREATININE 0.82  CALCIUM 8.8*    Physical Exam: BP 96/78 (BP Location: Left Arm)   Pulse (!) 50   Temp 97.7 F (36.5 C) (Oral)   Resp 18   Ht 4' (1.219 m)   Wt 81.4 kg   SpO2 98%   BMI 54.76 kg/m  Constitutional: No distress . Vital signs reviewed. HENT: Normocephalic.  Atraumatic. Eyes: EOMI. No discharge. Cardiovascular: No JVD. Respiratory: Normal effort. GI: Non-distended. Musc: Bilateral AKA Neurologic: Alert and oriented Motor: Bilateral upper extremities 4+/5 proximal and distal Bilateral lower extremities: Hip flexion: 4/5 Hypersensitivity right stump Skin: Scaly erytematous rash on fingers and wrists with fungal changes on finger nails on Right  Psych: Normal mood.  Normal behavior.  Assessment/Plan: 1. Functional deficits secondary to debility with history of bilateral AKA which require 3+ hours per day of interdisciplinary therapy in a comprehensive inpatient rehab setting.  Physiatrist is providing close team supervision and 24 hour management of active medical problems listed below.  Physiatrist and rehab team continue to assess barriers to discharge/monitor patient progress toward  functional and medical goals  Care Tool:  Bathing    Body parts bathed by patient: Right arm, Left arm, Chest, Abdomen, Front perineal area, Right upper leg, Left upper leg   Body parts bathed by helper: Buttocks Body parts n/a: Right lower leg, Left lower leg(B AKA)   Bathing assist Assist Level: Minimal Assistance - Patient > 75%     Upper Body Dressing/Undressing Upper body dressing   What is the patient wearing?: Pull over shirt    Upper body assist Assist Level: Supervision/Verbal cueing    Lower Body Dressing/Undressing Lower body dressing      What is the patient wearing?: Pants, Underwear/pull up     Lower body assist Assist for lower body dressing: Minimal Assistance - Patient > 75%(extra time to complete lateral leans and lateral rolls to pull  up underwear and shorts)     Toileting Toileting    Toileting assist Assist for toileting: Minimal Assistance - Patient > 75%     Transfers Chair/bed transfer  Transfers assist     Chair/bed transfer assist level: Supervision/Verbal cueing     Locomotion Ambulation   Ambulation assist   Ambulation activity did not occur: N/A          Walk 10 feet activity   Assist           Walk 50 feet activity   Assist           Walk 150 feet activity   Assist           Walk 10 feet on  uneven surface  activity   Assist           Wheelchair     Assist Will patient use wheelchair at discharge?: Yes Type of Wheelchair: Manual    Wheelchair assist level: Supervision/Verbal cueing Max wheelchair distance: 54'    Wheelchair 50 feet with 2 turns activity    Assist        Assist Level: Supervision/Verbal cueing   Wheelchair 150 feet activity     Assist     Assist Level: Supervision/Verbal cueing      Medical Problem List and Plan: 1. Debility secondary to respiratory failure due to pneumonia and exacerbation of CHF  CIR PT, OT   2. Antithrombotics: -RUE  DVT/anticoagulation:Pharmaceutical:Coumadin  INR therapeutic on 4/24, however significantly increased from 4/23, Lab pnd  CBC within normal limits on 4/24 -antiplatelet therapy: N/A 3. Pain Management:  4. Mood:LCSW to follow for evaluation and support. -antipsychotic agents: Seroquel and klonopin. 5. Neuropsych: This patient is capable of making decisions on his own behalf. 6. Skin/Wound Care:routine pressure relief measures.erythematous rash pt also states intermittent papules, given nailbed changes and prior rx of fungal cream will trial clotrimazole7. Fluids/Electrolytes/Nutrition:Tolerating regular diet. Monitor I/O.  8. Sepsis/endocarditis: To continue Vanc through 5/17 and Rifampin through 4/25. D/Ced on 4/26  Vanco within normal limits on 4/22 9. COPD: Respiratory status stable on prn nebs.  10 A fib with RVR: Monitor HR bid. Continue amiodarone, digoxin, diltiazem and metoprolol. Medications adjusted due to bradycardia.    Vitals:   08/30/18 2128 08/31/18 0551  BP: 107/68 96/78  Pulse: (!) 56 (!) 50  Resp:  18  Temp:  97.7 F (36.5 C)  SpO2:  98%  Still brady with soft BP will reduce metoprolol to 25mg  11. Neurogenic bladder?: continue flomax.   1 PVR without retention, continue to follow PVRs.  12. 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    States he was using prosthesis for ambulation until he started developing pressure sores  Will monitor and consider prosthetists evaluation for adjustments if necessary 13. Seizure disorder/chronic HA: Continue topiramate and Elavil.  14. Bipolar disorder: Stable on Seroquel and Klonpin. 15.  Hypokalemia  Potassium 3.4 on 4/24  Supplemented x1 day  Labs ordered for 4/27   LOS: 3 days A FACE TO FACE EVALUATION WAS PERFORMED  Erick Colace 08/31/2018, 5:56 AM

## 2018-08-31 NOTE — Progress Notes (Signed)
ANTICOAGULATION CONSULT NOTE  Pharmacy Consult for warfarin Indication: history afib/RUE DVT  Allergies  Allergen Reactions  . Penicillins Anaphylaxis    Tolerated cefepime and ceftriaxone  Has patient had a PCN reaction causing immediate rash, facial/tongue/throat swelling, SOB or lightheadedness with hypotension: Yes Has patient had a PCN reaction causing severe rash involving mucus membranes or skin necrosis: No Has patient had a PCN reaction that required hospitalization No Has patient had a PCN reaction occurring within the last 10 years: No If all of the above answers are "NO", then may proceed with Cephalosporin use.  Marland Kitchen Penicillins Swelling    SWELLING REACTION UNSPECIFIED   . Prednisone Anaphylaxis  . Prednisone Swelling    THROAT  . Acetaminophen Other (See Comments)    Affects liver  . Tramadol Nausea Only and Other (See Comments)     Nausea and eyes were bloodshot red  . Latex Itching    Pt. Stated  Latex was indicated during hypersensitivity panel  . Tylenol [Acetaminophen]     Due to liver function per patient  . Latex Rash  . Other Rash    Plastic  . Trazodone Other (See Comments)    Made eyes red     Vital Signs: Temp: 97.7 F (36.5 C) (04/26 0551) Temp Source: Oral (04/26 0551) BP: 96/78 (04/26 0551) Pulse Rate: 50 (04/26 0551)  Labs: Recent Labs    08/29/18 0545 08/30/18 0536 08/31/18 0539  HGB 14.1  --   --   HCT 42.7  --   --   PLT 155  --   --   LABPROT 28.0* 27.5* 28.0*  INR 2.7* 2.6* 2.7*  CREATININE 0.82  --   --     Estimated Creatinine Clearance: 60.8 mL/min (by C-G formula based on SCr of 0.82 mg/dL).  Assessment: 63 yo male transferred to Rehab from [employer] development [employer]. He is on warfarin for history of afib/ RUE DVT. Pharmacy was consulted to dose warfarin.  Today the INR is 2.7, therapeutic , seems stable after such a significant increase on 4/23-4/24 of 1.6 to 2.7.  Prior to transfer to CIR he received 7.5mg  daily at [facility], dose increased on 08/25/18 from 5 mg daily.  INR trend last several days 2.0>1.3>1.2>1.6 > 2.7>2.6>2.7 Rifampin was discontinued today, thus we may see lower warfarin dosage requirement. Although also has been on amiodarone which can increase warfarin effect. Patient is eating well per review in Epic charting record. No bleeding noted. CBC nml on 4/24.Marland Kitchen  PTA regimen per SNF: 7.5mg /d (increased on 4/20 from 5mg /day)  Goal of Therapy:  INR 2-3 Monitor platelets by anticoagulation protocol: Yes   Plan:  -Warfarin 5 mg po today -Daily PT/INR -Monitor for s/sx of bleeding  [name] [name], RPh Clinical Pharmacist Pager: [phone] Please check AMION for all [facility] Pharmacy phone numbers After 10:00 PM, call Main Pharmacy [phone] 08/31/2018 9:20 AM  **Pharmacist phone directory can now be found on [email or web address] listed under [facility]**

## 2018-08-31 NOTE — Plan of Care (Signed)
  Problem: RH BOWEL ELIMINATION Goal: RH STG MANAGE BOWEL WITH ASSISTANCE Description STG Manage Bowel with Assistance. Min  Outcome: Progressing   Problem: RH BLADDER ELIMINATION Goal: RH STG MANAGE BLADDER WITH ASSISTANCE Description STG Manage Bladder With Assistance. Min  Outcome: Progressing   Problem: RH SKIN INTEGRITY Goal: RH STG SKIN FREE OF INFECTION/BREAKDOWN Description Free of further breakdown and infection with mod assist  Outcome: Progressing Goal: RH STG MAINTAIN SKIN INTEGRITY WITH ASSISTANCE Description STG Maintain Skin Integrity With mod Assistance.  Outcome: Progressing Goal: RH STG ABLE TO PERFORM INCISION/WOUND CARE W/ASSISTANCE Description STG Able To Perform Incision/Wound Care With Assistance. mod  Outcome: Progressing   Problem: RH SAFETY Goal: RH STG ADHERE TO SAFETY PRECAUTIONS W/ASSISTANCE/DEVICE Description STG Adhere to Safety Precautions With Assistance/Device. min  Outcome: Progressing   Problem: RH KNOWLEDGE DEFICIT GENERAL Goal: RH STG INCREASE KNOWLEDGE OF SELF CARE AFTER HOSPITALIZATION Description Patient will be able to describe self care at home with cues/handouts  Outcome: Progressing   Problem: Consults Goal: RH GENERAL PATIENT EDUCATION Description See Patient Education module for education specifics. Outcome: Progressing   

## 2018-09-01 ENCOUNTER — Inpatient Hospital Stay (HOSPITAL_COMMUNITY): Payer: Self-pay | Admitting: Occupational Therapy

## 2018-09-01 ENCOUNTER — Inpatient Hospital Stay (HOSPITAL_COMMUNITY): Payer: Self-pay | Admitting: Physical Therapy

## 2018-09-01 ENCOUNTER — Encounter (HOSPITAL_COMMUNITY): Payer: Self-pay | Admitting: Psychology

## 2018-09-01 DIAGNOSIS — F4312 Post-traumatic stress disorder, chronic: Secondary | ICD-10-CM

## 2018-09-01 DIAGNOSIS — R7303 Prediabetes: Secondary | ICD-10-CM

## 2018-09-01 DIAGNOSIS — N319 Neuromuscular dysfunction of bladder, unspecified: Secondary | ICD-10-CM

## 2018-09-01 LAB — PROTIME-INR
INR: 3.1 — ABNORMAL HIGH (ref 0.8–1.2)
Prothrombin Time: 31.3 seconds — ABNORMAL HIGH (ref 11.4–15.2)

## 2018-09-01 LAB — BASIC METABOLIC PANEL
Anion gap: 11 (ref 5–15)
BUN: 11 mg/dL (ref 8–23)
CO2: 17 mmol/L — ABNORMAL LOW (ref 22–32)
Calcium: 9 mg/dL (ref 8.9–10.3)
Chloride: 109 mmol/L (ref 98–111)
Creatinine, Ser: 0.86 mg/dL (ref 0.61–1.24)
GFR calc Af Amer: >60 mL/min (ref >60)
GFR calc non Af Amer: >60 mL/min (ref >60)
Glucose, Bld: 169 mg/dL — ABNORMAL HIGH (ref 70–99)
Potassium: 3.9 mmol/L (ref 3.5–5.1)
Sodium: 137 mmol/L (ref 135–145)

## 2018-09-01 LAB — CBC
HCT: 44.4 % (ref 39.0–52.0)
Hemoglobin: 14.9 g/dL (ref 13.0–17.0)
MCH: 28.7 pg (ref 26.0–34.0)
MCHC: 33.6 g/dL (ref 30.0–36.0)
MCV: 85.5 fL (ref 80.0–100.0)
Platelets: 200 10*3/uL (ref 150–400)
RBC: 5.19 MIL/uL (ref 4.22–5.81)
RDW: 16.6 % — ABNORMAL HIGH (ref 11.5–15.5)
WBC: 6 10*3/uL (ref 4.0–10.5)
nRBC: 0 % (ref 0.0–0.2)

## 2018-09-01 LAB — HEMOGLOBIN A1C
Hgb A1c MFr Bld: 7.8 % — ABNORMAL HIGH (ref 4.8–5.6)
Mean Plasma Glucose: 177.16 mg/dL

## 2018-09-01 LAB — VANCOMYCIN, TROUGH: Vancomycin Tr: 23 ug/mL (ref 15–20)

## 2018-09-01 MED ORDER — WARFARIN SODIUM 2.5 MG PO TABS
2.5000 mg | ORAL_TABLET | Freq: Once | ORAL | Status: AC
  Administered 2018-09-01: 18:00:00 2.5 mg via ORAL
  Filled 2018-09-01: qty 1

## 2018-09-01 MED ORDER — VANCOMYCIN HCL 10 G IV SOLR
1500.0000 mg | Freq: Two times a day (BID) | INTRAVENOUS | Status: DC
  Administered 2018-09-01 – 2018-09-03 (×4): 1500 mg via INTRAVENOUS
  Filled 2018-09-01 (×6): qty 1500

## 2018-09-01 NOTE — Progress Notes (Signed)
Physical Therapy Session Note  Patient Details  Name: [name] [name] MRN: [id number] Date of Birth: 10/30/1955  Today's Date: 09/01/2018 PT Individual Time: 0745-0900 PT Individual Time Calculation (min): 75 min   Short Term Goals: Week 1:  PT Short Term Goal 1 (Week 1): = LTG  Skilled Therapeutic Interventions/Progress Updates:    session focused on functional transfer training to a variety of surfaces.  Pt performs transfers to bed, w/c without cushion, toilet, w/c with cushion all with set up assist, increased time.  Pt performs toileting and clothing management all with supervision.  Uphill transfer training to simulate pt's brother's [name] [name] with pt requiring min/mod A for uphill transfers with sliding board. Pt requires increased time with all mobility due to decreased UE strength and endurance. Pt left in w/c with all needs at hand.  Therapy Documentation Precautions:  Precautions Precautions: Fall Precaution Comments: bilateral AKA Restrictions Weight Bearing Restrictions: Yes RLE Weight Bearing: Non weight bearing LLE Weight Bearing: Non weight bearing Pain: Pt c/o pain at PEG tube site with mobility, RN made aware   Therapy/Group: Individual Therapy  Candance Bohlman 09/01/2018, 8:59 AM

## 2018-09-01 NOTE — Progress Notes (Signed)
ANTICOAGULATION CONSULT NOTE  Pharmacy Consult for warfarin Indication: history afib/RUE DVT  Allergies  Allergen Reactions  . Penicillins Anaphylaxis    Tolerated cefepime and ceftriaxone  Has patient had a PCN reaction causing immediate rash, facial/tongue/throat swelling, SOB or lightheadedness with hypotension: Yes Has patient had a PCN reaction causing severe rash involving mucus membranes or skin necrosis: No Has patient had a PCN reaction that required hospitalization No Has patient had a PCN reaction occurring within the last 10 years: No If all of the above answers are "NO", then may proceed with Cephalosporin use.  Marland Kitchen Penicillins Swelling    SWELLING REACTION UNSPECIFIED   . Prednisone Anaphylaxis  . Prednisone Swelling    THROAT  . Acetaminophen Other (See Comments)    Affects liver  . Tramadol Nausea Only and Other (See Comments)     Nausea and eyes were bloodshot red  . Latex Itching    Pt. Stated  Latex was indicated during hypersensitivity panel  . Tylenol [Acetaminophen]     Due to liver function per patient  . Latex Rash  . Other Rash    Plastic  . Trazodone Other (See Comments)    Made eyes red     Vital Signs: Temp: 98.1 F (36.7 C) (04/27 0456) BP: 102/61 (04/27 0456) Pulse Rate: 76 (04/27 0456)  Labs: Recent Labs    08/30/18 0536 08/31/18 0539 09/01/18 0533  HGB  --   --  14.9  HCT  --   --  44.4  PLT  --   --  200  LABPROT 27.5* 28.0* 31.3*  INR 2.6* 2.7* 3.1*  CREATININE  --   --  0.86    Estimated Creatinine Clearance: 57.9 mL/min (by C-G formula based on SCr of 0.86 mg/dL).  Assessment: 63 yo male transferred to Rehab from [employer]. He is on warfarin for history of afib/ RUE DVT. Pharmacy was consulted to dose warfarin.  Today the INR is 3.1, slightly above therapeutic range  Note significant increase of INR on 4/23-4/24 of 1.6 to 2.7 Prior to transfer to CIR he received 7.5mg  daily at [facility], dose  increased on 08/25/18 from 5 mg daily.  INR trend last several days 2.0>1.3>1.2>1.6 > 2.7>2.6>2.7 > 3.1 Rifampin d/c 4/26, may see lower warfarin dosage requirement.  PTA Amiodarone continues, dose reduced from 200mg  > 100mg  bid  PTA regimen per SNF: 7.5mg /d (increased on 4/20 from 5mg /day)  Goal of Therapy:  INR 2-3 Monitor platelets by anticoagulation protocol: Yes   Plan:  -Warfarin 2.5 mg po today -Daily PT/INR -Monitor for s/sx of bleeding  Otho Bellows PharmD Clinical Pharmacist Please check AMION for all [facility] Pharmacy phone numbers After 10:00 PM, call Main Pharmacy [phone] 09/01/2018 10:39 AM  **Pharmacist phone directory can now be found on [email or web address] listed under [facility]**

## 2018-09-01 NOTE — Progress Notes (Signed)
Physical Therapy Session Note  Patient Details  Name: [name] [name] MRN: [id number] Date of Birth: 10-28-1955  Today's Date: 09/01/2018 PT Individual Time: 4818-5631 PT Individual Time Calculation (min): 54 min   Short Term Goals: Week 1:  PT Short Term Goal 1 (Week 1): = LTG  Skilled Therapeutic Interventions/Progress Updates: Pt presented in w/c agreeable to therapy. Pt indicating pain at peg tube site and requesting pain meds with nsg notified. Pt propelled to rehab gym and performed AP transfer to mat. Pt participated in seated BUE therex for endurance and strengthening. Performed bicep curls, shoulder flexion, circles CW/CCW 2 x 15 with 3lb dowel, rows with 3lb dowel and green resistance band. Pt also participated in D1/D2 PNF with 2Kg ball 2 x 10 and tossed ball x 20. Pt attempted triceps push ups however unable to clear mat. Pt transported back to w/c and propelled back to room where pt performed AP transfer to bed. Pt repositioned to comfort and left with nsg present providing pain meds.      Therapy Documentation Precautions:  Precautions Precautions: Fall Precaution Comments: bilateral AKA Restrictions Weight Bearing Restrictions: No RLE Weight Bearing: Non weight bearing LLE Weight Bearing: Non weight bearing General:   Vital Signs: Therapy Vitals Temp: 98.2 F (36.8 C) Temp Source: Oral Pulse Rate: 75 Resp: 20 BP: 118/81 Patient Position (if appropriate): Lying Oxygen Therapy SpO2: 97 % O2 Device: Room Air    Therapy/Group: Individual Therapy  Leni Pankonin  Samarra Ridgely, PTA  09/01/2018, 4:07 PM

## 2018-09-01 NOTE — Progress Notes (Signed)
Occupational Therapy Session Note  Patient Details  Name: [name] [name] MRN: [id number] Date of Birth: [birth year]  Today's Date: 09/01/2018 OT Individual Time: 1310-1405 OT Individual Time Calculation (min): 55 min    Short Term Goals: Week 1:  OT Short Term Goal 1 (Week 1): Pt will complete toilet transfer with supervision OT Short Term Goal 2 (Week 1): Pt will complete toileting tasks with supervision  Skilled Therapeutic Interventions/Progress Updates:    Treatment session with focus on functional mobility, BUE strengthening, and d/c planning.  Pt received upright in bed finishing lunch.  Therapist stepped out to allow pt to focus on meal as he is quite verbose and easily distracted.  Upon return, pt completed bed mobility and anterior scoot in to w/c with supervision and increased time due to BUE weakness.  Discussed home setup and routine with self-care tasks and mobility.  Pt propelled w/c to therapy gym and engaged in 3 sets of 10 chest presses, overhead presses, and PNF pattern diagonals for BUE strengthening.  Pt required cues and redirection to attend to task as easily distracted.  Discussed power w/c vs manual w/c for overall endurance, home mobility, and community mobility.  Pt returned to room as above and transferred back to bed with anterior scoot method.  Pt left with all needs in reach.  Therapy Documentation Precautions:  Precautions Precautions: Fall Precaution Comments: bilateral AKA Restrictions Weight Bearing Restrictions: No RLE Weight Bearing: Non weight bearing LLE Weight Bearing: Non weight bearing General:   Vital Signs: Therapy Vitals Temp: 98.2 F (36.8 C) Temp Source: Oral Pulse Rate: 75 Resp: 20 BP: 118/81 Patient Position (if appropriate): Lying Oxygen Therapy SpO2: 97 % O2 Device: Room Air Pain: Pain Assessment Pain Scale: 0-10 Pain Score: 7  Pain Type: Chronic pain;Acute pain Pain Location: Back Pain Orientation: Left;Lower Pain  Radiating Towards: feet Pain Descriptors / Indicators: Aching;Discomfort;Sharp Pain Frequency: Intermittent Pain Onset: Progressive Patients Stated Pain Goal: 2 Pain Intervention(s): Medication (See eMAR) Multiple Pain Sites: No   Therapy/Group: Individual Therapy  Rosalio Loud 09/01/2018, 3:09 PM

## 2018-09-01 NOTE — Progress Notes (Signed)
Pharmacy Antibiotic Note  [name] [name] is a 63 y.o. male [facility]. He is on vancomycin for MRSE endocarditis. Pharmacy has been consulted for vancomycin dosing. Last day of therapy is noted as 5/17 -SCr= 0.65 on 4/21; 0.86 on 4/27 -prior vancomycin dose at [facility]: 1gm IV q8h (last dose 5:30am on 4/23) -Vancomycin trough on 4/22: 18 -Vancomycin trough on 4/27: 23  Plan: -Adjust vancomycin to 1500 mg IV q12h (estimated trough ~ 16.7) -Will follow renal function, cultures and clinical progress   Height: 4' (121.9 cm) Weight: 179 lb 7.3 oz (81.4 kg) IBW/kg (Calculated) : 22.4  Temp (24hrs), Avg:97.9 F (36.6 C), Min:97.5 F (36.4 C), Max:98.1 F (36.7 C)  Recent Labs  Lab 08/26/18 0537 08/26/18 1314 08/27/18 1311 08/29/18 0545 09/01/18 0533  WBC 7.1 7.3  --  4.5 6.0  CREATININE 0.70 0.65  --  0.82 0.86  VANCOTROUGH  --   --  18  --  23*    Estimated Creatinine Clearance: 57.9 mL/min (by C-G formula based on SCr of 0.86 mg/dL).    Allergies  Allergen Reactions  . Penicillins Anaphylaxis    Tolerated cefepime and ceftriaxone  Has patient had a PCN reaction causing immediate rash, facial/tongue/throat swelling, SOB or lightheadedness with hypotension: Yes Has patient had a PCN reaction causing severe rash involving mucus membranes or skin necrosis: No Has patient had a PCN reaction that required hospitalization No Has patient had a PCN reaction occurring within the last 10 years: No If all of the above answers are "NO", then may proceed with Cephalosporin use.  Marland Kitchen Penicillins Swelling    SWELLING REACTION UNSPECIFIED   . Prednisone Anaphylaxis  . Prednisone Swelling    THROAT  . Acetaminophen Other (See Comments)    Affects liver  . Tramadol Nausea Only and Other (See Comments)     Nausea and eyes were bloodshot red  . Latex Itching    Pt. Stated  Latex was indicated during hypersensitivity panel  . Tylenol [Acetaminophen]     Due  to liver function per patient  . Latex Rash  . Other Rash    Plastic  . Trazodone Other (See Comments)    Made eyes red     Antimicrobials this admission: Vanc 4/6>> (5/17)  Danae Orleans, PharmD PGY1 Pharmacy Resident Phone [phone] 09/01/2018       7:05 AM
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
## 2018-09-02 ENCOUNTER — Inpatient Hospital Stay (HOSPITAL_COMMUNITY): Payer: Medicare Other | Admitting: Physical Therapy

## 2018-09-02 ENCOUNTER — Inpatient Hospital Stay (HOSPITAL_COMMUNITY): Payer: Self-pay | Admitting: Physical Therapy

## 2018-09-02 ENCOUNTER — Inpatient Hospital Stay (HOSPITAL_COMMUNITY): Payer: Medicare Other | Admitting: Occupational Therapy

## 2018-09-02 DIAGNOSIS — E119 Type 2 diabetes mellitus without complications: Secondary | ICD-10-CM

## 2018-09-02 DIAGNOSIS — R791 Abnormal coagulation profile: Secondary | ICD-10-CM

## 2018-09-02 LAB — GLUCOSE, CAPILLARY
Glucose-Capillary: 98 mg/dL (ref 70–99)
Glucose-Capillary: 83 mg/dL (ref 70–99)
Glucose-Capillary: 124 mg/dL — ABNORMAL HIGH (ref 70–99)

## 2018-09-02 LAB — PROTIME-INR
INR: 3.1 — ABNORMAL HIGH (ref 0.8–1.2)
Prothrombin Time: 31.4 seconds — ABNORMAL HIGH (ref 11.4–15.2)

## 2018-09-02 MED ORDER — WARFARIN SODIUM 1 MG PO TABS
1.0000 mg | ORAL_TABLET | Freq: Once | ORAL | Status: AC
  Administered 2018-09-02: 1 mg via ORAL
  Filled 2018-09-02: qty 1

## 2018-09-02 MED ORDER — TOPIRAMATE 100 MG PO TABS
300.0000 mg | ORAL_TABLET | Freq: Every day | ORAL | Status: DC
  Administered 2018-09-03 – 2018-09-05 (×3): 300 mg via ORAL
  Filled 2018-09-02 (×4): qty 3

## 2018-09-02 MED ORDER — INSULIN ASPART 100 UNIT/ML ~~LOC~~ SOLN
0.0000 [IU] | Freq: Three times a day (TID) | SUBCUTANEOUS | Status: DC
  Administered 2018-09-04: 1 [IU] via SUBCUTANEOUS

## 2018-09-02 NOTE — Progress Notes (Signed)
ANTICOAGULATION CONSULT NOTE  Pharmacy Consult for warfarin Indication: history afib/RUE DVT  Allergies  Allergen Reactions  . Penicillins Anaphylaxis    Tolerated cefepime and ceftriaxone  Has patient had a PCN reaction causing immediate rash, facial/tongue/throat swelling, SOB or lightheadedness with hypotension: Yes Has patient had a PCN reaction causing severe rash involving mucus membranes or skin necrosis: No Has patient had a PCN reaction that required hospitalization No Has patient had a PCN reaction occurring within the last 10 years: No If all of the above answers are "NO", then may proceed with Cephalosporin use.  Marland Kitchen Penicillins Swelling    SWELLING REACTION UNSPECIFIED   . Prednisone Anaphylaxis  . Prednisone Swelling    THROAT  . Acetaminophen Other (See Comments)    Affects liver  . Tramadol Nausea Only and Other (See Comments)     Nausea and eyes were bloodshot red  . Latex Itching    Pt. Stated  Latex was indicated during hypersensitivity panel  . Tylenol [Acetaminophen]     Due to liver function per patient  . Latex Rash  . Other Rash    Plastic  . Trazodone Other (See Comments)    Made eyes red    Vital Signs: Temp: 97.6 F (36.4 C) (04/28 0458) BP: 99/69 (04/28 0458) Pulse Rate: 54 (04/28 0458)  Labs: Recent Labs    08/31/18 0539 09/01/18 0533 09/02/18 0526  HGB  --  14.9  --   HCT  --  44.4  --   PLT  --  200  --   LABPROT 28.0* 31.3* 31.4*  INR 2.7* 3.1* 3.1*  CREATININE  --  0.86  --    Estimated Creatinine Clearance: 57.7 mL/min (by C-G formula based on SCr of 0.86 mg/dL).  Assessment: 63 yo male transferred to Rehab from [employer] development [employer]. He is on warfarin for history of afib/ RUE DVT. Pharmacy was consulted to dose warfarin.  Today, 09/02/2018 INR continues at 3.1, slightly above therapeutic range, dose decreased yesterday Hesitate to hold Warfarin completely.   Note significant increase of INR on 4/23-4/24 of 1.6 to 2.7 Prior to  transfer to CIR he received 7.5mg  daily at [facility], dose increased on 08/25/18 from 5 mg daily.  INR trend last several days 2.0>1.3>1.2>1.6 > 2.7>2.6>2.7 > 3.1> 3.1 Rifampin d/c 4/26, may see lower warfarin dosage requirement.  PTA Amiodarone continues, dose reduced from 200mg  > 100mg  bid  PTA regimen per SNF: 7.5mg /d (increased on 4/20 from 5mg /day)  Goal of Therapy:  INR 2-3 Monitor platelets by anticoagulation protocol: Yes   Plan:  -Warfarin 1 mg po today -Daily PT/INR -Monitor for s/sx of bleeding  Otho Bellows PharmD Clinical Pharmacist [phone] (till 5:30p) Please check AMION for all [facility] Pharmacy phone numbers After 10:00 PM, call Main Pharmacy [phone] 09/02/2018 10:50 AM  **Pharmacist phone directory can now be found on [email or web address] listed under [facility]**

## 2018-09-02 NOTE — Progress Notes (Signed)
Occupational Therapy Session Note  Patient Details  Name: [name] [name] MRN: [id number] Date of Birth: 06/26/1955  Today's Date: 09/02/2018 OT Individual Time: 0850-1000 OT Individual Time Calculation (min): 70 min    Short Term Goals: Week 1:  OT Short Term Goal 1 (Week 1): Pt will complete toilet transfer with supervision OT Short Term Goal 2 (Week 1): Pt will complete toileting tasks with supervision  Skilled Therapeutic Interventions/Progress Updates:    Treatment session with focus on ADL retraining and bathroom transfers.  Pt received supine in bed agreeable to shower.  Pt completed bed mobility with increased time and use of bed rails for supine to sit.  Completed anterior scoot on to w/c with supervision and then transferred on to toilet with anterior scoot pivot with close supervision.  Therapist assisted with removing incontinence brief while pt completed leans on toilet.  Anterior transfer toilet > w/c and then posterior transfer w/c > tub bench with supervision/setup.  Pt completed bathing with lateral leans and use of toilet cut out on tub bench when washing buttocks.  Pt removed pad on buttocks post BM and requested to leave it off during bathing.  RN notified of pt removal of pad and need to reapply before next therapy session.  Pt transferred tub bench > w/c > bed with anterior scoot pivot with supervision and increased time due to fatigue post shower.  Pt left in sidelying without pants to allow RN to apply new dressing.  Therapy Documentation Precautions:  Precautions Precautions: Fall Precaution Comments: bilateral AKA Restrictions Weight Bearing Restrictions: No RLE Weight Bearing: Non weight bearing LLE Weight Bearing: Non weight bearing Pain: Pain Assessment Pain Scale: 0-10 Pain Score: 0-No pain   Therapy/Group: Individual Therapy  Rosalio Loud 09/02/2018, 12:07 PM

## 2018-09-02 NOTE — Progress Notes (Addendum)
A/O, no noted distress. Patient noted, "I am sad my girlfriend passed away while I was in here. We, both been through a lot. I was there for her." Pt wanted to know why he was on Vanc. And why his blood was drawn nearly everyday.   Pt was made know vanc was administered to him resulted in spesis/endocarditits and explanation why the importance of labs.

## 2018-09-02 NOTE — Plan of Care (Signed)
  Problem: RH SKIN INTEGRITY Goal: RH STG MAINTAIN SKIN INTEGRITY WITH ASSISTANCE Description STG Maintain Skin Integrity With mod Assistance.  Outcome: Progressing  Continue to administered topical on BUE

## 2018-09-02 NOTE — Progress Notes (Signed)
[place] PHYSICAL MEDICINE & REHABILITATION PROGRESS NOTE  Subjective/Complaints: Patient seen sitting up in bed this morning.  He states he slept well overnight.  He has questions regarding source of his infection.  ROS: + Right arm rash, improving.  Denies painful rash, CP, shortness of breath, nausea, vomiting, diarrhea.  Objective: Vital Signs: Blood pressure 99/69, pulse (!) 54, temperature 97.6 F (36.4 C), resp. rate 18, height 4' (1.219 m), weight 81 kg, SpO2 97 %. No results found. Recent Labs    09/01/18 0533  WBC 6.0  HGB 14.9  HCT 44.4  PLT 200   Recent Labs    09/01/18 0533  NA 137  K 3.9  CL 109  CO2 17*  GLUCOSE 169*  BUN 11  CREATININE 0.86  CALCIUM 9.0    Physical Exam: BP 99/69 (BP Location: Left Arm)   Pulse (!) 54   Temp 97.6 F (36.4 C)   Resp 18   Ht 4' (1.219 m)   Wt 81 kg   SpO2 97%   BMI 54.49 kg/m  Constitutional: No distress . Vital signs reviewed. HENT: Normocephalic.  Atraumatic. Eyes: EOMI.  No discharge. Cardiovascular: No JVD. Respiratory: Normal effort. GI: Non-distended. Musc: Bilateral AKA Neurologic: Alert and oriented Motor: Bilateral upper extremities 4+-5/5 proximal and distal Bilateral lower extremities: Hip flexion: 4/5, improving Skin: Maculopapular rash on right forearm and hand, improving Psych: Normal mood.  Normal behavior.  Assessment/Plan: 1. Functional deficits secondary to debility with history of bilateral AKA which require 3+ hours per day of interdisciplinary therapy in a comprehensive inpatient rehab setting.  Physiatrist is providing close team supervision and 24 hour management of active medical problems listed below.  Physiatrist and rehab team continue to assess barriers to discharge/monitor patient progress toward functional and medical goals  Care Tool:  Bathing    Body parts bathed by patient: Right arm, Left arm, Chest, Abdomen, Front perineal area, Right upper leg, Left upper leg   Body parts bathed by helper: Buttocks Body parts n/a: Right lower leg, Left lower leg(B AKA)   Bathing assist Assist Level: Minimal Assistance - Patient > 75%     Upper Body Dressing/Undressing Upper body dressing   What is the patient wearing?: Pull over shirt    Upper body assist Assist Level: Supervision/Verbal cueing    Lower Body Dressing/Undressing Lower body dressing      What is the patient wearing?: Pants, Underwear/pull up     Lower body assist Assist for lower body dressing: Minimal Assistance - Patient > 75%(extra time to complete lateral leans and lateral rolls to pull  up underwear and shorts)     Toileting Toileting    Toileting assist Assist for toileting: Minimal Assistance - Patient > 75%     Transfers Chair/bed transfer  Transfers assist     Chair/bed transfer assist level: Supervision/Verbal cueing     Locomotion Ambulation   Ambulation assist   Ambulation activity did not occur: N/A          Walk 10 feet activity   Assist           Walk 50 feet activity   Assist           Walk 150 feet activity   Assist           Walk 10 feet on uneven surface  activity   Assist           Wheelchair     Assist Will patient use wheelchair at  discharge?: Yes Type of Wheelchair: Manual    Wheelchair assist level: Supervision/Verbal cueing Max wheelchair distance: 12'    Wheelchair 50 feet with 2 turns activity    Assist        Assist Level: Supervision/Verbal cueing   Wheelchair 150 feet activity     Assist     Assist Level: Supervision/Verbal cueing      Medical Problem List and Plan: 1. Debility secondary to respiratory failure due to pneumonia and exacerbation of CHF  Continue CIR 2. Antithrombotics: -RUE DVT/anticoagulation:Pharmaceutical:Coumadin  INR supratherapeutic on 4/28  CBC within normal limits on 4/27 -antiplatelet therapy: N/A 3. Pain Management:  4.  Mood:LCSW to follow for evaluation and support. -antipsychotic agents: Seroquel and klonopin. 5. Neuropsych: This patient is capable of making decisions on his own behalf. 6. Skin/Wound Care:routine pressure relief measures.  Erythematous rash pt also states intermittent papules, given nailbed changes and prior rx of fungal cream, started on clotrimazole- appears to be improving 7. Fluids/Electrolytes/Nutrition:Tolerating regular diet. Monitor I/O.  8. Sepsis/endocarditis: To continue Vanc through 5/17 and Rifampin through 4/25. D/Ced on 4/26  Vanco trough elevated on 4/27 9. COPD: Respiratory status stable on prn nebs.  10 A fib with RVR: Monitor HR bid. Continue amiodarone, digoxin, diltiazem. Medications adjusted due to bradycardia.    Vitals:   09/01/18 1957 09/02/18 0458  BP: 104/66 99/69  Pulse: (!) 58 (!) 54  Resp: 20 18  Temp:  97.6 F (36.4 C)  SpO2:  97%   Metoprolol decreased to 25 mg twice daily  BP/heart rate low, will consider further decrease medications tomorrow if persistent  Continue to monitor 11. Neurogenic bladder?: continue flomax.   PVRs remain labile on 4/28, monitor for trend 12. 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    States he was using prosthesis for ambulation until he started developing pressure sores  Will monitor and consider prosthetists evaluation for adjustments if necessary 13. Seizure disorder/chronic HA: Continue topiramate and Elavil.  14. Bipolar disorder: Stable on Seroquel and Klonpin. 15.  Hypokalemia  Potassium 3.9 on 4/27 after supplementation on 4/24 16.  New diagnosis of diabetes mellitus type 2  Hemoglobin A1c 6.2 in 2017, repeat hemoglobin A1c 7.8 on 4/27.  Diet changed to carb modified  CBGs ordered  SSI ordered   LOS: 5 days A FACE TO FACE EVALUATION WAS PERFORMED  Zaley Talley Karis Juba 09/02/2018, 8:18 AM

## 2018-09-02 NOTE — Progress Notes (Addendum)
Physical Therapy Session Note  Patient Details  Name: [name] [name] MRN: [id number] Date of Birth: August 05, 1955  Today's Date: 09/02/2018 PT Individual Time: 1130-1225 and 2563-8937 PT Individual Time Calculation (min): 55 min and 53 min   Short Term Goals: Week 1:  PT Short Term Goal 1 (Week 1): = LTG  Skilled Therapeutic Interventions/Progress Updates:    pt rec'd in bed. Pt requires set up assist and much increased time to don pants at bed level.  Transfers to bed and w/c during session with supervision, increased time.  UBE for UE strength and endurance x 6 minutes with 2 rest breaks.  Supine LE therex for hip ext, abd, sidelying hip ext and abduction all 2 x 10.  Pt left in w/c with needs at hand.  Session 2:  Pt c/o pain in back, RN made aware, rests given as needed.  Session focused on UE strengthening and endurance.  Punching bag with pt performing 10 x 30 seconds of punches.  Seated on mat pt performs ball tap with 5# dowel with supervision for sitting balance.  UE therex 2 x 10 bicep curl, shoulder flex, circles CW/CCW, shoulder abd/add.  Pt given handout with theraband exercises and theraband. Pt able to verbalize understanding. Pt left in bed with alarm set, needs at hand.  Therapy Documentation Precautions:  Precautions Precautions: Fall Precaution Comments: bilateral AKA Restrictions Weight Bearing Restrictions: No RLE Weight Bearing: Non weight bearing LLE Weight Bearing: Non weight bearing Pain:  no c/o pain   Therapy/Group: Individual Therapy  [name] 09/02/2018, 1:13 PM

## 2018-09-03 ENCOUNTER — Inpatient Hospital Stay (HOSPITAL_COMMUNITY): Payer: Medicare Other | Admitting: Physical Therapy

## 2018-09-03 ENCOUNTER — Inpatient Hospital Stay (HOSPITAL_COMMUNITY): Payer: Medicare Other | Admitting: Occupational Therapy

## 2018-09-03 DIAGNOSIS — R791 Abnormal coagulation profile: Secondary | ICD-10-CM

## 2018-09-03 LAB — GLUCOSE, CAPILLARY
Glucose-Capillary: 109 mg/dL — ABNORMAL HIGH (ref 70–99)
Glucose-Capillary: 106 mg/dL — ABNORMAL HIGH (ref 70–99)
Glucose-Capillary: 87 mg/dL (ref 70–99)
Glucose-Capillary: 90 mg/dL (ref 70–99)

## 2018-09-03 LAB — PROTIME-INR
INR: 1.9 — ABNORMAL HIGH (ref 0.8–1.2)
Prothrombin Time: 21.5 seconds — ABNORMAL HIGH (ref 11.4–15.2)

## 2018-09-03 LAB — VANCOMYCIN, TROUGH
Vancomycin Tr: 52 ug/mL (ref 15–20)
Vancomycin Tr: 23 ug/mL (ref 15–20)

## 2018-09-03 MED ORDER — WARFARIN SODIUM 7.5 MG PO TABS
7.5000 mg | ORAL_TABLET | Freq: Once | ORAL | Status: AC
  Administered 2018-09-03: 7.5 mg via ORAL
  Filled 2018-09-03: qty 1

## 2018-09-03 MED ORDER — VANCOMYCIN HCL 10 G IV SOLR
1250.0000 mg | Freq: Two times a day (BID) | INTRAVENOUS | Status: DC
  Administered 2018-09-03 – 2018-09-06 (×6): 1250 mg via INTRAVENOUS
  Filled 2018-09-03 (×8): qty 1250

## 2018-09-03 MED ORDER — METOPROLOL TARTRATE 12.5 MG HALF TABLET
12.5000 mg | ORAL_TABLET | Freq: Three times a day (TID) | ORAL | Status: DC
  Administered 2018-09-03 – 2018-09-06 (×8): 12.5 mg via ORAL
  Filled 2018-09-03 (×8): qty 1

## 2018-09-03 NOTE — Progress Notes (Signed)
CRITICAL VALUE ALERT  Critical Value:  Vanc 61  Date & Time Notied:  [id number]  Provider Notified: Deatra Ina, NP  Orders Received/Actions taken: no new orders at this time

## 2018-09-03 NOTE — Progress Notes (Signed)
Physical Therapy Session Note  Patient Details  Name: [name] [name] MRN: [id number] Date of Birth: [birth year]  Today's Date: 09/03/2018 PT Individual Time: 0952-1102 PT Individual Time Calculation (min): 70 min   Short Term Goals: Week 1:  PT Short Term Goal 1 (Week 1): = LTG  Skilled Therapeutic Interventions/Progress Updates:    Patient received in bed, pleasant and willing to participate with PT, able to complete all bed mobility and AP transfer to [facility] with S; attempted to use sliding board or perform more standard AP transfer, however patient declines, scooting into chair forwards and then turning around to face forward once in [place]. Able to self-propel WC approximately 169f with B UEs, then performed traditional AP transfer onto mat table from chair with S. Otherwise focused on functional UE exercises and general functional activity tolerance this session, including tapping tossed ball with weighted rods, shoulder flexion/abduction with 3#, isotonic shoulder ABD and flexion with 3kg ball, and boxing combinations at edge of mat table. [facility] and again self-propelled back to his room with S, transferred back to bed via AP method also with S and was left in bed with all needs met and bed alarm active.   Therapy Documentation Precautions:  Precautions Precautions: Fall Precaution Comments: bilateral AKA Restrictions Weight Bearing Restrictions: Yes RLE Weight Bearing: Non weight bearing LLE Weight Bearing: Non weight bearing   Pain: Pain Assessment Pain Scale: 0-10 Pain Score: 0-No pain    Therapy/Group: Individual Therapy   KDeniece ReePT, DPT, CBIS  Supplemental Physical Therapist [facility]   Pager [phone] Rehab Office [phone]   09/03/2018, 1:06 PM

## 2018-09-03 NOTE — Patient Care Conference (Signed)
Inpatient RehabilitationTeam Conference and Plan of Care Update Date: 09/03/2018   Time: 2:15 PM    Patient Name: [name] [name]      Medical Record Number: [id number]  Date of Birth: October 24, 1955 Sex: Male         Room/Bed: 4M01C/4M01C-01 Payor Info: Payor: MEDICARE / Plan: MEDICARE PART A AND B / Product Type: *No Product type* /    Admitting Diagnosis: debility adn respiratory failure  Admit Date/Time:  08/28/2018  2:46 PM Admission Comments: No comment available   Primary Diagnosis:  <principal problem not specified> Principal Problem: <principal problem not specified>  Patient Active Problem List   Diagnosis Date Noted  . Subtherapeutic international normalized ratio (INR)   . Diabetes mellitus, new onset (HCC)   . Supratherapeutic INR   . Prediabetes   . Neurogenic bladder   . Chronic post-traumatic stress disorder (PTSD)   . Hypokalemia   . Atrial fibrillation with rapid ventricular response (HCC)   . Acute bacterial endocarditis   . Acute deep vein thrombosis (DVT) of right upper extremity (HCC)   . Debility 08/28/2018  . Acute on chronic respiratory failure with hypoxia (HCC)   . COPD, severe (HCC)   . Chronic atrial fibrillation   . Acute systolic heart failure (HCC)   . Lobar pneumonia, unspecified organism (HCC)   . Phantom limb pain (HCC) 12/19/2016  . Paroxysmal atrial fibrillation (HCC) 05/17/2015  . Humerus fracture 05/13/2015  . Adjustment disorder with mixed anxiety and depressed mood 02/22/2015  . Schizophrenia (HCC) 02/21/2015  . Chronic pain 02/21/2015  . S/P bilateral above knee amputation (HCC) 11/24/2013  . Paroxysmal supraventricular tachycardia (HCC) 11/24/2013  . Seizure disorder (HCC) 11/24/2013  . Tobacco abuse 11/24/2013    Expected Discharge Date: Expected Discharge Date: 09/06/18  Team Members Present: Physician leading conference: Dr. Maryla Morrow Social Worker Present: Dossie Der, LCSW Nurse Present: Vincente Poli, RN PT Present:  Wanda Plump, PT OT Present: Roney Mans, OT;Ardis Rowan, COTA SLP Present: Feliberto Gottron, SLP     Current Status/Progress Goal Weekly Team Focus  Medical   Debility secondary to respiratory failure due to pneumonia and exacerbation of CHF  Improve mobility, endurance, abx levels, DM, anticoagulation  See above   Bowel/Bladder   continent of b/b; LBM: 04/28  remain continent of b/b; maintain regular bowel pattern   assist with tolieting needs prn    Swallow/Nutrition/ Hydration             ADL's   close supervision bathroom transfers, Min A LB dressing, requires increased time due to decreased endurance and weakness  Supervision  LB dressing, BUE strengthening/endurance for transfers and mobility, d/c planning   Mobility   supervision transfers, min A car transfer, mod I w/c  mod I w/c level  d/c planning, power w/c eval, safety with transfers   Communication             Safety/Cognition/ Behavioral Observations            Pain   c/o generalized soreness from therapy; PRN oxycodone q6hr  pain level <2/10  assess pain level qshift anf prn    Skin   gastrostomy/enterostomy LUQ, clamped; closed incision to neck, scabbed and healing; stage II on saccrum with foam in place; Bilateral AKA, healed  remain free of new skin breakdown/infection; treat current skin issues per orders  assess skin qshift and prn       *See Care Plan and progress notes for long and short-term goals.  Barriers to Discharge  Current Status/Progress Possible Resolutions Date Resolved   Physician    Medical stability     See above  Therapies, adjust Vanc levels, follow CBGs, transition to Eliquis      Nursing                  PT                    OT                  SLP                SW                Discharge Planning/Teaching Needs:  HOme with intermittent assist from family-brother. Pt will need to be mod/i to be safe home alone      Team Discussion:  Goals supervision wheelchair  level. BP and HR running low and MD adjuting meds. New DM doing well with BS. DC PEG and will need IV antibiotics at home through 5/17. Eliquis started today. Power wheelchair eval today. Mid-line in already. Brother to stay with at DC. No family ed needed with brother pt can self direct his care.  Revisions to Treatment Plan:  DC 5/2    Continued Need for Acute Rehabilitation Level of Care: The patient requires daily medical management by a physician with specialized training in physical medicine and rehabilitation for the following conditions: Daily direction of a multidisciplinary physical rehabilitation program to ensure safe treatment while eliciting the highest outcome that is of practical value to the patient.: Yes Daily medical management of patient stability for increased activity during participation in an intensive rehabilitation regime.: Yes Daily analysis of laboratory values and/or radiology reports with any subsequent need for medication adjustment of medical intervention for : Cardiac problems;Post surgical problems;Diabetes problems;Other   I attest that I was present, lead the team conference, and concur with the assessment and plan of the team.   Lucy Chris 09/03/2018, 3:27 PM

## 2018-09-03 NOTE — Progress Notes (Addendum)
[place] PHYSICAL MEDICINE & REHABILITATION PROGRESS NOTE  Subjective/Complaints: Patient seen sitting up in bed this morning.  He states he slept well overnight.  He has questions regarding his PEG tube.  She has no recollection of when it was placed.  Informed by nursing regarding critical value for vancomycin.  Instructed to hold at present.  ROS: + Right arm rash, improving.  Denies painful rash, CP, shortness of breath, nausea, vomiting, diarrhea.  Objective: Vital Signs: Blood pressure (!) 102/59, pulse (!) 55, temperature 97.6 F (36.4 C), temperature source Oral, resp. rate 18, height 4' (1.219 m), weight 81 kg, SpO2 99 %. No results found. Recent Labs    09/01/18 0533  WBC 6.0  HGB 14.9  HCT 44.4  PLT 200   Recent Labs    09/01/18 0533  NA 137  K 3.9  CL 109  CO2 17*  GLUCOSE 169*  BUN 11  CREATININE 0.86  CALCIUM 9.0    Physical Exam: BP (!) 102/59   Pulse (!) 55   Temp 97.6 F (36.4 C) (Oral)   Resp 18   Ht 4' (1.219 m)   Wt 81 kg   SpO2 99%   BMI 54.49 kg/m  Constitutional: No distress . Vital signs reviewed. HENT: Normocephalic.  Atraumatic.   Eyes: EOMI.  No discharge. Cardiovascular: No JVD. Respiratory: Normal effort. GI: Non-distended.  + PEG C/D/I Musc: Bilateral AKA Neurologic: Alert and oriented Motor: Bilateral upper extremities 4+-5/5 proximal and distal, stable Bilateral lower extremities: Hip flexion: 4/5, improving Skin: Maculopapular rash on right forearm and hand, gradually improving Psych: Normal mood.  Normal behavior.  Assessment/Plan: 1. Functional deficits secondary to debility with history of bilateral AKA which require 3+ hours per day of interdisciplinary therapy in a comprehensive inpatient rehab setting.  Physiatrist is providing close team supervision and 24 hour management of active medical problems listed below.  Physiatrist and rehab team continue to assess barriers to discharge/monitor patient progress toward  functional and medical goals  Care Tool:  Bathing    Body parts bathed by patient: Right arm, Left arm, Chest, Abdomen, Front perineal area, Right upper leg, Left upper leg, Buttocks, Face   Body parts bathed by helper: Buttocks Body parts n/a: Right lower leg, Left lower leg(B AKA)   Bathing assist Assist Level: Supervision/Verbal cueing     Upper Body Dressing/Undressing Upper body dressing   What is the patient wearing?: Pull over shirt    Upper body assist Assist Level: Supervision/Verbal cueing    Lower Body Dressing/Undressing Lower body dressing      What is the patient wearing?: Pants, Underwear/pull up     Lower body assist Assist for lower body dressing: Minimal Assistance - Patient > 75%(extra time to complete lateral leans and lateral rolls to pull  up underwear and shorts)     Toileting Toileting    Toileting assist Assist for toileting: Supervision/Verbal cueing     Transfers Chair/bed transfer  Transfers assist     Chair/bed transfer assist level: Supervision/Verbal cueing     Locomotion Ambulation   Ambulation assist   Ambulation activity did not occur: N/A          Walk 10 feet activity   Assist           Walk 50 feet activity   Assist           Walk 150 feet activity   Assist           Walk 10 feet  on uneven surface  activity   Assist           Wheelchair     Assist Will patient use wheelchair at discharge?: Yes Type of Wheelchair: Manual    Wheelchair assist level: Supervision/Verbal cueing Max wheelchair distance: 84'    Wheelchair 50 feet with 2 turns activity    Assist        Assist Level: Supervision/Verbal cueing   Wheelchair 150 feet activity     Assist     Assist Level: Supervision/Verbal cueing      Medical Problem List and Plan: 1. Debility secondary to respiratory failure due to pneumonia and exacerbation of CHF  Continue CIR  Team conference today to  discuss current and goals and coordination of care, home and environmental barriers, and discharge planning with nursing, case manager, and therapies.  2. Antithrombotics: -RUE DVT/anticoagulation:Pharmaceutical:Coumadin  INR subtherapeutic on 4/29  CBC within normal limits on 4/27 -antiplatelet therapy: N/A 3. Pain Management:   Topamax change to home dose of 300 nightly on 4/28 4. Mood:LCSW to follow for evaluation and support. -antipsychotic agents: Seroquel and klonopin. 5. Neuropsych: This patient is capable of making decisions on his own behalf. 6. Skin/Wound Care:routine pressure relief measures.  Erythematous rash pt also states intermittent papules, given nailbed changes and prior rx of fungal cream, started on clotrimazole- appears to be improving 7. Fluids/Electrolytes/Nutrition:Tolerating regular diet. Monitor I/O.   Will review records to determine placement of PEG with plans to DC 8. Sepsis/endocarditis: To continue Vanc through 5/17 and Rifampin through 4/25. D/Ced on 4/26  Vanco trough critical value on 4/29, will hold at present 9. COPD: Respiratory status stable on prn nebs.  10 A fib with RVR: Monitor HR bid. Continue amiodarone, digoxin, diltiazem. Medications adjusted due to bradycardia.    Vitals:   09/03/18 0433 09/03/18 0615  BP: 114/75 (!) 102/59  Pulse: (!) 53 (!) 55  Resp: 18   Temp: 97.6 F (36.4 C)   SpO2: 99%    Metoprolol decreased to 25 mg twice daily, decreased to 12.5 twice daily on 4/29  Continue to monitor 11. Neurogenic bladder?: continue flomax.   PVRs remain labile on 4/29, monitor for trend 12. 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    States he was using prosthesis for ambulation until he started developing pressure sores  Will monitor and consider prosthetists evaluation for adjustments if necessary 13. Seizure disorder/chronic HA: Continue topiramate and Elavil.  14. Bipolar disorder: Stable on Seroquel and  Klonpin. 15.  Hypokalemia  Potassium 3.9 on 4/27 after supplementation on 4/24 16.  New diagnosis of diabetes mellitus type 2  Hemoglobin A1c 6.2 in 2017, repeat hemoglobin A1c 7.8 on 4/27.  Diet changed to carb modified  SSI ordered  Monitor with change in diet  LOS: 6 days A FACE TO FACE EVALUATION WAS PERFORMED  Matis Monnier Karis Juba 09/03/2018, 8:12 AM

## 2018-09-03 NOTE — Progress Notes (Addendum)
ANTICOAGULATION CONSULT NOTE  Pharmacy Consult for warfarin transition to Apixaban Indication: history afib/RUE DVT  Allergies  Allergen Reactions  . Penicillins Anaphylaxis    Tolerated cefepime and ceftriaxone  Has patient had a PCN reaction causing immediate rash, facial/tongue/throat swelling, SOB or lightheadedness with hypotension: Yes Has patient had a PCN reaction causing severe rash involving mucus membranes or skin necrosis: No Has patient had a PCN reaction that required hospitalization No Has patient had a PCN reaction occurring within the last 10 years: No If all of the above answers are "NO", then may proceed with Cephalosporin use.  Marland Kitchen Penicillins Swelling    SWELLING REACTION UNSPECIFIED   . Prednisone Anaphylaxis  . Prednisone Swelling    THROAT  . Acetaminophen Other (See Comments)    Affects liver  . Tramadol Nausea Only and Other (See Comments)     Nausea and eyes were bloodshot red  . Latex Itching    Pt. Stated  Latex was indicated during hypersensitivity panel  . Tylenol [Acetaminophen]     Due to liver function per patient  . Latex Rash  . Other Rash    Plastic  . Trazodone Other (See Comments)    Made eyes red    Vital Signs: Temp: 97.6 F (36.4 C) (04/29 0433) Temp Source: Oral (04/29 0433) BP: 102/59 (04/29 0615) Pulse Rate: 55 (04/29 0615)  Labs: Recent Labs    09/01/18 0533 09/02/18 0526 09/03/18 0645  HGB 14.9  --   --   HCT 44.4  --   --   PLT 200  --   --   LABPROT 31.3* 31.4* 21.5*  INR 3.1* 3.1* 1.9*  CREATININE 0.86  --   --    Estimated Creatinine Clearance: 57.7 mL/min (by C-G formula based on SCr of 0.86 mg/dL).  Assessment: 63 yo male transferred to Rehab from [employer] [facility] [employer]. He is on warfarin for history of afib/ RUE DVT. Pharmacy was consulted to dose warfarin.  Today, 09/03/2018 INR decreased to 1.9 , slightly below therapeutic range, dose decreased past two days, looks like Rifampin effect on INR has  dissipated.  Prior to transfer to CIR he received 7.5mg  daily at [facility], dose increased on 08/25/18 from 5 mg daily.  INR trend last several days 2.0>1.3>1.2>1.6 > 2.7>2.6>2.7 > 3.1> 3.1 > 1.9 Rifampin d/c 4/26, may see lower warfarin dosage requirement.  PTA Amiodarone continues, dose reduced from 200mg  > 100mg  bid  PTA regimen per SNF: 7.5mg /d (increased on 4/20 from 5mg /day)  Goal of Therapy:  INR 2-3 Monitor platelets by anticoagulation protocol: Yes   Plan:  -Warfarin 7.5 mg po today- giving dose earlier than 6p -Daily PT/INR -Monitor for s/sx of bleeding  Otho Bellows PharmD Clinical Pharmacist [phone] (till 5:30p) Please check AMION for all [facility] Pharmacy phone numbers After 10:00 PM, call Main Pharmacy [phone] 09/03/2018 7:48 AM  1500: received protocol order to change anti-coagulation to Apixaban INR 1.9 this am, patient received 7.5mg  Warfarin this am 4/29  Will check INR in am 4/30, begin Apixaban tomorrow 4/30 or 5/1, dependent on INR  Thank you,  Otho Bellows PharmD [phone] 09/03/2018, 3:06 PM

## 2018-09-03 NOTE — Progress Notes (Signed)
Occupational Therapy Session Note  Patient Details  Name: [name] [name] MRN: [id number] Date of Birth: 1956/02/23  Today's Date: 09/03/2018 OT Individual Time: 1335-1430 OT Individual Time Calculation (min): 55 min    Short Term Goals: Week 1:  OT Short Term Goal 1 (Week 1): Pt will complete toilet transfer with supervision OT Short Term Goal 2 (Week 1): Pt will complete toileting tasks with supervision  Skilled Therapeutic Interventions/Progress Updates:    Facilitated power w/c eval with Josh Cadle, ATP with focus on appropriate power mobility for home setup.  Engaged pt in discussion regarding ADL management and setup in regards to power mobility while Josh measured pt.  Education regarding various powered mobility and most appropriate for pt needs.  Pt completed basic transfers bed <> w/c <> therapy mat with anterior scoot pivot with supervision.  Pt reporting ready to d/c home at end of week, hopeful to have PEG tube removed prior to d/c.  Pt left seated upright in bed with all needs in reach.  Therapy Documentation Precautions:  Precautions Precautions: Fall Precaution Comments: bilateral AKA Restrictions Weight Bearing Restrictions: Yes RLE Weight Bearing: Non weight bearing LLE Weight Bearing: Non weight bearing General:   Vital Signs: Therapy Vitals Pulse Rate: 79 Resp: 20 BP: 113/73 Patient Position (if appropriate): Sitting Oxygen Therapy SpO2: 99 % O2 Device: Room Air Pain: Pain Assessment Pain Scale: 0-10 Pain Score: 0-No pain   Therapy/Group: Individual Therapy  Rosalio Loud 09/03/2018, 3:34 PM

## 2018-09-03 NOTE — Progress Notes (Signed)
Pharmacy Antibiotic Note  [name] [name] is a 63 y.o. male [facility]. He is on vancomycin for MRSE endocarditis. Pharmacy has been consulted for vancomycin dosing. Last day of therapy is noted as 5/17  -prior [facility] dose at [facility]: 1gm IV q8h (last dose 5:30am on 4/23) -Vancomycin trough on 4/22: 18 -Vancomycin trough on 4/27: 23 -Vancomycin trough ordered 05:00 am 4/29 - resulted 52 mcg/ml, dose was hung at 06:13 before trough drawn 06:45 - repeat Vanc Trough today at 18:00 prior to 18:30 dose, RN aware not to have Vanc dose until lab drawn  Plan: Decreased vancomycin to 1500 mg IV q12h (estimated trough ~ 16.7)     Height: 4' (121.9 cm) Weight: 178 lb 9.2 oz (81 kg) IBW/kg (Calculated) : 22.4  Temp (24hrs), Avg:97.7 F (36.5 C), Min:97.6 F (36.4 C), Max:97.8 F (36.6 C)  Recent Labs  Lab 08/29/18 0545 09/01/18 0533 09/03/18 0645  WBC 4.5 6.0  --   CREATININE 0.82 0.86  --   VANCOTROUGH  --  23* 52*    Estimated Creatinine Clearance: 57.7 mL/min (by C-G formula based on SCr of 0.86 mg/dL).    Allergies  Allergen Reactions  . Penicillins Anaphylaxis    Tolerated cefepime and ceftriaxone  Has patient had a PCN reaction causing immediate rash, facial/tongue/throat swelling, SOB or lightheadedness with hypotension: Yes Has patient had a PCN reaction causing severe rash involving mucus membranes or skin necrosis: No Has patient had a PCN reaction that required hospitalization No Has patient had a PCN reaction occurring within the last 10 years: No If all of the above answers are "NO", then may proceed with Cephalosporin use.  Marland Kitchen Penicillins Swelling    SWELLING REACTION UNSPECIFIED   . Prednisone Anaphylaxis  . Prednisone Swelling    THROAT  . Acetaminophen Other (See Comments)    Affects liver  . Tramadol Nausea Only and Other (See Comments)     Nausea and eyes were bloodshot red  . Latex Itching    Pt. Stated  Latex was  indicated during hypersensitivity panel  . Tylenol [Acetaminophen]     Due to liver function per patient  . Latex Rash  . Other Rash    Plastic  . Trazodone Other (See Comments)    Made eyes red     Antimicrobials this admission: Vanc 4/6>> (5/17)  Otho Bellows PharmD [phone] 09/03/2018, 3:10 PM  09/03/2018       1:38 PM

## 2018-09-03 NOTE — Progress Notes (Signed)
Social Work Patient ID: [name] [name], male   DOB: [birth year], 63 y.o.   MRN: [id number] Met with pt to discuss team conference goals supervision-mod/i level goals and discharge 5/2. Have reached out to [facility] for IV antibiotics and therapy follow up. Pt has a mid-line already. He voiced his brother will come and stay with him to assist. Has all equipment but did have a power chair eval today. Work toward discharge Sat.

## 2018-09-03 NOTE — Progress Notes (Addendum)
Pharmacy Antibiotic Note  [name] [name] is a 63 y.o. male [facility]. He is on vancomycin for MRSE endocarditis. Pharmacy has been consulted for vancomycin dosing. Last day of therapy is noted as 5/17  -prior [facility] dose at [facility]: 1gm IV q8h (last dose 5:30am on 4/23) -Vancomycin trough on 4/22: 18 -Vancomycin trough on 4/27: 23 -Vancomycin trough ordered 05:00 am 4/29 - resulted 52 mcg/ml, dose was hung at 06:13 before trough drawn 06:45 - repeat Vanc Trough today at 18:29 prior to 18:30 dose came back at 23; this is supratherapeutic.  Plan: Decreased vancomycin to 1250mg  IV q12h Follow-up Scr; vanc trough as needed    Height: 4' (121.9 cm) Weight: 178 lb 9.2 oz (81 kg) IBW/kg (Calculated) : 22.4  Temp (24hrs), Avg:97.8 F (36.6 C), Min:97.6 F (36.4 C), Max:98 F (36.7 C)  Recent Labs  Lab 08/29/18 0545  09/01/18 0533 09/03/18 0645 09/03/18 1829  WBC 4.5  --  6.0  --   --   CREATININE 0.82  --  0.86  --   --   VANCOTROUGH  --    < > 23* 52* 23*   < > = values in this interval not displayed.    Estimated Creatinine Clearance: 57.7 mL/min (by C-G formula based on SCr of 0.86 mg/dL).    Allergies  Allergen Reactions  . Penicillins Anaphylaxis    Tolerated cefepime and ceftriaxone  Has patient had a PCN reaction causing immediate rash, facial/tongue/throat swelling, SOB or lightheadedness with hypotension: Yes Has patient had a PCN reaction causing severe rash involving mucus membranes or skin necrosis: No Has patient had a PCN reaction that required hospitalization No Has patient had a PCN reaction occurring within the last 10 years: No If all of the above answers are "NO", then may proceed with Cephalosporin use.  Marland Kitchen Penicillins Swelling    SWELLING REACTION UNSPECIFIED   . Prednisone Anaphylaxis  . Prednisone Swelling    THROAT  . Acetaminophen Other (See Comments)    Affects liver  . Tramadol Nausea Only and Other (See  Comments)     Nausea and eyes were bloodshot red  . Latex Itching    Pt. Stated  Latex was indicated during hypersensitivity panel  . Tylenol [Acetaminophen]     Due to liver function per patient  . Latex Rash  . Other Rash    Plastic  . Trazodone Other (See Comments)    Made eyes red     Antimicrobials this admission: Vanc 4/6>> (5/17)  Thank you for involving pharmacy in this patient's care.  Bradley Ferris, PharmD 09/03/2018 7:33 PM PGY-1 Pharmacy Resident Direct Phone: [phone] Please check [email or web address] for unit-specific pharmacist phone numbers

## 2018-09-03 NOTE — Progress Notes (Signed)
Occupational Therapy Session Note  Patient Details  Name: Cha Press MRN: [id number] Date of Birth: 1955-08-21  Today's Date: 09/03/2018 OT Individual Time: 9628-3662 OT Individual Time Calculation (min): 60 min    Short Term Goals: Week 1:  OT Short Term Goal 1 (Week 1): Pt will complete toilet transfer with supervision OT Short Term Goal 2 (Week 1): Pt will complete toileting tasks with supervision  Skilled Therapeutic Interventions/Progress Updates:    Treatment session with focus on functional mobility, LB dressing, and BUE strengthening to complete functional transfers.  Pt received supine in bed agreeable to therapy session.  Engaged in LB dressing with lateral leans and rolling at bed level.  Pt completed anterior scoot pivot in to w/c with increased time due to decreased endurance.  Engaged in BUE strengthening with 4# and 5# hand weights for strengthening and endurance.  Pt pleased with progress and is anxious to hear d/c date after conference as he reports still weak but ready to be back home as he has been in the hospital a long time.  Therapy Documentation Precautions:  Precautions Precautions: Fall Precaution Comments: bilateral AKA Restrictions Weight Bearing Restrictions: Yes RLE Weight Bearing: Non weight bearing LLE Weight Bearing: Non weight bearing Pain: Pain Assessment Pain Scale: 0-10 Pain Score: 0-No pain   Therapy/Group: Individual Therapy  Rosalio Loud 09/03/2018, 10:41 AM

## 2018-09-04 ENCOUNTER — Inpatient Hospital Stay (HOSPITAL_COMMUNITY): Payer: Medicare Other

## 2018-09-04 ENCOUNTER — Inpatient Hospital Stay (HOSPITAL_COMMUNITY): Payer: Medicare Other | Admitting: Physical Therapy

## 2018-09-04 ENCOUNTER — Inpatient Hospital Stay: Payer: Self-pay

## 2018-09-04 ENCOUNTER — Inpatient Hospital Stay (HOSPITAL_COMMUNITY): Payer: Medicare Other | Admitting: Occupational Therapy

## 2018-09-04 LAB — PROTIME-INR
INR: 2 — ABNORMAL HIGH (ref 0.8–1.2)
Prothrombin Time: 22.2 seconds — ABNORMAL HIGH (ref 11.4–15.2)

## 2018-09-04 LAB — GLUCOSE, CAPILLARY
Glucose-Capillary: 100 mg/dL — ABNORMAL HIGH (ref 70–99)
Glucose-Capillary: 124 mg/dL — ABNORMAL HIGH (ref 70–99)
Glucose-Capillary: 111 mg/dL — ABNORMAL HIGH (ref 70–99)
Glucose-Capillary: 101 mg/dL — ABNORMAL HIGH (ref 70–99)

## 2018-09-04 MED ORDER — APIXABAN 5 MG PO TABS
5.0000 mg | ORAL_TABLET | Freq: Two times a day (BID) | ORAL | Status: DC
  Administered 2018-09-04 – 2018-09-06 (×3): 5 mg via ORAL
  Filled 2018-09-04 (×4): qty 1

## 2018-09-04 MED ORDER — SODIUM CHLORIDE 0.9% FLUSH
10.0000 mL | INTRAVENOUS | Status: DC | PRN
  Administered 2018-09-06: 10 mL
  Filled 2018-09-04: qty 40

## 2018-09-04 MED ORDER — CLONAZEPAM 0.5 MG PO TABS
0.5000 mg | ORAL_TABLET | Freq: Once | ORAL | Status: DC
  Filled 2018-09-04: qty 1

## 2018-09-04 MED ORDER — TRAMADOL HCL 50 MG PO TABS
50.0000 mg | ORAL_TABLET | Freq: Two times a day (BID) | ORAL | Status: DC | PRN
  Administered 2018-09-04 – 2018-09-06 (×3): 50 mg via ORAL
  Filled 2018-09-04 (×3): qty 1

## 2018-09-04 NOTE — Progress Notes (Signed)
Physical Therapy Session Note  Patient Details  Name: [name] [name] MRN: [id number] Date of Birth: [birth year]  Today's Date: 09/04/2018 PT Individual Time: 1445-1530 PT Individual Time Calculation (min): 45 min   Short Term Goals: Week 1:  PT Short Term Goal 1 (Week 1): = LTG  Skilled Therapeutic Interventions/Progress Updates:    Patient received in bed, pleasant and motivated to participate in session. Able to complete bed mobility with S, scooting transfer to chair with S and self-propelled WC to PT gym with S where he was able to transfer to mat table with S. Continued working on functional B UE strength and functional activity tolerance on mat table, patient however with full body tremor which limited session, VSS and RN made aware- patient still able to participate in session. Following strengthening exercises, patient able to transfer back to [facility] with S, self-propelled back to his room with S, and transferred back to bed with S. He was left in bed with all needs met, bed alarm active this afternoon.   Therapy Documentation Precautions:  Precautions Precautions: Fall Precaution Comments: bilateral AKA Restrictions Weight Bearing Restrictions: Yes RLE Weight Bearing: Non weight bearing LLE Weight Bearing: Non weight bearing Pain: Pain Assessment Pain Scale: 0-10 Pain Score: 0-No pain    Therapy/Group: Individual Therapy   Deniece Ree PT, DPT, CBIS  Supplemental Physical Therapist [facility]    Pager [phone] Acute Rehab Office [phone]    09/04/2018, 3:52 PM

## 2018-09-04 NOTE — Progress Notes (Signed)
Peripherally Inserted Central Catheter/Midline Placement  The IV Nurse has discussed with the patient and/or persons authorized to consent for the patient, the purpose of this procedure and the potential benefits and risks involved with this procedure.  The benefits include less needle sticks, lab draws from the catheter, and the patient may be discharged home with the catheter. Risks include, but not limited to, infection, bleeding, blood clot (thrombus formation), and puncture of an artery; nerve damage and irregular heartbeat and possibility to perform a PICC exchange if needed/ordered by physician.  Alternatives to this procedure were also discussed.  Bard Power PICC patient education guide, fact sheet on infection prevention and patient information card has been provided to patient /or left at bedside.    PICC/Midline Placement Documentation  PICC Single Lumen 09/04/18 PICC Left Brachial 50 cm 0 cm (Active)  Indication for Insertion or Continuance of Line Prolonged intravenous therapies;Home intravenous therapies (PICC only) 09/04/2018 10:29 PM  Exposed Catheter (cm) 0 cm 09/04/2018 10:29 PM  Site Assessment Clean;Dry;Intact 09/04/2018 10:29 PM  Line Status Flushed;Saline locked;Blood return noted 09/04/2018 10:29 PM  Dressing Type Transparent 09/04/2018 10:29 PM  Dressing Status Clean;Dry;Intact 09/04/2018 10:29 PM  Dressing Intervention New dressing 09/04/2018 10:29 PM  Dressing Change Due 09/11/18 09/04/2018 10:29 PM       Tigerlily Christine, Lajean Manes 09/04/2018, 10:30 PM

## 2018-09-04 NOTE — Progress Notes (Signed)
Physical Therapy Session Note  Patient Details  Name: [name] [name] MRN: [id number] Date of Birth: [birth year]  Today's Date: 09/04/2018 PT Individual Time: 0830-0924 PT Individual Time Calculation (min): 54 min   Short Term Goals: Week 1:  PT Short Term Goal 1 (Week 1): = LTG  Skilled Therapeutic Interventions/Progress Updates:    pt rec'd in bed and agreeable to therapy.  Bed mobility with mod I with use of bed rails.  Transfers A/P throughout session with supervision assist.  Sitting balance training with ball tap with 4# dowel 3 x 3 minutes with no LOB.  UE and core strengthening with 3kg medicine ball with pt able to perform therex with frequent rests due to UE fatigue.  Pt AROM and strength assessed to assist with power w/c eval, pt with shoulder ROM deficits due to history of rotator cuff tear.  Pt left in w/c with all needs at hand.  Therapy Documentation Precautions:  Precautions Precautions: Fall Precaution Comments: bilateral AKA Restrictions Weight Bearing Restrictions: Yes RLE Weight Bearing: Non weight bearing LLE Weight Bearing: Non weight bearing Pain:  no c/o pain   Therapy/Group: Individual Therapy  Cipriano Millikan 09/04/2018, 9:17 AM

## 2018-09-04 NOTE — Progress Notes (Signed)
Occupational Therapy Session Note  Patient Details  Name: [name] [name] MRN: [id number] Date of Birth: 11-30-1955  Today's Date: 09/04/2018 OT Individual Time: 1345-1430 OT Individual Time Calculation (min): 45 min    Short Term Goals: Week 1:  OT Short Term Goal 1 (Week 1): Pt will complete toilet transfer with supervision OT Short Term Goal 2 (Week 1): Pt will complete toileting tasks with supervision  Skilled Therapeutic Interventions/Progress Updates:    Treatment session with focus on ADL retraining with bathing at shower level.  Pt received upright in bed.  Completed anterior scoot pivot bed > w/c > tub bench in room shower with setup assist and supervision during transfers.  Pt completed bathing with lateral leans with intermittent supervision.  LB dressing completed with lateral leans when on tub bench when undressing and then at bed level when donning pants.  Pt reports brother is able to provide supervision as needed.  Notified RN of c/o pain in abdomen and buttocks.  Therapy Documentation Precautions:  Precautions Precautions: Fall Precaution Comments: bilateral AKA Restrictions Weight Bearing Restrictions: Yes RLE Weight Bearing: Non weight bearing LLE Weight Bearing: Non weight bearing General:   Vital Signs: Therapy Vitals Pulse Rate: 74 BP: 120/76 Pain:  Pt with c/o pain in abdomen at PEG tub site.  RN aware   Therapy/Group: Individual Therapy  Rosalio Loud 09/04/2018, 3:17 PM

## 2018-09-04 NOTE — Progress Notes (Signed)
PHARMACY CONSULT NOTE FOR:  OUTPATIENT  PARENTERAL ANTIBIOTIC THERAPY (OPAT)  Indication: Endocarditis Regimen: Vancomycin 1250mg  q12h End date: 09/21/2018  IV antibiotic discharge orders are pended. To discharging provider:  please sign these orders via discharge navigator,  Select New Orders & click on the button choice - Manage This Unsigned Work.    Thank you for allowing pharmacy to be a part of this patient's care.  Wendelyn Breslow, PharmD PGY1 Pharmacy Resident Phone: [phone] 09/04/2018 4:49 PM

## 2018-09-04 NOTE — Plan of Care (Signed)
Nutrition Education Note  RD consulted for nutrition education regarding diabetes.  Spoke with pt via phone call to room. Pt very appreciate of RD's phone call.  Pt reports that he does not eat pork, gravy, white bread, or noodles. Pt also states that he eats potatoes but only in moderation. Pt reports that he consumes mostly water but that "once in a while" he will drink a soda but usually doesn't consume the entire drink.  Pt reports that when he is eating right, "he has no blood sugars and no sugar diabetes." Discussed pt's most recent hemoglobin A1C and what that means. Pt reports that he does not have "full-blown" diabetes and that he plans to eat right so he doesn't get it.  Pt reports "I watch my food." Pt able to list some meal ideas he has prepared for when he goes home which includes steak with fat cut off stewed in water with carrots and broccoli. Pt reports he plans to put these in containers and freeze some and refrigerate some so that he will have food to eat for several days.  Lab Results  Component Value Date   HGBA1C 7.8 (H) 09/01/2018    RD attached "Carbohydrate Counting for People with Diabetes" handout from the Academy of [employer] Dietetics to pt's discharge summary. Discussed different food groups and their effects on blood sugar, emphasizing carbohydrate-containing foods. Provided list of carbohydrates and recommended serving sizes of common foods.  Discussed importance of controlled and consistent carbohydrate intake throughout the day. Provided examples of ways to balance meals/snacks and encouraged intake of high-fiber, whole grain complex carbohydrates. Teach back method used.  Expect fair to good compliance.  Current diet order is Carbohydrate Modified, patient is consuming approximately 100% of meals at this time. Labs and medications reviewed. No further nutrition interventions warranted at this time. RD contact information provided. If additional nutrition  issues arise, please re-consult RD.   Earma Reading, MS, RD, LDN Inpatient Clinical Dietitian Pager: [phone] Weekend/After Hours: [phone]

## 2018-09-04 NOTE — Progress Notes (Signed)
ANTICOAGULATION CONSULT NOTE  Pharmacy Consult for Apixaban Indication: history afib/RUE DVT  Allergies  Allergen Reactions  . Penicillins Anaphylaxis    Tolerated cefepime and ceftriaxone  Has patient had a PCN reaction causing immediate rash, facial/tongue/throat swelling, SOB or lightheadedness with hypotension: Yes Has patient had a PCN reaction causing severe rash involving mucus membranes or skin necrosis: No Has patient had a PCN reaction that required hospitalization No Has patient had a PCN reaction occurring within the last 10 years: No If all of the above answers are "NO", then may proceed with Cephalosporin use.  Marland Kitchen Penicillins Swelling    SWELLING REACTION UNSPECIFIED   . Prednisone Anaphylaxis  . Prednisone Swelling    THROAT  . Acetaminophen Other (See Comments)    Affects liver  . Tramadol Nausea Only and Other (See Comments)     Nausea and eyes were bloodshot red  . Latex Itching    Pt. Stated  Latex was indicated during hypersensitivity panel  . Tylenol [Acetaminophen]     Due to liver function per patient  . Latex Rash  . Other Rash    Plastic  . Trazodone Other (See Comments)    Made eyes red    Vital Signs: Temp: 98.1 F (36.7 C) (04/30 0549) Temp Source: Oral (04/30 0549) BP: 105/73 (04/30 0549) Pulse Rate: 70 (04/30 1037)  Labs: Recent Labs    09/02/18 0526 09/03/18 0645 09/04/18 0652  LABPROT 31.4* 21.5* 22.2*  INR 3.1* 1.9* 2.0*   Estimated Creatinine Clearance: 57.7 mL/min (by C-G formula based on SCr of 0.86 mg/dL).  Assessment: 63 yo male transferred to Rehab from [employer]. He is on warfarin for history of afib/ RUE DVT. Pharmacy was consulted to dose warfarin.  Today, 09/04/2018 INR remains at 2.0, looks like Rifampin effect on INR has dissipated. RN noted patient eating well, can transition to Apixaban tonight  Prior to transfer to CIR he received 7.5mg  daily at [facility], dose increased on 08/25/18 from 5 mg  daily.  INR trend last several days 2.0>1.3>1.2>1.6 > 2.7>2.6>2.7 > 3.1> 3.1 > 1.9 Rifampin d/c 4/26, may see lower warfarin dosage requirement.  PTA Amiodarone continues, dose reduced from 200mg  > 100mg  bid  PTA regimen per SNF: 7.5mg /d (increased on 4/20 from 5mg /day)  Goal of Therapy:  INR 2-3 Monitor platelets by anticoagulation protocol: Yes   Plan:  - Apixaban 5mg  bid, begin with 2000 dose -Monitor for s/sx of bleeding  Otho Bellows PharmD Clinical Pharmacist [phone] (till 5:30p) Please check AMION for all [facility] Pharmacy phone numbers After 10:00 PM, call Main Pharmacy [phone] 09/04/2018 12:14 PM  1500: received protocol order to change anti-coagulation to Apixaban INR 1.9 this am, patient received 7.5mg  Warfarin this am 4/29  Will check INR in am 4/30, begin Apixaban tomorrow 4/30 or 5/1, dependent on INR  Thank you,  Otho Bellows PharmD [phone] 09/04/2018, 12:14 PM

## 2018-09-04 NOTE — Progress Notes (Addendum)
[place] PHYSICAL MEDICINE & REHABILITATION PROGRESS NOTE  Subjective/Complaints: Patient seen lying in bed this morning.  He states he slept well overnight.  He has questions regarding his antibiotics.   ROS: + Right arm rash, improving.  Denies painful rash, CP, shortness of breath, nausea, vomiting, diarrhea.  Objective: Vital Signs: Blood pressure 105/73, pulse 62, temperature 98.1 F (36.7 C), temperature source Oral, resp. rate 18, height 4' (1.219 m), weight 81 kg, SpO2 95 %. No results found. No results for input(s): WBC, HGB, HCT, PLT in the last 72 hours. No results for input(s): NA, K, CL, CO2, GLUCOSE, BUN, CREATININE, CALCIUM in the last 72 hours.  Physical Exam: BP 105/73 (BP Location: Left Arm)   Pulse 62   Temp 98.1 F (36.7 C) (Oral)   Resp 18   Ht 4' (1.219 m)   Wt 81 kg   SpO2 95%   BMI 54.49 kg/m  Constitutional: No distress . Vital signs reviewed. HENT: Normocephalic.  Atraumatic.   Eyes: EOMI.  No discharge. Cardiovascular: No JVD. Respiratory: Normal effort. GI: Non-distended.  + PEG C/D/I Musc: Bilateral AKA Neurologic: Alert and oriented Motor: Bilateral upper extremities 4+-5/5 proximal and distal, stable Bilateral lower extremities: Hip flexion: 4+-5/5 Skin: Maculopapular rash on right forearm and hand, improving Psych: Normal mood.  Normal behavior.  Assessment/Plan: 1. Functional deficits secondary to debility with history of bilateral AKA which require 3+ hours per day of interdisciplinary therapy in a comprehensive inpatient rehab setting.  Physiatrist is providing close team supervision and 24 hour management of active medical problems listed below.  Physiatrist and rehab team continue to assess barriers to discharge/monitor patient progress toward functional and medical goals  Care Tool:  Bathing    Body parts bathed by patient: Right arm, Left arm, Chest, Abdomen, Front perineal area, Right upper leg, Left upper leg, Buttocks,  Face   Body parts bathed by helper: Buttocks Body parts n/a: Right lower leg, Left lower leg(B AKA)   Bathing assist Assist Level: Supervision/Verbal cueing     Upper Body Dressing/Undressing Upper body dressing   What is the patient wearing?: Pull over shirt    Upper body assist Assist Level: Supervision/Verbal cueing    Lower Body Dressing/Undressing Lower body dressing      What is the patient wearing?: Pants, Underwear/pull up     Lower body assist Assist for lower body dressing: Minimal Assistance - Patient > 75%(extra time to complete lateral leans and lateral rolls to pull  up underwear and shorts)     Toileting Toileting    Toileting assist Assist for toileting: Supervision/Verbal cueing     Transfers Chair/bed transfer  Transfers assist     Chair/bed transfer assist level: Supervision/Verbal cueing     Locomotion Ambulation   Ambulation assist   Ambulation activity did not occur: N/A          Walk 10 feet activity   Assist           Walk 50 feet activity   Assist           Walk 150 feet activity   Assist           Walk 10 feet on uneven surface  activity   Assist           Wheelchair     Assist Will patient use wheelchair at discharge?: Yes Type of Wheelchair: Manual    Wheelchair assist level: Supervision/Verbal cueing Max wheelchair distance: 164ft     Wheelchair  50 feet with 2 turns activity    Assist        Assist Level: Supervision/Verbal cueing   Wheelchair 150 feet activity     Assist     Assist Level: Supervision/Verbal cueing      Medical Problem List and Plan: 1. Debility secondary to respiratory failure due to pneumonia and exacerbation of CHF  Continue CIR  I saw the patient for evaluation for power wheelchair.  I have reviewed and agree with physical therapy evaluation for power chair.  Has physical and mental capabilities to appropriately operate a power chair.   Patient weighs 81 kg.  Patient requires a power chair at home to allow for mobility, conserve energy, assist with dressing, transferring, cooking, etc. 2. Antithrombotics: -RUE DVT/anticoagulation:Pharmaceutical:Coumadin  INR subtherapeutic on 4/29, labs pending for this morning  CBC within normal limits on 4/27 -antiplatelet therapy: N/A 3. Pain Management:   Topamax change to home dose of 300 nightly on 4/28 (home dose) 4. Mood:LCSW to follow for evaluation and support. -antipsychotic agents: Seroquel and klonopin. 5. Neuropsych: This patient is capable of making decisions on his own behalf. 6. Skin/Wound Care:routine pressure relief measures.  Erythematous rash pt also states intermittent papules, given nailbed changes and prior rx of fungal cream, started on clotrimazole- improving 7. Fluids/Electrolytes/Nutrition:Tolerating regular diet. Monitor I/O.   PEG placed on 3/17 by VIR, notes reviewed, plan to DC tomorrow, n.p.o. after midnight 8. Sepsis/endocarditis: To continue Vanc through 5/17 and Rifampin completed on 4/26  Vanco trough remains elevated on repeat lab night of 4/29  Consulted for PICC placement, discussed with PA 9. COPD: Respiratory status stable on prn nebs.  10 A fib with RVR: Monitor HR bid. Continue amiodarone, digoxin, diltiazem. Medications adjusted due to bradycardia.    Vitals:   09/03/18 1923 09/04/18 0549  BP: 109/71 105/73  Pulse: 68 62  Resp: 20 18  Temp: 98 F (36.7 C) 98.1 F (36.7 C)  SpO2: 94% 95%   Metoprolol decreased to 25 mg twice daily, decreased to 12.5 twice daily on 4/29  Relatively controlled on 4/30  Continue to monitor 11. Neurogenic bladder?: continue flomax.   PVRs remain labile on 4/29, monitor for trend 12. 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    States he was using prosthesis for ambulation until he started developing pressure sores  Will monitor and consider prosthetists evaluation for adjustments if  necessary, not necessary at this point 13. Seizure disorder/chronic HA: Continue topiramate and Elavil.  14. Bipolar disorder: Stable on Seroquel and Klonpin. 15.  Hypokalemia  Potassium 3.9 on 4/27 after supplementation on 4/24, labs ordered for tomorrow 16.  New diagnosis of diabetes mellitus type 2  Hemoglobin A1c 6.2 in 2017, repeat hemoglobin A1c 7.8 on 4/27.  Diet changed to carb modified  SSI ordered  Relatively controlled with diet change  LOS: 7 days A FACE TO FACE EVALUATION WAS PERFORMED  [name] [name] 09/04/2018, 8:21 AM

## 2018-09-04 NOTE — Progress Notes (Signed)
Occupational Therapy Session Note  Patient Details  Name: [name] [name] MRN: [id number] Date of Birth: [birth year]  Today's Date: 09/04/2018 OT Individual Time: 1030-1130 OT Individual Time Calculation (min): 60 min    Short Term Goals: Week 1:  OT Short Term Goal 1 (Week 1): Pt will complete toilet transfer with supervision OT Short Term Goal 2 (Week 1): Pt will complete toileting tasks with supervision  Skilled Therapeutic Interventions/Progress Updates:    Treatment session with focus on functional transfers and BUE strengthening.  Pt received upright in w/c reporting need to toilet.  Pt completed w/c <> toilet transfer with setup and close supervision during transfer.  Pt able to complete hygiene and clothing management with lateral leans with supervision.  Engaged in BUE strengthening with arm bike with pt alternating forward and backward rotations.  Pt reports pleased with increased BUE strength and even notices that transfers are becoming easier and quicker with increased endurance.  Pt transferred back to bed at end of session and left upright with all needs in reach.  Therapy Documentation Precautions:  Precautions Precautions: Fall Precaution Comments: bilateral AKA Restrictions Weight Bearing Restrictions: Yes RLE Weight Bearing: Non weight bearing LLE Weight Bearing: Non weight bearing General:   Vital Signs: Therapy Vitals Pulse Rate: 70 Pain:  Pt with no c/o pain   Therapy/Group: Individual Therapy  Rosalio Loud 09/04/2018, 11:51 AM

## 2018-09-05 ENCOUNTER — Inpatient Hospital Stay (HOSPITAL_COMMUNITY): Payer: Medicare Other | Admitting: Physical Therapy

## 2018-09-05 ENCOUNTER — Inpatient Hospital Stay (HOSPITAL_COMMUNITY): Payer: Medicare Other | Admitting: Occupational Therapy

## 2018-09-05 ENCOUNTER — Inpatient Hospital Stay (HOSPITAL_COMMUNITY): Payer: Medicare Other

## 2018-09-05 DIAGNOSIS — B351 Tinea unguium: Secondary | ICD-10-CM

## 2018-09-05 DIAGNOSIS — R1310 Dysphagia, unspecified: Secondary | ICD-10-CM

## 2018-09-05 DIAGNOSIS — Z931 Gastrostomy status: Secondary | ICD-10-CM

## 2018-09-05 LAB — CBC
HCT: 38.9 % — ABNORMAL LOW (ref 39.0–52.0)
Hemoglobin: 12.7 g/dL — ABNORMAL LOW (ref 13.0–17.0)
MCH: 28.3 pg (ref 26.0–34.0)
MCHC: 32.6 g/dL (ref 30.0–36.0)
MCV: 86.8 fL (ref 80.0–100.0)
Platelets: 199 10*3/uL (ref 150–400)
RBC: 4.48 MIL/uL (ref 4.22–5.81)
RDW: 17 % — ABNORMAL HIGH (ref 11.5–15.5)
WBC: 4.5 10*3/uL (ref 4.0–10.5)
nRBC: 0 % (ref 0.0–0.2)

## 2018-09-05 LAB — GLUCOSE, CAPILLARY
Glucose-Capillary: 128 mg/dL — ABNORMAL HIGH (ref 70–99)
Glucose-Capillary: 105 mg/dL — ABNORMAL HIGH (ref 70–99)
Glucose-Capillary: 114 mg/dL — ABNORMAL HIGH (ref 70–99)
Glucose-Capillary: 133 mg/dL — ABNORMAL HIGH (ref 70–99)

## 2018-09-05 LAB — BASIC METABOLIC PANEL
Anion gap: 9 (ref 5–15)
BUN: 10 mg/dL (ref 8–23)
CO2: 20 mmol/L — ABNORMAL LOW (ref 22–32)
Calcium: 8.8 mg/dL — ABNORMAL LOW (ref 8.9–10.3)
Chloride: 111 mmol/L (ref 98–111)
Creatinine, Ser: 0.71 mg/dL (ref 0.61–1.24)
GFR calc Af Amer: >60 mL/min (ref >60)
GFR calc non Af Amer: >60 mL/min (ref >60)
Glucose, Bld: 136 mg/dL — ABNORMAL HIGH (ref 70–99)
Potassium: 3.3 mmol/L — ABNORMAL LOW (ref 3.5–5.1)
Sodium: 140 mmol/L (ref 135–145)

## 2018-09-05 LAB — DIGOXIN LEVEL: Digoxin Level: 0.3 ng/mL — ABNORMAL LOW (ref 0.8–2.0)

## 2018-09-05 MED ORDER — THIAMINE HCL 100 MG PO TABS: 100.0000 mg | ORAL_TABLET | Freq: Every day | ORAL | 1 refills | Status: AC

## 2018-09-05 MED ORDER — APIXABAN 5 MG PO TABS: 5.0000 mg | ORAL_TABLET | Freq: Two times a day (BID) | ORAL | 1 refills | Status: DC

## 2018-09-05 MED ORDER — POTASSIUM CHLORIDE CRYS ER 10 MEQ PO TBCR: 10.0000 meq | EXTENDED_RELEASE_TABLET | Freq: Every day | ORAL | 1 refills | Status: DC

## 2018-09-05 MED ORDER — VANCOMYCIN HCL 10 G IV SOLR: 1250.0000 mg | Freq: Two times a day (BID) | INTRAVENOUS | 0 refills | Status: DC

## 2018-09-05 MED ORDER — TERBINAFINE HCL 250 MG PO TABS: 250.0000 mg | ORAL_TABLET | Freq: Every day | ORAL | 0 refills | Status: DC

## 2018-09-05 MED ORDER — AMITRIPTYLINE HCL 25 MG PO TABS: 12.5000 mg | ORAL_TABLET | Freq: Every day | ORAL | 0 refills | Status: DC

## 2018-09-05 MED ORDER — SERTRALINE HCL 50 MG PO TABS: 50.0000 mg | ORAL_TABLET | Freq: Every day | ORAL | 1 refills | Status: DC

## 2018-09-05 MED ORDER — POTASSIUM CHLORIDE CRYS ER 10 MEQ PO TBCR
10.0000 meq | EXTENDED_RELEASE_TABLET | Freq: Every day | ORAL | Status: DC
  Administered 2018-09-06: 10 meq via ORAL
  Filled 2018-09-05 (×2): qty 1

## 2018-09-05 MED ORDER — AMIODARONE HCL 100 MG PO TABS: 100.0000 mg | ORAL_TABLET | Freq: Two times a day (BID) | ORAL | 1 refills | Status: DC

## 2018-09-05 MED ORDER — TAMSULOSIN HCL 0.4 MG PO CAPS: 0.4000 mg | ORAL_CAPSULE | Freq: Every day | ORAL | 1 refills | Status: DC

## 2018-09-05 MED ORDER — FOLIC ACID 1 MG PO TABS: 1.0000 mg | ORAL_TABLET | Freq: Every day | ORAL | 1 refills | Status: DC

## 2018-09-05 MED ORDER — QUETIAPINE FUMARATE 50 MG PO TABS: ORAL_TABLET | ORAL | 1 refills | Status: DC

## 2018-09-05 MED ORDER — LIDOCAINE 4 % EX CREA
TOPICAL_CREAM | Freq: Two times a day (BID) | CUTANEOUS | Status: AC
  Administered 2018-09-05 – 2018-09-06 (×2): 1 via TOPICAL
  Filled 2018-09-05: qty 5

## 2018-09-05 MED ORDER — CLONAZEPAM 0.5 MG PO TABS: 0.5000 mg | ORAL_TABLET | Freq: Two times a day (BID) | ORAL | 0 refills | Status: DC

## 2018-09-05 MED ORDER — TRAMADOL HCL 50 MG PO TABS: 50.0000 mg | ORAL_TABLET | Freq: Four times a day (QID) | ORAL | 0 refills | Status: DC | PRN

## 2018-09-05 MED ORDER — BACITRACIN ZINC 500 UNIT/GM EX OINT: TOPICAL_OINTMENT | Freq: Two times a day (BID) | CUTANEOUS | 0 refills | Status: DC

## 2018-09-05 MED ORDER — VANCOMYCIN IV (FOR PTA / DISCHARGE USE ONLY): 1250.0000 mg | Freq: Two times a day (BID) | INTRAVENOUS | 0 refills | Status: AC

## 2018-09-05 MED ORDER — TOPIRAMATE 100 MG PO TABS: 300.0000 mg | ORAL_TABLET | Freq: Every day | ORAL | 1 refills | Status: DC

## 2018-09-05 MED ORDER — METOPROLOL TARTRATE 25 MG PO TABS: 12.5000 mg | ORAL_TABLET | Freq: Three times a day (TID) | ORAL | 1 refills | Status: DC

## 2018-09-05 MED ORDER — TERBINAFINE HCL 250 MG PO TABS
250.0000 mg | ORAL_TABLET | Freq: Every day | ORAL | Status: DC
  Administered 2018-09-06: 250 mg via ORAL
  Filled 2018-09-05 (×3): qty 1

## 2018-09-05 MED ORDER — DIGOXIN 125 MCG PO TABS: 0.1250 mg | ORAL_TABLET | Freq: Every day | ORAL | 0 refills | Status: DC

## 2018-09-05 NOTE — Progress Notes (Signed)
Patient ID: [name] [name], male   DOB: 08-Mar-1956, 63 y.o.   MRN: [id number]   Request made for G tube removal  Placed in IR 07/22/18 by Dr Deanne Coffer IMPRESSION: 1. Technically successful 20 French pull-through gastrostomy placement under fluoroscopy.   Only 6 weeks from placement With G tube 20 Fr--  Recommend keeping in for another 4-6 weeks  We can remove as OP

## 2018-09-05 NOTE — Progress Notes (Addendum)
[name] PHYSICAL MEDICINE & REHABILITATION PROGRESS NOTE  Subjective/Complaints: Patient seen laying in bed this morning.  He states he slept well overnight.  He is questions about his rash.  He had his PICC line placed yesterday.  ROS: + Right arm rash, improving.  Denies painful rash, CP, shortness of breath, nausea, vomiting, diarrhea.  Objective: Vital Signs: Blood pressure 96/62, pulse 68, temperature (!) 97.5 F (36.4 C), temperature source Oral, resp. rate 16, height 4' (1.219 m), weight 80.9 kg, SpO2 96 %. Dg Chest Port 1 View  Result Date: 09/04/2018 CLINICAL DATA:  63 year old male PICC line placement. EXAM: PORTABLE CHEST 1 VIEW COMPARISON:  08/28/2018 and earlier. FINDINGS: Portable AP semi upright view at 2216 hours. Left upper extremity approach PICC line in place. Tip projects about 1 vertebral body below the level of the carina, lower SVC level. Lower lung volumes. Stable cardiac size and mediastinal contours. Crowding of lung markings. No pneumothorax, pleural effusion, pulmonary edema or confluent pulmonary opacity. No acute osseous abnormality identified. Negative visible bowel gas pattern. IMPRESSION: 1. Left upper extremity approach PICC line in place, tip at the lower SVC level. 2. Lower lung volumes. No acute cardiopulmonary abnormality. Electronically Signed   By: Odessa Fleming M.D.   On: 09/04/2018 22:37   [place] Ekg Site Rite  Result Date: 09/04/2018 If Site Rite image not attached, placement could not be confirmed due to current cardiac rhythm.  Recent Labs    09/05/18 0346  WBC 4.5  HGB 12.7*  HCT 38.9*  PLT 199   Recent Labs    09/05/18 0346  NA 140  K 3.3*  CL 111  CO2 20*  GLUCOSE 136*  BUN 10  CREATININE 0.71  CALCIUM 8.8*    Physical Exam: BP 96/62 (BP Location: Left Arm)   Pulse 68   Temp (!) 97.5 F (36.4 C) (Oral)   Resp 16   Ht 4' (1.219 m)   Wt 80.9 kg   SpO2 96%   BMI 54.42 kg/m  Constitutional: No distress . Vital signs  reviewed. HENT: Normocephalic.  Atraumatic.   Eyes: EOMI.  No discharge. Cardiovascular: No JVD. Respiratory: Normal effort. GI: Non-distended.  + PEG C/D/I Musc: Bilateral AKA Neurologic: Alert and oriented Motor: Bilateral upper extremities 4+-5/5 proximal and distal, unchanged Bilateral lower extremities: Hip flexion: 4+-5/5, unchanged Skin: Maculopapular rash on right forearm and hand, improving.  Fungal changes noted in nailbeds of right upper extremity Psych: Normal mood.  Normal behavior.  Assessment/Plan: 1. Functional deficits secondary to debility with history of bilateral AKA which require 3+ hours per day of interdisciplinary therapy in a comprehensive inpatient rehab setting.  Physiatrist is providing close team supervision and 24 hour management of active medical problems listed below.  Physiatrist and rehab team continue to assess barriers to discharge/monitor patient progress toward functional and medical goals  Care Tool:  Bathing    Body parts bathed by patient: Right arm, Left arm, Chest, Abdomen, Front perineal area, Right upper leg, Left upper leg, Buttocks, Face   Body parts bathed by helper: Buttocks Body parts n/a: Right lower leg, Left lower leg(B AKA)   Bathing assist Assist Level: Supervision/Verbal cueing     Upper Body Dressing/Undressing Upper body dressing   What is the patient wearing?: Pull over shirt    Upper body assist Assist Level: Set up assist    Lower Body Dressing/Undressing Lower body dressing      What is the patient wearing?: Pants     Lower  body assist Assist for lower body dressing: Supervision/Verbal cueing     Toileting Toileting    Toileting assist Assist for toileting: Supervision/Verbal cueing     Transfers Chair/bed transfer  Transfers assist     Chair/bed transfer assist level: Supervision/Verbal cueing     Locomotion Ambulation   Ambulation assist   Ambulation activity did not occur: N/A           Walk 10 feet activity   Assist           Walk 50 feet activity   Assist           Walk 150 feet activity   Assist           Walk 10 feet on uneven surface  activity   Assist           Wheelchair     Assist Will patient use wheelchair at discharge?: Yes Type of Wheelchair: Manual    Wheelchair assist level: Supervision/Verbal cueing Max wheelchair distance: 13600ft     Wheelchair 50 feet with 2 turns activity    Assist        Assist Level: Supervision/Verbal cueing   Wheelchair 150 feet activity     Assist     Assist Level: Supervision/Verbal cueing      Medical Problem List and Plan: 1. Debility secondary to respiratory failure due to pneumonia and exacerbation of CHF  Continue CIR  Plan for d/c tomorrow  Will see patient for transitional care management in 1-2 weeks post-discharge 2. Antithrombotics: -RUE DVT/anticoagulation:Pharmaceutical:Coumadin  INR therapeutic on 5/1  CBC within normal limits on 4/27 -antiplatelet therapy: N/A 3. Pain Management:   Topamax change to home dose of 300 nightly on 4/28 (home dose) 4. Mood:LCSW to follow for evaluation and support. -antipsychotic agents: Seroquel and klonopin. 5. Neuropsych: This patient is capable of making decisions on his own behalf. 6. Skin/Wound Care:routine pressure relief measures.  Erythematous rash pt also states intermittent papules, given nailbed changes and prior rx of fungal cream, started on clotrimazole, clotrimazole DC'd on 5/1 and started on oral antifungals 7. Fluids/Electrolytes/Nutrition:Tolerating regular diet. Monitor I/O.   PEG placed on 3/17 by VIR, notes reviewed, patient made n.p.o. attempt to remove PEG this morning.  Patient prepped, attempted to remove PEG this a.m. under traction, however patient could not tolerate traction.  Will consult with VIR 8. Sepsis/endocarditis: To continue Vanc through 5/17 and  Rifampin completed on 4/26  Vanco trough remains elevated on repeat lab night of 4/29  Discussed with PA, prolonged discussion with ID regarding previous antibiotics and course and length of therapy. 9. COPD: Respiratory status stable on prn nebs.  10 A fib with RVR: Monitor HR bid. Continue amiodarone, digoxin, diltiazem. Medications adjusted due to bradycardia.    Vitals:   09/04/18 1939 09/05/18 0401  BP: 105/74 96/62  Pulse: 70 68  Resp: 14 16  Temp: 98.4 F (36.9 C) (!) 97.5 F (36.4 C)  SpO2: 98% 96%   Metoprolol decreased to 25 mg twice daily, decreased to 12.5 twice daily on 4/29  Slightly hypotensive on 5/1  Continue to monitor 11. Neurogenic bladder?: continue flomax.   PVRs remain labile on 4/29, monitor for trend 12. 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    States he was using prosthesis for ambulation until he started developing pressure sores  Will monitor and consider prosthetists evaluation for adjustments if necessary, not necessary at this point 13. Seizure disorder/chronic HA: Continue topiramate and Elavil.  14. Bipolar disorder: Stable  on Seroquel and Klonpin. 15.  Hypokalemia  Potassium 3.3 on 5/1  Daily supplementation initiated on 5/1 16.  New diagnosis of diabetes mellitus type 2  Hemoglobin A1c 6.2 in 2017, repeat hemoglobin A1c 7.8 on 4/27.  Diet changed to carb modified  SSI ordered  Slightly labile on 5/1 17.  Onychomycosis  Terbinafine started on 5/1 x 6 weeks  LOS: 8 days A FACE TO FACE EVALUATION WAS PERFORMED  [name] [name] Karis Juba 09/05/2018, 9:11 AM

## 2018-09-05 NOTE — Discharge Instructions (Signed)
Inpatient Rehab Discharge Instructions  [name] [name] Discharge date and time:  09/06/18  Activities/Precautions/ Functional Status: Activity: no lifting, driving, or strenuous exercise till cleared by MD Diet: diabetic diet Wound Care: Wash area around PEG with soap and water. Pat dry. Keep clean and dry.  Flush PEG with 100 cc distilled water twice a day  Functional status:  ___ No restrictions    ___ Walk up steps independently ___ 24/7 supervision/assistance   ___ Walk up steps with assistance _X__ Intermittent supervision/assistance ___ Bathe/dress independently ___ Walk with walker    _X__ Bathe/dress with assistance ___ Walk Independently    ___ Shower independently ___ Walk with assistance    ___ Shower with assistance _X__ No alcohol     ___ Return to work/school ________   Special Instructions: 1. NO showers while for PICC line in place. Antibiotics to continue thorough  2. Absolutely no alcohol. 3. Monitor sweet and starches due to new onset diabetes.      COMMUNITY REFERRALS UPON DISCHARGE:    Home Health:   PT & RN    Agency:[facility]  Phone:[phone]   Date of last service:09/06/2018   Medical Equipment/Items Ordered:NO NEEDS HAS ALL EQUIPMENT-POWER CHAIR EVALUATION DONE WHILE HERE     GENERAL COMMUNITY RESOURCES FOR PATIENT/FAMILY: Support Groups:[facility] THE SECOND Thursday OF EACH MONTH @ 7;00-8:30 PM ON THE [facility] ROOM [id number] QUESTIONS Lamar Benes [phone]  My questions have been answered and I understand these instructions. I will adhere to these goals and the provided educational materials after my discharge from the hospital.  Patient/Caregiver Signature _______________________________ Date __________  Clinician Signature _______________________________________ Date __________  Please bring this form and your medication list with you to all your follow-up doctor's appointments.             Carbohydrate Counting for People with Diabetes  Why Is Carbohydrate Counting Important? - Counting carbohydrate servings may help you control your blood glucose level so that you feel better. - The balance between the carbohydrates you eat and insulin determines what your blood glucose level will be after eating. - Carbohydrate counting can also help you plan your meals.  Which Foods Have Carbohydrates? Foods with carbohydrates include: - Breads, crackers, and cereals - Pasta, rice, and grains - Starchy vegetables, such as potatoes, corn, and peas - Beans and legumes - Milk, soy milk, and yogurt - Fruits and fruit juices - Sweets, such as cakes, cookies, ice cream, jam, and jelly  Carbohydrate Servings In diabetes meal planning, 1 serving of a food with carbohydrate has about 15 grams of carbohydrate: - Check serving sizes with measuring cups and spoons or a food scale. - Read the Nutrition Facts on food labels to find out how many grams of carbohydrate are in foods you eat. The food lists in this handout show portions that have about 15 grams of carbohydrate.   Meal Planning Tips - An Eating Plan tells you how many carbohydrate servings to eat at your meals and snacks. For many adults, eating 3 to 5 servings of carbohydrate foods at each meal and 1 or 2 carbohydrate servings for each snack works well. - In a healthy daily Eating Plan, most carbohydrates come from: ? At least 6 servings of fruits and nonstarchy vegetables ? At least 6 servings of grains, beans, and starchy vegetables, with at least 3 servings from whole grains ? At least 2 servings of milk or milk products - Check your blood glucose level  regularly. It can tell you if you need to adjust when you eat carbohydrates. -Eating foods that have fiber, such as whole grains, and having very few salty foods is good for your health. - Eat 4 to 6 ounces of meat or other protein foods (such as soybean  burgers) each day. Choose low-fat sources of protein, such as lean beef, lean pork, chicken, fish, low-fat cheese, or vegetarian foods such as soy. - Eat some healthy fats, such as olive oil, canola oil, and nuts. - Eat very little saturated fats. These unhealthy fats are found in butter, cream, and high-fat meats, such as bacon and sausage. - Eat very little or no trans fats. These unhealthy fats are found in all foods that list partially hydrogenated oil as an ingredient.  Label Reading Tips The Nutrition Facts panel on a label lists the grams of total carbohydrate in1 standard serving. The labels standard serving may be larger or smaller than 1 carbohydrate serving. To figure out how many carbohydrate servings are in the food: - First, look at the labels standard serving size. - Check the grams of total carbohydrate. This is the amount of carbohydrate in 1 standard serving. - Divide the grams of total carbohydrate by 15. This number equals the number of carbohydrate servings in 1 standard serving. Remember: 1 carbohydrate serving is 15 grams of carbohydrate. - Note: You may ignore the grams of sugars on the Nutrition Facts panel because they are included in the grams of total carbohydrate.      Foods Recommended 1 serving = about 15 grams of carbohydrate  Starches  1 slice bread (1 ounce)  1 tortilla (6-inch size)   large bagel (1 ounce)  2 taco shells (5-inch size)   hamburger or hot dog bun ( ounce)   cup ready-to-eat unsweetened cereal   cup cooked cereal  1 cup broth-based soup  4 to 6 small crackers  1/3 cup pasta or rice (cooked)   cup beans, peas, corn, sweet potatoes, winter squash, or mashed or boiled potatoes (cooked)   large baked potato (3 ounces)   ounce pretzels, potato chips, or tortilla chips  3 cups popcorn (popped)  Fruit  1 small fresh fruit ( to 1 cup)   cup canned or frozen fruit  2 tablespoons dried fruit (blueberries,  cherries, cranberries, mixed fruit, raisins)  17 small grapes (3 ounces)  1 cup melon or berries   cup unsweetened fruit juice  Milk  1 cup fat-free or reduced-fat milk  1 cup soy milk  2/3 cup (6 ounces) nonfat yogurt sweetened with sugar-free sweetener  Sweets and Desserts  2-inch square cake (unfrosted)  2 small cookies (2/3 ounce)   cup ice cream or frozen yogurt   cup sherbet or sorbet  1 tablespoon syrup, jam, jelly, table sugar, or honey  2 tablespoons light syrup  Other Foods  Count 1 cup raw vegetables or  cup cooked nonstarchy vegetables as zero (0) carbohydrate servings or free foods. If you eat 3 or more servings at one meal, count them as 1 carbohydrate serving.  Foods that have less than 20 calories in each serving also may be counted as zero carbohydrate servings or free foods.  Count 1 cup of casserole or other mixed foods as 2 carbohydrate servings.     Carbohydrate Counting for People with Diabetes Sample 1-Day Menu  Breakfast 1 extra-small banana (1 carbohydrate serving) 1 cup low-fat or fat-free milk (1 carbohydrate serving) 1 slice whole wheat bread (1  carbohydrate serving) 1 teaspoon margarine  Lunch 2 ounces [place] slices 2 slices whole wheat bread (2 carbohydrate servings) 2 lettuce leaves 4 celery sticks 4 carrot sticks 1 medium apple (1 carbohydrate serving) 1 cup low-fat or fat-free milk (1 carbohydrate serving)  Afternoon Snack 2 tablespoons raisins (1 carbohydrate serving) 3/4 ounce unsalted mini pretzels (1 carbohydrate serving)  Evening Meal 3 ounces lean roast beef  1/2 large baked potato (2 carbohydrate servings) 1 tablespoon reduced-fat sour cream 1/2 cup green beans 1 tablespoon light salad dressing 1 whole wheat dinner roll (1 carbohydrate serving) 1 teaspoon margarine 1 cup melon balls (1 carbohydrate serving)  Evening Snack 2 tablespoons unsalted nuts

## 2018-09-05 NOTE — Progress Notes (Signed)
Occupational Therapy Session Note  Patient Details  Name: [name] [name] MRN: [id number] Date of Birth: 1956/01/04  Today's Date: 09/05/2018 OT Individual Time: 9767-3419 OT Individual Time Calculation (min): 60 min  and Today's Date: 09/05/2018 OT Missed Time: 15 Minutes Missed Time Reason: Other (comment)(MD in to remove PEG)   Short Term Goals: Week 1:  OT Short Term Goal 1 (Week 1): Pt will complete toilet transfer with supervision OT Short Term Goal 2 (Week 1): Pt will complete toileting tasks with supervision  Skilled Therapeutic Interventions/Progress Updates:    Treatment session with limited participation due to pt c/o pain at new PICC site and abdomen awaiting PEG removal.  Engaged in discussion regarding d/c plan and preparation with pt again stating that his brother will be able to provide supervision.  Reiterated strength and endurance handouts provided by this OT and primary PT to continue to engaged in strengthening for transfers and self-care tasks.  Engaged in RUE strengthening with 5# hand weight in sitting at EOB and deferred any exercise with LUE due to pain/tenderness from PICC.  RN notified of pain complaints.  Pt requested to rest until MD arrival for PEG removal.  MD arriving as therapist exiting.  Therapy Documentation Precautions:  Precautions Precautions: Fall Precaution Comments: bilateral AKA Restrictions Weight Bearing Restrictions: Yes RLE Weight Bearing: Non weight bearing LLE Weight Bearing: Non weight bearing General: General OT Amount of Missed Time: 15 Minutes Vital Signs:  Pain: Pain Assessment Pain Scale: 0-10 Pain Score: 7  Pain Type: Acute pain Pain Location: Generalized Pain Descriptors / Indicators: Aching;Sore Pain Intervention(s): Medication (See eMAR)   Therapy/Group: Individual Therapy  Rosalio Loud 09/05/2018, 9:41 AM

## 2018-09-05 NOTE — Progress Notes (Signed)
Physical Therapy Session Note  Patient Details  Name: Printes Lutsky MRN: [id number] Date of Birth: [birth year]  Today's Date: 09/05/2018 PT Individual Time: 0925-0950 PT Individual Time Calculation (min): 25 min   Short Term Goals: Week 1:  PT Short Term Goal 1 (Week 1): = LTG  Skilled Therapeutic Interventions/Progress Updates:    pt in pain but is agreeable to attempt bed <> w/c transfers. Pt performs bed mobility with mod I use of bedrails, bed <> w/c transfers to w/c and back to bed with mod I.  Pt left in bed with needs at hand, RN aware of pain.  Therapy Documentation Precautions:  Precautions Precautions: Fall Precaution Comments: bilateral AKA Restrictions Weight Bearing Restrictions: Yes RLE Weight Bearing: Non weight bearing LLE Weight Bearing: Non weight bearing General: PT Amount of Missed Time (min): 50 Minutes PT Missed Treatment Reason: Pain(PICC line placement) Pain: Pt c/o pain at peg tube site and picc line site, RN made aware    Therapy/Group: Individual Therapy  Elnita Surprenant 09/05/2018, 9:53 AM

## 2018-09-05 NOTE — Progress Notes (Signed)
Occupational Therapy Session Note  Patient Details  Name: [name] [name] MRN: [id number] Date of Birth: February 16, 1956  Today's Date: 09/05/2018 OT Individual Time: 1300-1330 OT Individual Time Calculation (min): 30 min    Short Term Goals: Week 1:  OT Short Term Goal 1 (Week 1): Pt will complete toilet transfer with supervision OT Short Term Goal 2 (Week 1): Pt will complete toileting tasks with supervision  Skilled Therapeutic Interventions/Progress Updates:    Patient in bed upon arrival.  He is agreeable to therapy session.  He states that his left arm is sore due to IV site.  A/P transfers completed to/from bed, w/c and toilet at supervision level with increased time.  Patient able to set up w/c and perform transfers in a safe manner.  Reviewed center of gravity and weight shifts when seated in w/c.  Patient demonstrates a good understanding and safe techniques.  Patient returned to bed in side lying position for pressure relief.  Bed alarm set and needs within reach.    Therapy Documentation Precautions:  Precautions Precautions: Fall Precaution Comments: bilateral AKA Restrictions Weight Bearing Restrictions: Yes RLE Weight Bearing: Non weight bearing LLE Weight Bearing: Non weight bearing General:   Vital Signs:  Pain: Pain Assessment Pain Scale: 0-10 Pain Score: 2  Pain Type: Acute pain Pain Location: Abdomen Pain Orientation: Mid Pain Descriptors / Indicators: Aching Pain Intervention(s): RN made aware Other Treatments:     Therapy/Group: Individual Therapy  Barrie Lyme 09/05/2018, 2:04 PM

## 2018-09-05 NOTE — Progress Notes (Signed)
Occupational Therapy Discharge Summary  Patient Details  Name: [name] [name] MRN: [id number] Date of Birth: 11/26/1955  Patient has met 7 of 7 long term goals due to improved activity tolerance, improved balance, ability to compensate for deficits and improved awareness.  Patient to discharge at overall Supervision level.  Patient's care partner is independent to provide the necessary intermittent and setup  assistance at discharge.    Reasons goals not met: N/A  Recommendation:  Patient will benefit from ongoing skilled OT services in home health setting to continue to advance functional skills in the area of BADL and Reduce care partner burden.  Equipment: No equipment provided  Reasons for discharge: treatment goals met and discharge from hospital  Patient/family agrees with progress made and goals achieved: Yes  OT Discharge Precautions/Restrictions  Precautions Precautions: Fall Precaution Comments: bilateral AKA Restrictions Weight Bearing Restrictions: Yes RLE Weight Bearing: Non weight bearing LLE Weight Bearing: Non weight bearing General   Vital Signs Therapy Vitals Temp: 98 F (36.7 C) Temp Source: Oral Pulse Rate: 61 Resp: 18 BP: 116/75 Patient Position (if appropriate): Sitting Oxygen Therapy SpO2: 99 % O2 Device: Room Air Pain Pain Assessment Pain Scale: 0-10 Pain Score: 2  Pain Type: Acute pain Pain Location: Abdomen Pain Orientation: Mid Pain Descriptors / Indicators: Aching Pain Intervention(s): RN made aware ADL ADL Eating: Independent Grooming: Independent Upper Body Bathing: Setup Where Assessed-Upper Body Bathing: Shower Lower Body Bathing: Supervision/safety Where Assessed-Lower Body Bathing: Shower Upper Body Dressing: Setup Where Assessed-Upper Body Dressing: Edge of bed Lower Body Dressing: Supervision/safety Where Assessed-Lower Body Dressing: Bed level Toileting: Supervision/safety Where Assessed-Toileting: Toilet Toilet  Transfer: Distant supervision Toilet Transfer Method: Sit pivot Toilet Transfer Equipment: Grab bars Walk-In Shower Transfer: Close supervision Walk-In Shower Transfer Method: Sit pivot Walk-In Shower Equipment: Transfer tub bench, Grab bars Vision Baseline Vision/History: Wears glasses Wears Glasses: Reading only Patient Visual Report: No change from baseline Vision Assessment?: No apparent visual deficits Cognition Overall Cognitive Status: Within Functional Limits for tasks assessed Arousal/Alertness: Awake/alert Orientation Level: Oriented X4 Attention: Selective Selective Attention: Appears intact Awareness: Impaired Awareness Impairment: Anticipatory impairment Safety/Judgment: Appears intact Sensation Sensation Light Touch: Appears Intact Proprioception: Appears Intact Coordination Gross Motor Movements are Fluid and Coordinated: Yes Fine Motor Movements are Fluid and Coordinated: Yes Motor  Motor Motor - Discharge Observations: improving strength Mobility  Bed Mobility Rolling Right: Independent with assistive device Rolling Left: Independent with assistive device Left Sidelying to Sit: Independent with assistive device  Trunk/Postural Assessment  Cervical Assessment Cervical Assessment: Within Functional Limits Thoracic Assessment Thoracic Assessment: (rounded shoulders) Lumbar Assessment Lumbar Assessment: (posterior pelvic tilt) Postural Control Postural Control: Within Functional Limits  Balance Dynamic Sitting Balance Sitting balance - Comments: independent Extremity/Trunk Assessment RUE Assessment RUE Assessment: Within Functional Limits General Strength Comments: 4/5 LUE Assessment LUE Assessment: Within Functional Limits(pain in LUE this date due to new PICC) General Strength Comments: 4/5   ,  09/05/2018, 3:31 PM  

## 2018-09-05 NOTE — Progress Notes (Signed)
Physical Therapy Discharge Summary  Patient Details  Name: [name] [name] MRN: [id number] Date of Birth: 11-28-1955  Today's Date: 09/05/2018 PT Individual Time: 1150-1215 PT Individual Time Calculation (min): 25 min   Pt rec'd in bed, still c/o pain at peg tube site, but agreeable to transfers and w/c mobility. Pt able to perform blocked practice of A/P transfers with mod I and w/c mobility with mod I.  Pt left in room with all needs at hand. Pt assures PT that brother is able to perform min A for sliding board transfers in to car tomorrow for d/c.  Patient has met 6 of 6 long term goals due to improved activity tolerance, improved balance, increased strength and ability to compensate for deficits.  Patient to discharge at a wheelchair level Modified Independent.    Reasons goals not met: n/a  Recommendation:  Patient will benefit from ongoing skilled PT services in home health setting to continue to advance safe functional mobility, address ongoing impairments in strength, activity tolerance, and minimize fall risk.  Equipment: power w/c eval performed  Reasons for discharge: treatment goals met and discharge from hospital  Patient/family agrees with progress made and goals achieved: Yes  PT Discharge Precautions/Restrictions Precautions Precautions: Fall Precaution Comments: bilateral AKA Vital Signs  Pain Pain Assessment Pain Type: Acute pain Pain Location: Abdomen Pain Orientation: Mid Pain Descriptors / Indicators: Aching Pain Intervention(s): RN made aware  Cognition Overall Cognitive Status: Within Functional Limits for tasks assessed Arousal/Alertness: Awake/alert Sensation Sensation Light Touch: Appears Intact Proprioception: Appears Intact Coordination Gross Motor Movements are Fluid and Coordinated: Yes Fine Motor Movements are Fluid and Coordinated: Yes Motor  Motor Motor - Discharge Observations: improving strength  Mobility Bed Mobility Rolling  Right: Independent with assistive device Rolling Left: Independent with assistive device Left Sidelying to Sit: Independent with assistive device Transfers Anterior-Posterior Transfer: Independent with assistive device Locomotion  Gait Ambulation: No Gait Gait: No Stairs / Additional Locomotion Stairs: No Wheelchair Mobility Wheelchair Mobility: Yes Wheelchair Assistance: Independent with Camera operator: Both upper extremities Wheelchair Parts Management: Independent Distance: 150  Trunk/Postural Assessment  Cervical Assessment Cervical Assessment: Within Functional Limits Thoracic Assessment Thoracic Assessment: (rounded shoulders) Lumbar Assessment Lumbar Assessment: (posterior pelvic tilt) Postural Control Postural Control: Within Functional Limits  Balance Dynamic Sitting Balance Sitting balance - Comments: independent Extremity Assessment      RLE Assessment General Strength Comments: flexion contracture, limited 15 degrees, grossly 3-/5 LLE Assessment General Strength Comments: hip flexion contracture limited 10 degrees, grossly 3-/5    Kadarrius Yanke 09/05/2018, 12:22 PM

## 2018-09-05 NOTE — Progress Notes (Signed)
Social Work  Discharge Note  The overall goal for the admission was met for: DC SAT 5/2  Discharge location: [place]  Length of Stay: Yes-9 DAYS  Discharge activity level: Yes-SUPERVISION-INDEPENDENT WITH WHEELCHAIR  Home/community participation: Yes  Services provided included: MD, RD, PT, OT, RN, CM, Pharmacy, Neuropsych and SW  Financial Services: Medicare and Medicaid  Follow-up services arranged: Home Health: [facility] and Patient/Family request agency HH: NO PREF, DME: NO NEEDS-HAS ALL FROM PREVIOUS ADMIT  Comments (or additional information):PT DID WELL AND REACHED GOALS QUICKLY AND IS [place]. PICC LINE PLACED 4/30 FOR IV ANTIBIOTICS UNTIL 5/17. BROTHER CAN ASSIST AND WILL. PT CAN SELF DIRECT HIS CARE. POWER CHAIR EVAL COMPLETED WHILE ON REHAB-PT TO FOLLOW UP WITH. TEACHING DONE WITH HIM ON IV ANTIBIOTICS FOR 8 PM DOES THEN [place] WILL BE AT HIS HOME SUN AT 8AM.   Patient/Family verbalized understanding of follow-up arrangements: Yes  Individual responsible for coordination of the follow-up plan: SELF  Confirmed correct DME delivered: Elease Hashimoto 09/05/2018    Elease Hashimoto

## 2018-09-06 LAB — GLUCOSE, CAPILLARY
Glucose-Capillary: 115 mg/dL — ABNORMAL HIGH (ref 70–99)
Glucose-Capillary: 102 mg/dL — ABNORMAL HIGH (ref 70–99)

## 2018-09-06 MED ORDER — HEPARIN SOD (PORK) LOCK FLUSH 100 UNIT/ML IV SOLN
250.0000 [IU] | INTRAVENOUS | Status: AC | PRN
  Administered 2018-09-06: 250 [IU]

## 2018-09-06 NOTE — Progress Notes (Signed)
Patient discharged to home today with picc line flushed and capped by IV team; and peg tube RN instructed care. Discharge instructions done per patient no further questions noted.

## 2018-09-06 NOTE — Progress Notes (Signed)
Pt discharged to home per wheelchair accompanied by NT

## 2018-09-07 DIAGNOSIS — J158 Pneumonia due to other specified bacteria: Secondary | ICD-10-CM | POA: Diagnosis not present

## 2018-09-07 DIAGNOSIS — Z79891 Long term (current) use of opiate analgesic: Secondary | ICD-10-CM | POA: Diagnosis not present

## 2018-09-07 DIAGNOSIS — E119 Type 2 diabetes mellitus without complications: Secondary | ICD-10-CM | POA: Diagnosis not present

## 2018-09-07 DIAGNOSIS — J156 Pneumonia due to other aerobic Gram-negative bacteria: Secondary | ICD-10-CM | POA: Diagnosis not present

## 2018-09-07 DIAGNOSIS — Z87440 Personal history of urinary (tract) infections: Secondary | ICD-10-CM | POA: Diagnosis not present

## 2018-09-07 DIAGNOSIS — Z5181 Encounter for therapeutic drug level monitoring: Secondary | ICD-10-CM | POA: Diagnosis not present

## 2018-09-07 DIAGNOSIS — Z452 Encounter for adjustment and management of vascular access device: Secondary | ICD-10-CM | POA: Diagnosis not present

## 2018-09-07 DIAGNOSIS — Z89512 Acquired absence of left leg below knee: Secondary | ICD-10-CM | POA: Diagnosis not present

## 2018-09-07 DIAGNOSIS — I4891 Unspecified atrial fibrillation: Secondary | ICD-10-CM | POA: Diagnosis not present

## 2018-09-07 DIAGNOSIS — Z89511 Acquired absence of right leg below knee: Secondary | ICD-10-CM | POA: Diagnosis not present

## 2018-09-07 DIAGNOSIS — J440 Chronic obstructive pulmonary disease with acute lower respiratory infection: Secondary | ICD-10-CM | POA: Diagnosis not present

## 2018-09-07 DIAGNOSIS — Z7901 Long term (current) use of anticoagulants: Secondary | ICD-10-CM | POA: Diagnosis not present

## 2018-09-07 DIAGNOSIS — I38 Endocarditis, valve unspecified: Secondary | ICD-10-CM | POA: Diagnosis not present

## 2018-09-07 DIAGNOSIS — I110 Hypertensive heart disease with heart failure: Secondary | ICD-10-CM | POA: Diagnosis not present

## 2018-09-07 DIAGNOSIS — Z792 Long term (current) use of antibiotics: Secondary | ICD-10-CM | POA: Diagnosis not present

## 2018-09-07 DIAGNOSIS — I5021 Acute systolic (congestive) heart failure: Secondary | ICD-10-CM | POA: Diagnosis not present

## 2018-09-09 ENCOUNTER — Telehealth: Payer: Self-pay | Admitting: Family Medicine

## 2018-09-09 ENCOUNTER — Telehealth: Payer: Self-pay | Admitting: *Deleted

## 2018-09-09 NOTE — Telephone Encounter (Signed)
Who ordered the CBC? I imagine they are managing since it is just for my info?

## 2018-09-09 NOTE — Telephone Encounter (Signed)
OK, we can discus then. TY.

## 2018-09-09 NOTE — Telephone Encounter (Signed)
FYI: Critical Lab Report: Neutrophils Absolute 0.4  Gave VO for therapy for BID x2 wks + 2 PRNs to [facility] 05/05

## 2018-09-09 NOTE — Telephone Encounter (Signed)
Spoke w/ Clydie Braun, verbal orders given by Jasmine December.

## 2018-09-09 NOTE — Telephone Encounter (Signed)
Copied from CRM [id number]. Topic: Quick Communication - Home Health Verbal Orders >> Sep 09, 2018 11:20 AM Richarda Blade wrote: Caller/Agency: [facility] Callback Number: [phone] Requesting OT/PT/Skilled Nursing/Social Work/Speech Therapy: Skilled nursing  Frequency: 2 times a week for 2 weeks and 2 PRN visits

## 2018-09-09 NOTE — Telephone Encounter (Signed)
If he is home, we need to check a CBC w diff later this week or early next week. If he is in a facility, someone should be monitoring this. Ty.

## 2018-09-09 NOTE — Discharge Summary (Addendum)
Physician Discharge Summary  Patient ID: [name] [name] MRN: [id number] DOB/AGE: 1955/12/26 63 y.o.  Admit date: 08/28/2018 Discharge date: 09/06/2018  Discharge Diagnoses:  Principal Problem:   Debility Active Problems:   S/P bilateral above knee amputation (HCC)   COPD, severe (HCC)   Chronic atrial fibrillation   Hypokalemia   Acute bacterial endocarditis   Acute deep vein thrombosis (DVT) of right upper extremity (HCC)   Neurogenic bladder   Chronic post-traumatic stress disorder (PTSD)   Diabetes mellitus, new onset (HCC)   PEG (percutaneous endoscopic gastrostomy) status ([place])   Discharged Condition: stable  Significant Diagnostic Studies: Dg Chest Port 1 View  Result Date: 09/04/2018 CLINICAL DATA:  63 year old male PICC line placement. EXAM: PORTABLE CHEST 1 VIEW COMPARISON:  08/28/2018 and earlier. FINDINGS: Portable AP semi upright view at 2216 hours. Left upper extremity approach PICC line in place. Tip projects about 1 vertebral body below the level of the carina, lower SVC level. Lower lung volumes. Stable cardiac size and mediastinal contours. Crowding of lung markings. No pneumothorax, pleural effusion, pulmonary edema or confluent pulmonary opacity. No acute osseous abnormality identified. Negative visible bowel gas pattern. IMPRESSION: 1. Left upper extremity approach PICC line in place, tip at the lower SVC level. 2. Lower lung volumes. No acute cardiopulmonary abnormality. Electronically Signed   By: Genevie Ann M.D.   On: 09/04/2018 22:37   Dg Chest Port 1 View  Result Date: 08/11/2018 CLINICAL DATA:  Shortness of breath EXAM: PORTABLE CHEST 1 VIEW COMPARISON:  August 02, 2018 FINDINGS: Tracheostomy catheter tip is 6.4 cm above the carina. No pneumothorax. No edema consolidation. Heart size and pulmonary vascularity are normal. No adenopathy. There is aortic atherosclerosis. No bone lesions. IMPRESSION: Tracheostomy as described without pneumothorax. No edema or  consolidation. Heart size normal. Aortic Atherosclerosis (ICD10-I70.0). Electronically Signed   By: Lowella Grip III M.D.   On: 08/11/2018 16:10   Dg Chest Port 1v Same Day  Result Date: 08/28/2018 CLINICAL DATA:  PICC line placement. EXAM: PORTABLE CHEST 1 VIEW COMPARISON:  Chest x-ray dated August 11, 2018. FINDINGS: Interval removal of the tracheostomy tube. No PICC line identified. Partially visualized gastrostomy tube. The heart size and mediastinal contours are within normal limits. Normal pulmonary vascularity. No focal consolidation, pleural effusion, or pneumothorax. No acute osseous abnormality. IMPRESSION: 1. No PICC line identified. 2. No active disease. Electronically Signed   By: Titus Dubin M.D.   On: 08/28/2018 16:15   [place] Ekg Site Rite  Result Date: 09/04/2018 If Site Rite image not attached, placement could not be confirmed due to current cardiac rhythm.  [place] Ekg Site Rite  Result Date: 08/30/2018 If [facility] image not attached, placement could not be confirmed due to current cardiac rhythm.   Labs:  Basic Metabolic Panel: BMP Latest Ref Rng & Units 09/05/2018 09/01/2018 08/29/2018  Glucose 70 - 99 mg/dL 136(H) 169(H) 106(H)  BUN 8 - 23 mg/dL _0 Creatinine 0.61 - 1.24 mg/dL 0.71 0.86 0.82  Sodium 135 - 145 mmol/L 140 137 137  Potassium 3.5 - 5.1 mmol/L 3.3(L) 3.9 3.4(L)  Chloride 98 - 111 mmol/L 111 109 110  CO2 22 - 32 mmol/L 20(L) 17(L) 17(L)  Calcium 8.9 - 10.3 mg/dL 8.8(L) 9.0 8.8(L)    CBC: CBC Latest Ref Rng & Units 09/05/2018 09/01/2018 08/29/2018  WBC 4.0 - 10.5 K/uL 4.5 6.0 4.5  Hemoglobin 13.0 - 17.0 g/dL 12.7(L) 14.9 14.1  Hematocrit 39.0 - 52.0 % 38.9(L) 44.4 42.7  Platelets  150 - 400 K/uL 199 200 155    CBG: Recent Labs  Lab 09/05/18 1140 09/05/18 1700 09/05/18 2116 09/06/18 0622 09/06/18 1147  GLUCAP 105* 114* 133* 115* 102*    Brief HPI:   [name] [name] is a 63 year old male with history of COPD, CAF, TBI with seizure disorder,  PTSD, ETOH abuse, B-AKA who was admitted to [facility] on 06/29/18 with you exacerbation of COPD, A. fib with RVR and fluid overload.  Hospital course significant for VDRF as well as development of acute right IJ and subclavian DVT, ascites and leukocytosis.  [facility] difficulty with vent wean and ongoing issues with hypoxia.  His hospital course was significant for recurrent fevers due to UTI and H CAP as well as endocarditis.  Endocarditis as well as A. fib with RVR and severe systolic failure.  He underwent tracheostomy on 3/10 by Dr. Lucia Gaskins and PEG tube placed by Dr. Vernard Gambles on 3/17.   He has had issues with encephalopathy as well as recurrent fevers with SIRS on 4/21 felt to be due to incomplete treatment of endocarditis.  Vancomycin and rifampin was added with recommendations to continue rifampin --end date 04/25 and vancomycin with end date 5/17.  His respiratory status had improved and he was extubated and decannulated without difficulty.  Mentation was improving and he was started on dysphagia 3 diet.  He was showing improvement in activity tolerance but continued to have deficits in mobility and ADLs.  CIR was recommended for follow-up therapy.   ospital Course: [name] [name] was admitted to rehab 08/28/2018 for inpatient therapies to consist of PT, ST and OT at least three hours five days a week. Past admission physiatrist, therapy team and rehab RN have worked together to provide customized collaborative inpatient rehab. He was initially maintained on Coumadin however INR has been labile.  His p.o. intake has been good and renal status is stable he was transitioned to Eliquis bid.  Pharmacy has been assisting in management of vancomycin and dose has been adjusted during the stay.  Blood pressure and heart rate have been monitored on twice daily basis.  Metoprolol was decreased to 25 mg to prevent bradycardia.   Topamax was decreased to home dose and  he has been seizure-free on this.    His mood has been stable on Seroquel and Klonopin.  Serial check of lytes showed hypokalemia which has was supplemented briefly. Follow up BMET showed  recurrent hypokalemia therefore he was started on daily potassium supplement on 5/1. His bladder function has been monitored with PVR checks and he was voiding without difficulty at discharge.  Terbinafine was added on 5/1 for onychomycosis and to continue for 6 weeks.  Hemoglobin A1c was ordered due to evidence of hyperglycemia and was noted to be elevated at 7.8.  Diet was changed to carb modified medium and ac/hs cbg checks showed BS to be relatively controlled on diet alone. Po intake has been good and radiology recommended keeping PEG in for additional 4 weeks to allow for track to mature prior to removal. He has progressed to modified independent to supervision level and will continue to receive follow up HHPT, and [place] [facility] after discharge.    Rehab course: During patient's stay in rehab weekly team conferences were held to monitor patient's progress, set goals and discuss barriers to discharge. At admission, patient required min to mod assist with basic ADL task and min assist with  mobility.  Cognitive evaluation revealed patient with baseline abilities.  He was advanced to regular textures as no overt signs or symptoms of aspiration noted.  Speech therapy signed off as services not needed during his stay. He  has had improvement in activity tolerance, balance, postural control as well as ability to compensate for deficits.  He is able to complete ADL tasks with supervision.  He is able to perform A/P transfers and propel his wheelchair at modified independent level. Family able to provide care needed after discharge.     Disposition: Home  Diet: Carb modified  Special Instructions: 1. PEG tube to stay in for additional 4 week. To contact IVR for removal in the future. 2. To continue  Vancomycin with end-date 5/17.  3. Keep PEG site C/D and flush with 100 cc water daily.   Discharge Instructions    Ambulatory referral to Physical Medicine Rehab   Complete by:  As directed    1-2 weeks follow up appointment   Home infusion instructions [facility] May follow [facility] Dosing Protocol; May administer Cathflo as needed to maintain patency of vascular access device.; Flushing of vascular access device: per [facility] Protocol: 0.9% NaCl pre/post medica...   Complete by:  As directed    Instructions:  May follow [facility] Dosing Protocol   Instructions:  May administer Cathflo as needed to maintain patency of vascular access device.   Instructions:  Flushing of vascular access device: per [facility] Protocol: 0.9% NaCl pre/post medication administration and prn patency; Heparin 100 u/ml, 42m for implanted ports and Heparin 10u/ml, 550mfor all other central venous catheters.   Instructions:  May follow [facility] Anaphylaxis Protocol for First Dose Administration in the home: 0.9% NaCl at 25-50 ml/hr to maintain IV access for protocol meds. Epinephrine 0.3 ml IV/IM PRN and Benadryl 25-50 IV/IM PRN s/s of anaphylaxis.   Instructions:  AdIr[facility]fusion Coordinator (RN) to assist per patient IV care needs in the home PRN.     Allergies as of 09/06/2018      Reactions   Penicillins Anaphylaxis   Tolerated cefepime and ceftriaxone Has patient had a PCN reaction causing immediate rash, facial/tongue/throat swelling, SOB or lightheadedness with hypotension: Yes Has patient had a PCN reaction causing severe rash involving mucus membranes or skin necrosis: No Has patient had a PCN reaction that required hospitalization No Has patient had a PCN reaction occurring within the last 10 years: No If all of the above answers are "NO", then may proceed with Cephalosporin use.   Penicillins Swelling   SWELLING REACTION UNSPECIFIED    Prednisone Anaphylaxis   Prednisone Swelling   THROAT    Acetaminophen Other (See Comments)   Affects liver   Tramadol Nausea Only, Other (See Comments)    Nausea and eyes were bloodshot red   Latex Itching   Pt. Stated  Latex was indicated during hypersensitivity panel   Tylenol [acetaminophen]    Due to liver function per patient   Latex Rash   Other Rash   Plastic   Trazodone Other (See Comments)   Made eyes red       Medication List    STOP taking these medications   Calcium + D3 600-200 MG-UNIT Tabs   diltiazem 30 MG tablet Commonly known as:  CARDIZEM   docusate 50 MG/5ML liquid Commonly known as:  COLACE   feeding supplement (PRO-STAT 64) Liqd   gabapentin 300 MG capsule Commonly known as:  NEURONTIN   glycopyrrolate  1 MG tablet Commonly known as:  ROBINUL   hydrOXYzine 50 MG tablet Commonly known as:  ATARAX/VISTARIL   insulin lispro 100 UNIT/ML injection Commonly known as:  HUMALOG   magnesium oxide 400 MG tablet Commonly known as:  MAG-OX   meloxicam 7.5 MG tablet Commonly known as:  MOBIC   oxyCODONE 5 MG immediate release tablet Commonly known as:  Oxy IR/ROXICODONE   pantoprazole sodium 40 mg/20 mL Pack Commonly known as:  PROTONIX   polyethylene glycol 17 g packet Commonly known as:  MIRALAX / GLYCOLAX   POTASSIUM EFFERVESCENT PO   rifampin 300 MG capsule Commonly known as:  RIFADIN   scopolamine 1 MG/3DAYS Commonly known as:  TRANSDERM-SCOP   sodium polystyrene 15 GM/60ML suspension Commonly known as:  KAYEXALATE   tiZANidine 2 MG tablet Commonly known as:  ZANAFLEX   warfarin 7.5 MG tablet Commonly known as:  COUMADIN     TAKE these medications   amiodarone 100 MG tablet Commonly known as:  PACERONE Take 1 tablet (100 mg total) by mouth 2 (two) times daily. What changed:    medication strength  how much to take   amitriptyline 25 MG tablet Commonly known as:  ELAVIL Take 0.5 tablets (12.5 mg total) by mouth at bedtime. What changed:  how much to take   apixaban 5 MG  Tabs tablet Commonly known as:  ELIQUIS Take 1 tablet (5 mg total) by mouth 2 (two) times daily.   bacitracin ointment Apply topically 2 (two) times daily. To lesions on both hands   clonazePAM 0.5 MG tablet Commonly known as:  KLONOPIN Take 1 tablet (0.5 mg total) by mouth 2 (two) times daily. Notes to patient:  For anxiety   digoxin 0.125 MG tablet Commonly known as:  LANOXIN Take 1 tablet (0.125 mg total) by mouth daily. What changed:  when to take this   folic acid 1 MG tablet Commonly known as:  FOLVITE Take 1 tablet (1 mg total) by mouth daily.   metoprolol tartrate 25 MG tablet Commonly known as:  LOPRESSOR Take 0.5 tablets (12.5 mg total) by mouth every 8 (eight) hours. What changed:    medication strength  how much to take  when to take this   multivitamins ther. w/minerals Tabs tablet Take 1 tablet by mouth daily.   potassium chloride 10 MEQ tablet Commonly known as:  K-DUR Take 1 tablet (10 mEq total) by mouth daily.   QUEtiapine 50 MG tablet Commonly known as:  SEROQUEL Take one pill in morning and two pills at bedtime What changed:    medication strength  how much to take  how to take this  when to take this  additional instructions  Another medication with the same name was removed. Continue taking this medication, and follow the directions you see here.   sertraline 50 MG tablet Commonly known as:  ZOLOFT Take 1 tablet (50 mg total) by mouth daily. FOR ANXIETY   tamsulosin 0.4 MG Caps capsule Commonly known as:  FLOMAX Take 1 capsule (0.4 mg total) by mouth daily after supper. What changed:  when to take this   terbinafine 250 MG tablet Commonly known as:  LAMISIL Take 1 tablet (250 mg total) by mouth daily.   thiamine 100 MG tablet Take 1 tablet (100 mg total) by mouth daily.   topiramate 100 MG tablet Commonly known as:  TOPAMAX Take 3 tablets (300 mg total) by mouth at bedtime. What changed:    how much to take  when to  take this   traMADol 50 MG tablet--Rx # 20 pills.  Commonly known as:  ULTRAM Take 1 tablet (50 mg total) by mouth every 6 (six) hours as needed for severe pain.   vancomycin  IVPB Inject 1,250 mg into the vein every 12 (twelve) hours for 17 days. Indication:  Endocarditis Last Day of Therapy:  09/21/2018 Labs - _0 /01/20 1556         Follow-up Information    Shelda Pal, DO Follow up on 09/12/2018.   Specialty:  Family Medicine Why:  RECEIVE TEXT MESSAGE 11:10 [place] ON THE LINK SENT TO YOU. MD WILL COME ON Contact information: [address] [place] [address] [phone]        Dixie Dials, MD. Call on 09/16/2018.   Specialty:  Cardiology Why:  Appointment at 11:30 am for follow up on A fib and endocarditis Contact information: [place] [address] [phone]        Jamse Arn, MD Follow up.   Specialty:  Physical Medicine and Rehabilitation Why:  Office will call you with follow up appointment Contact information: [place] [address] [phone]           Signed: Bary Leriche 09/09/2018, 11:06 PM Patient seen and examined by me on day of discharge. Delice Lesch, MD, ABPMR

## 2018-09-09 NOTE — Telephone Encounter (Signed)
Spoke to the patient and [facility] came by yesterday and took blood and that is where the results are from. We have no contact info. From [facility] took the call and they would not fax over the results. I called [facility] and was on hold for 5 mins. With them. Attempting to call the main office to get RN taking care of this patient. The patient does have a virtual visit for hospital followup on Friday. (this will need to be a telephone call)

## 2018-09-09 NOTE — Telephone Encounter (Signed)
e

## 2018-09-09 NOTE — Telephone Encounter (Signed)
Unable to get the patient and looks like the hospital ordered?

## 2018-09-10 ENCOUNTER — Telehealth: Payer: Self-pay

## 2018-09-10 NOTE — Telephone Encounter (Signed)
  TRANSITIONAL CARE CALL  Patient name: [name] [name]  DOB:  November 10, 1955      1. Are you/is patient experiencing any problems since coming home? YES - Stated that has a lot of soreness and pain around the feeding tube.  Medication not helping much with pain.  a. Are there any questions regarding any aspect of care? No   2. Are there any questions regarding medications administration/dosing?  No  a. Are meds being taken as prescribed?  No - Pharmacy unable to fill 3 medications, supposed to be calling us about them.     3. Have there been any falls?  No   4. Has Home Health been to the house and/or have they contacted you? Yes - has questions for the provider about the exercises, not wanting to do certain exercises before speaking with provider first.  a. If not, have you tried to contact them? Na  b. Can we help you contact them? Na   5. Are bowels and bladder emptying properly?  Yes  a. Are there any unexpected incontinence issues? Na  b. If applicable, is patient following bowel/bladder programs?  NA   6. Any fevers, problems with breathing, or unexpected pain?  No    7. Are there any skin problems or new areas of breakdown?  No   8. Has the patient/family member arranged specialty MD follow up? (ie, cardiology/neuro)  a. Can we help arrange?  Yes   9. Does the patient need any other services or support that we can help arrange?  No   10. Are caregivers following through as expected in assisting the patient?  No   11. Has the patient quit smoking, drinking alcohol, or using drugs as recommended?  Yes - still smokes   Appointment date/time: 09-17-2018  Arrive time: 10:40am  With: Dr. Valarie [facility]  [address]   [phone]

## 2018-09-10 NOTE — Telephone Encounter (Signed)
This provider placed a call to  [facility] for clarification of medications. They have his Amiodarone, Digoxin an Seroquel ready. Placed a call to Mr. [name] [name] regarding the above he verbalizes understanding. Also reports the pain he's having from the Peg- Tube started " when Dr. Allena Katz pulled on the Peg Tube last week, this provider spoke to [name] [name], IR radiology clinic phone number was given: [phone]. [name] [name] was instructed to call for an  Appointment he verbalizes understanding.

## 2018-09-11 DIAGNOSIS — J158 Pneumonia due to other specified bacteria: Secondary | ICD-10-CM | POA: Diagnosis not present

## 2018-09-11 DIAGNOSIS — I38 Endocarditis, valve unspecified: Secondary | ICD-10-CM | POA: Diagnosis not present

## 2018-09-11 DIAGNOSIS — J156 Pneumonia due to other aerobic Gram-negative bacteria: Secondary | ICD-10-CM | POA: Diagnosis not present

## 2018-09-11 DIAGNOSIS — J440 Chronic obstructive pulmonary disease with acute lower respiratory infection: Secondary | ICD-10-CM | POA: Diagnosis not present

## 2018-09-11 DIAGNOSIS — I110 Hypertensive heart disease with heart failure: Secondary | ICD-10-CM | POA: Diagnosis not present

## 2018-09-11 DIAGNOSIS — Z452 Encounter for adjustment and management of vascular access device: Secondary | ICD-10-CM | POA: Diagnosis not present

## 2018-09-12 ENCOUNTER — Telehealth: Payer: Self-pay | Admitting: Family Medicine

## 2018-09-12 ENCOUNTER — Other Ambulatory Visit: Payer: Self-pay

## 2018-09-12 ENCOUNTER — Ambulatory Visit (INDEPENDENT_AMBULATORY_CARE_PROVIDER_SITE_OTHER): Payer: Medicare Other | Admitting: Family Medicine

## 2018-09-12 ENCOUNTER — Encounter: Payer: Self-pay | Admitting: Family Medicine

## 2018-09-12 DIAGNOSIS — R5381 Other malaise: Secondary | ICD-10-CM | POA: Diagnosis not present

## 2018-09-12 DIAGNOSIS — I82621 Acute embolism and thrombosis of deep veins of right upper extremity: Secondary | ICD-10-CM

## 2018-09-12 DIAGNOSIS — Z931 Gastrostomy status: Secondary | ICD-10-CM | POA: Diagnosis not present
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
## 2018-09-12 NOTE — Telephone Encounter (Signed)
LVM for pt to call and schedule 12 wk follow up appt

## 2018-09-15 DIAGNOSIS — Z452 Encounter for adjustment and management of vascular access device: Secondary | ICD-10-CM | POA: Diagnosis not present

## 2018-09-15 DIAGNOSIS — A419 Sepsis, unspecified organism: Secondary | ICD-10-CM | POA: Diagnosis not present

## 2018-09-15 DIAGNOSIS — J440 Chronic obstructive pulmonary disease with acute lower respiratory infection: Secondary | ICD-10-CM | POA: Diagnosis not present

## 2018-09-15 DIAGNOSIS — J158 Pneumonia due to other specified bacteria: Secondary | ICD-10-CM | POA: Diagnosis not present

## 2018-09-15 DIAGNOSIS — I110 Hypertensive heart disease with heart failure: Secondary | ICD-10-CM | POA: Diagnosis not present

## 2018-09-15 DIAGNOSIS — J156 Pneumonia due to other aerobic Gram-negative bacteria: Secondary | ICD-10-CM | POA: Diagnosis not present

## 2018-09-15 DIAGNOSIS — I38 Endocarditis, valve unspecified: Secondary | ICD-10-CM | POA: Diagnosis not present

## 2018-09-17 ENCOUNTER — Telehealth: Payer: Self-pay | Admitting: Family Medicine

## 2018-09-17 ENCOUNTER — Encounter: Payer: Medicare Other | Attending: Physical Medicine & Rehabilitation | Admitting: Physical Medicine & Rehabilitation

## 2018-09-17 ENCOUNTER — Encounter: Payer: Self-pay | Admitting: Physical Medicine & Rehabilitation

## 2018-09-17 ENCOUNTER — Other Ambulatory Visit: Payer: Self-pay

## 2018-09-17 VITALS — Wt 194.0 lb

## 2018-09-17 DIAGNOSIS — Z89611 Acquired absence of right leg above knee: Secondary | ICD-10-CM

## 2018-09-17 DIAGNOSIS — N319 Neuromuscular dysfunction of bladder, unspecified: Secondary | ICD-10-CM

## 2018-09-17 DIAGNOSIS — Z720 Tobacco use: Secondary | ICD-10-CM

## 2018-09-17 DIAGNOSIS — E119 Type 2 diabetes mellitus without complications: Secondary | ICD-10-CM

## 2018-09-17 DIAGNOSIS — R5381 Other malaise: Secondary | ICD-10-CM

## 2018-09-17 DIAGNOSIS — Z931 Gastrostomy status: Secondary | ICD-10-CM

## 2018-09-17 DIAGNOSIS — Z0279 Encounter for issue of other medical certificate: Secondary | ICD-10-CM
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
## 2018-09-17 NOTE — Telephone Encounter (Signed)
Pt's friend dropped off document for provider to fill out (1 page [facility] document) Pt would like to be called when document ready to pick up at [facility] [phone]. Document put at front office tray under providers name.

## 2018-09-18 ENCOUNTER — Telehealth: Payer: Self-pay

## 2018-09-18 DIAGNOSIS — Z452 Encounter for adjustment and management of vascular access device: Secondary | ICD-10-CM | POA: Diagnosis not present

## 2018-09-18 DIAGNOSIS — A419 Sepsis, unspecified organism: Secondary | ICD-10-CM | POA: Diagnosis not present

## 2018-09-18 DIAGNOSIS — I110 Hypertensive heart disease with heart failure: Secondary | ICD-10-CM | POA: Diagnosis not present

## 2018-09-18 DIAGNOSIS — J156 Pneumonia due to other aerobic Gram-negative bacteria: Secondary | ICD-10-CM | POA: Diagnosis not present

## 2018-09-18 DIAGNOSIS — I38 Endocarditis, valve unspecified: Secondary | ICD-10-CM | POA: Diagnosis not present

## 2018-09-18 DIAGNOSIS — J440 Chronic obstructive pulmonary disease with acute lower respiratory infection: Secondary | ICD-10-CM | POA: Diagnosis not present

## 2018-09-18 DIAGNOSIS — J158 Pneumonia due to other specified bacteria: Secondary | ICD-10-CM | POA: Diagnosis not present

## 2018-09-18 NOTE — Telephone Encounter (Signed)
Patty, RN from [facility] called stating patient will be finishing IV Vancomycin on Sunday and she needs an order to pull the picc line.

## 2018-09-18 NOTE — Telephone Encounter (Signed)
Does he have a follow up with ID?  If not, we can write the order.  Thanks.

## 2018-09-19 ENCOUNTER — Telehealth: Payer: Self-pay

## 2018-09-19 ENCOUNTER — Telehealth: Payer: Self-pay | Admitting: *Deleted

## 2018-09-19 ENCOUNTER — Encounter: Payer: Self-pay | Admitting: Family Medicine

## 2018-09-19 DIAGNOSIS — Z931 Gastrostomy status: Secondary | ICD-10-CM

## 2018-09-19 NOTE — Telephone Encounter (Signed)
Spoke to [place] again to let know per Dr. Allena Katz patient needs to finish antibx doses and to pull the picc line when antibx is done. May have 2 visits next week one to draw labs and change dressing and other to pull the picc line.  Will put an order in for pt to be evaluated by Interventional Radiology for PEG tube.

## 2018-09-19 NOTE — Telephone Encounter (Signed)
See phone call from [facility] left on VM 09/18/18

## 2018-09-19 NOTE — Telephone Encounter (Addendum)
Patient called and left voicemail on 09-17-2018 in regards to wanting the tube removed from his stomach area.  Stated it is the cause of pain and irritation around the site an is wanting it to be removed as soon as possible.  Relayed the message to Dr. Allena Katz who then stated that it needs to have an eval completed by interventional radiology before it can be removed.

## 2018-09-19 NOTE — Addendum Note (Signed)
Addended by: Silas Sacramento T on: 09/19/2018 02:46 PM   Modules accepted: Orders

## 2018-09-19 NOTE — Telephone Encounter (Signed)
error 

## 2018-09-19 NOTE — Telephone Encounter (Signed)
Patty RN [facility] called to get additional SN visits 2wk1. His original abx end date was supposed to be 5/17 but he missed a couple of doses which would mean it would extend to 09/23/18 if he receives them.  They need order to pull PICC and does he need labs before they pull it?  Please advise.

## 2018-09-19 NOTE — Telephone Encounter (Signed)
Discussed with CMAs.  Communication being relayed. p

## 2018-09-22 DIAGNOSIS — Z452 Encounter for adjustment and management of vascular access device: Secondary | ICD-10-CM | POA: Diagnosis not present

## 2018-09-22 DIAGNOSIS — J156 Pneumonia due to other aerobic Gram-negative bacteria: Secondary | ICD-10-CM | POA: Diagnosis not present

## 2018-09-22 DIAGNOSIS — J158 Pneumonia due to other specified bacteria: Secondary | ICD-10-CM | POA: Diagnosis not present

## 2018-09-22 DIAGNOSIS — I38 Endocarditis, valve unspecified: Secondary | ICD-10-CM | POA: Diagnosis not present

## 2018-09-22 DIAGNOSIS — I110 Hypertensive heart disease with heart failure: Secondary | ICD-10-CM | POA: Diagnosis not present

## 2018-09-22 DIAGNOSIS — J440 Chronic obstructive pulmonary disease with acute lower respiratory infection: Secondary | ICD-10-CM | POA: Diagnosis not present

## 2018-09-22 DIAGNOSIS — A419 Sepsis, unspecified organism: Secondary | ICD-10-CM | POA: Diagnosis not present

## 2018-09-23 DIAGNOSIS — I358 Other nonrheumatic aortic valve disorders: Secondary | ICD-10-CM | POA: Diagnosis not present

## 2018-09-23 DIAGNOSIS — I340 Nonrheumatic mitral (valve) insufficiency: Secondary | ICD-10-CM | POA: Diagnosis not present

## 2018-09-23 DIAGNOSIS — I2510 Atherosclerotic heart disease of native coronary artery without angina pectoris: Secondary | ICD-10-CM | POA: Diagnosis not present

## 2018-09-23 DIAGNOSIS — I420 Dilated cardiomyopathy: Secondary | ICD-10-CM | POA: Diagnosis not present

## 2018-09-24 ENCOUNTER — Telehealth: Payer: Self-pay

## 2018-09-24 NOTE — Telephone Encounter (Signed)
I am not sure what to do about the patient's noncompliance with vancomycin.  I can speak with the cardiologist regarding the PICC line and he can determine timeframe for removal.  We have reached out to VIR and request the patient do the same.  Normally I can recommend is to continue to call.  He also needs to establish with a primary care physician.  Thanks.

## 2018-09-24 NOTE — Telephone Encounter (Signed)
Patty RN [facility] called stating that the patient has NOT been in compliance with his vancomycin that he is supposed to be receiving.  In addition the patients Cardiologist (Dr. Algie Coffer [phone]) is also wanting to touch base with Dr. Allena Katz in regards to the patients pick line and the need of the patient receiving blood cultures.  Lastly the patient continues to have issues and problems regarding the patients G-TUBE and has not been seen or heard from by VIR.

## 2018-09-25 DIAGNOSIS — J158 Pneumonia due to other specified bacteria: Secondary | ICD-10-CM | POA: Diagnosis not present

## 2018-09-25 DIAGNOSIS — J156 Pneumonia due to other aerobic Gram-negative bacteria: Secondary | ICD-10-CM | POA: Diagnosis not present

## 2018-09-25 DIAGNOSIS — I38 Endocarditis, valve unspecified: Secondary | ICD-10-CM | POA: Diagnosis not present

## 2018-09-25 DIAGNOSIS — Z452 Encounter for adjustment and management of vascular access device: Secondary | ICD-10-CM | POA: Diagnosis not present

## 2018-09-25 DIAGNOSIS — I110 Hypertensive heart disease with heart failure: Secondary | ICD-10-CM | POA: Diagnosis not present

## 2018-09-25 DIAGNOSIS — J440 Chronic obstructive pulmonary disease with acute lower respiratory infection: Secondary | ICD-10-CM | POA: Diagnosis not present

## 2018-09-25 NOTE — Telephone Encounter (Signed)
Update: Dr. Allena Katz verbalized that he spoke to the cardiologist and confirmed that PICC line can be removed. He is willing to provide the order. He reiterated that patient is extremely non-compliant and will need to follow up with cardiology and establish primary care.  I attempted to cal the Patty, RN, [facility]. Left voice message to call us back

## 2018-09-25 NOTE — Telephone Encounter (Signed)
Spoke to nurse, gave verbal for picc line removal. Informed that IR has an appointment set for 05/26 to have PEG tube removal at [facility]. Urged follow up with cardiology and establishment of PCP care

## 2018-09-26 ENCOUNTER — Telehealth: Payer: Self-pay | Admitting: *Deleted

## 2018-09-26 NOTE — Telephone Encounter (Signed)
Tasia Catchings, PT, [facility] left a message stating patient wishes to be compliant with home health physical therapy. He is asking for a verbal order for initial evaluation followed by 2week4.  Medical record reviewed. Social work note reviewed.  Verbal orders given per office protocol.

## 2018-09-30 ENCOUNTER — Ambulatory Visit (HOSPITAL_COMMUNITY): Admission: RE | Admit: 2018-09-30 | Payer: Medicare Other | Source: Ambulatory Visit

## 2018-10-01 ENCOUNTER — Encounter (HOSPITAL_COMMUNITY): Payer: Self-pay | Admitting: Diagnostic Radiology

## 2018-10-01 ENCOUNTER — Ambulatory Visit (HOSPITAL_COMMUNITY)
Admission: RE | Admit: 2018-10-01 | Discharge: 2018-10-01 | Disposition: A | Discharge status: Home / Self Care | Payer: Medicare Other | Source: Ambulatory Visit | Attending: Physical Medicine & Rehabilitation | Admitting: Physical Medicine & Rehabilitation

## 2018-10-01 ENCOUNTER — Other Ambulatory Visit: Payer: Self-pay

## 2018-10-01 DIAGNOSIS — Z431 Encounter for attention to gastrostomy: Secondary | ICD-10-CM | POA: Diagnosis not present

## 2018-10-01 DIAGNOSIS — K9423 Gastrostomy malfunction: Secondary | ICD-10-CM | POA: Diagnosis not present

## 2018-10-01 DIAGNOSIS — Z931 Gastrostomy status: Secondary | ICD-10-CM

## 2018-10-01 HISTORY — PX: IR GASTROSTOMY TUBE REMOVAL: IMG5492

## 2018-10-01 MED ORDER — LIDOCAINE VISCOUS HCL 2 % MT SOLN
OROMUCOSAL | Status: AC
  Filled 2018-10-01: qty 15

## 2018-10-01 NOTE — Procedures (Signed)
Interventional Radiology Procedure:   Indications: Gastrostomy tube is no longer needed  Procedure: Gastrostomy tube removal  Findings: Complete removal of tube  Complications: None     EBL: Less than 5 ml  Plan: Resume diet.     Adaley Kiene R. Lowella Dandy, MD  Pager: [phone]

## 2018-10-06 DIAGNOSIS — J156 Pneumonia due to other aerobic Gram-negative bacteria: Secondary | ICD-10-CM | POA: Diagnosis not present

## 2018-10-06 DIAGNOSIS — J440 Chronic obstructive pulmonary disease with acute lower respiratory infection: Secondary | ICD-10-CM | POA: Diagnosis not present

## 2018-10-06 DIAGNOSIS — I38 Endocarditis, valve unspecified: Secondary | ICD-10-CM | POA: Diagnosis not present

## 2018-10-06 DIAGNOSIS — I110 Hypertensive heart disease with heart failure: Secondary | ICD-10-CM | POA: Diagnosis not present

## 2018-10-06 DIAGNOSIS — Z452 Encounter for adjustment and management of vascular access device: Secondary | ICD-10-CM | POA: Diagnosis not present

## 2018-10-06 DIAGNOSIS — J158 Pneumonia due to other specified bacteria: Secondary | ICD-10-CM | POA: Diagnosis not present

## 2018-10-07 ENCOUNTER — Telehealth: Payer: Self-pay | Admitting: Physical Medicine & Rehabilitation

## 2018-10-07 DIAGNOSIS — J440 Chronic obstructive pulmonary disease with acute lower respiratory infection: Secondary | ICD-10-CM | POA: Diagnosis not present

## 2018-10-07 DIAGNOSIS — J156 Pneumonia due to other aerobic Gram-negative bacteria: Secondary | ICD-10-CM | POA: Diagnosis not present

## 2018-10-07 DIAGNOSIS — I5021 Acute systolic (congestive) heart failure: Secondary | ICD-10-CM | POA: Diagnosis not present

## 2018-10-07 DIAGNOSIS — Z79891 Long term (current) use of opiate analgesic: Secondary | ICD-10-CM | POA: Diagnosis not present

## 2018-10-07 DIAGNOSIS — I38 Endocarditis, valve unspecified: Secondary | ICD-10-CM | POA: Diagnosis not present

## 2018-10-07 DIAGNOSIS — Z452 Encounter for adjustment and management of vascular access device: Secondary | ICD-10-CM | POA: Diagnosis not present

## 2018-10-07 DIAGNOSIS — I110 Hypertensive heart disease with heart failure: Secondary | ICD-10-CM | POA: Diagnosis not present

## 2018-10-07 DIAGNOSIS — I4891 Unspecified atrial fibrillation: Secondary | ICD-10-CM | POA: Diagnosis not present

## 2018-10-07 DIAGNOSIS — Z792 Long term (current) use of antibiotics: Secondary | ICD-10-CM | POA: Diagnosis not present

## 2018-10-07 DIAGNOSIS — J158 Pneumonia due to other specified bacteria: Secondary | ICD-10-CM | POA: Diagnosis not present

## 2018-10-07 DIAGNOSIS — Z5181 Encounter for therapeutic drug level monitoring: Secondary | ICD-10-CM | POA: Diagnosis not present

## 2018-10-07 DIAGNOSIS — Z89511 Acquired absence of right leg below knee: Secondary | ICD-10-CM | POA: Diagnosis not present

## 2018-10-07 DIAGNOSIS — Z89512 Acquired absence of left leg below knee: Secondary | ICD-10-CM | POA: Diagnosis not present

## 2018-10-07 DIAGNOSIS — Z87440 Personal history of urinary (tract) infections: Secondary | ICD-10-CM | POA: Diagnosis not present

## 2018-10-07 DIAGNOSIS — E119 Type 2 diabetes mellitus without complications: Secondary | ICD-10-CM | POA: Diagnosis not present

## 2018-10-07 DIAGNOSIS — Z7901 Long term (current) use of anticoagulants: Secondary | ICD-10-CM | POA: Diagnosis not present

## 2018-10-07 NOTE — Telephone Encounter (Signed)
We can order. Thanks. 

## 2018-10-07 NOTE — Telephone Encounter (Signed)
Called ptn to confirm in person he states legs are very stiff would like extra PT.

## 2018-10-08 DIAGNOSIS — J158 Pneumonia due to other specified bacteria: Secondary | ICD-10-CM | POA: Diagnosis not present

## 2018-10-08 DIAGNOSIS — Z452 Encounter for adjustment and management of vascular access device: Secondary | ICD-10-CM | POA: Diagnosis not present

## 2018-10-08 DIAGNOSIS — J156 Pneumonia due to other aerobic Gram-negative bacteria: Secondary | ICD-10-CM | POA: Diagnosis not present

## 2018-10-08 DIAGNOSIS — J440 Chronic obstructive pulmonary disease with acute lower respiratory infection: Secondary | ICD-10-CM | POA: Diagnosis not present

## 2018-10-08 DIAGNOSIS — I38 Endocarditis, valve unspecified: Secondary | ICD-10-CM | POA: Diagnosis not present

## 2018-10-08 DIAGNOSIS — I110 Hypertensive heart disease with heart failure: Secondary | ICD-10-CM | POA: Diagnosis not present

## 2018-10-08 NOTE — Telephone Encounter (Signed)
The case worker Nutritional therapist) is requesting updates on the [employer] form. Call back number [phone].  Requesting to see different psychiatrist (preferably male). Needs referral

## 2018-10-08 NOTE — Telephone Encounter (Signed)
I spoke with Tasia Catchings PT [facility].

## 2018-10-09 ENCOUNTER — Encounter: Payer: Medicare Other | Attending: Physical Medicine & Rehabilitation | Admitting: Physical Medicine & Rehabilitation

## 2018-10-09 ENCOUNTER — Other Ambulatory Visit: Payer: Self-pay

## 2018-10-09 ENCOUNTER — Encounter: Payer: Self-pay | Admitting: Physical Medicine & Rehabilitation

## 2018-10-09 VITALS — BP 116/80 | HR 85 | Wt 197.0 lb

## 2018-10-09 DIAGNOSIS — Z931 Gastrostomy status: Secondary | ICD-10-CM | POA: Diagnosis not present

## 2018-10-09 DIAGNOSIS — I330 Acute and subacute infective endocarditis: Secondary | ICD-10-CM | POA: Insufficient documentation

## 2018-10-09 DIAGNOSIS — N319 Neuromuscular dysfunction of bladder, unspecified: Secondary | ICD-10-CM

## 2018-10-09 DIAGNOSIS — I339 Acute and subacute endocarditis, unspecified: Secondary | ICD-10-CM | POA: Insufficient documentation

## 2018-10-09 DIAGNOSIS — F411 Generalized anxiety disorder: Secondary | ICD-10-CM

## 2018-10-09 DIAGNOSIS — R5381 Other malaise: Secondary | ICD-10-CM | POA: Diagnosis not present

## 2018-10-09 DIAGNOSIS — M79609 Pain in unspecified limb: Secondary | ICD-10-CM

## 2018-10-09 DIAGNOSIS — Z720 Tobacco use: Secondary | ICD-10-CM | POA: Diagnosis not present

## 2018-10-09 DIAGNOSIS — T8789 Other complications of amputation stump: Secondary | ICD-10-CM | POA: Diagnosis not present

## 2018-10-09 DIAGNOSIS — E119 Type 2 diabetes mellitus without complications: Secondary | ICD-10-CM | POA: Diagnosis not present

## 2018-10-09 DIAGNOSIS — Z89611 Acquired absence of right leg above knee: Secondary | ICD-10-CM | POA: Diagnosis not present

## 2018-10-09 DIAGNOSIS — I82621 Acute embolism and thrombosis of deep veins of right upper extremity: Secondary | ICD-10-CM | POA: Diagnosis not present

## 2018-10-09 DIAGNOSIS — I482 Chronic atrial fibrillation, unspecified: Secondary | ICD-10-CM | POA: Diagnosis not present

## 2018-10-09 DIAGNOSIS — Z89612 Acquired absence of left leg above knee: Secondary | ICD-10-CM

## 2018-10-09 HISTORY — DX: Acute and subacute endocarditis, unspecified: I33.9
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
## 2018-10-09 NOTE — Telephone Encounter (Signed)
The patient is not going to keep his current psy----would like a referral to behavioral health. The patients wife will pickup cope of FL2 and medication list.

## 2018-10-09 NOTE — Telephone Encounter (Signed)
This was filled out and I believe Jasmine December had faxed it. We called him due to not knowing answers to some of questions, I know we completed it. Ty.

## 2018-10-10 DIAGNOSIS — I38 Endocarditis, valve unspecified: Secondary | ICD-10-CM | POA: Diagnosis not present

## 2018-10-10 DIAGNOSIS — J156 Pneumonia due to other aerobic Gram-negative bacteria: Secondary | ICD-10-CM | POA: Diagnosis not present

## 2018-10-10 DIAGNOSIS — I110 Hypertensive heart disease with heart failure: Secondary | ICD-10-CM | POA: Diagnosis not present

## 2018-10-10 DIAGNOSIS — Z452 Encounter for adjustment and management of vascular access device: Secondary | ICD-10-CM | POA: Diagnosis not present

## 2018-10-10 DIAGNOSIS — J158 Pneumonia due to other specified bacteria: Secondary | ICD-10-CM | POA: Diagnosis not present

## 2018-10-10 DIAGNOSIS — J440 Chronic obstructive pulmonary disease with acute lower respiratory infection: Secondary | ICD-10-CM | POA: Diagnosis not present

## 2018-10-10 NOTE — Telephone Encounter (Signed)
Ms [name] [name] came in office and picked up document filled out by provider, pt was called to confirm ok with pt to give document to Ms Jeanette Caprice, it was approved verbally.(document given to [facility])

## 2018-10-10 NOTE — Telephone Encounter (Signed)
Psychiatry is self referral and they will not accept anything from me. Here are the offices we recommend trying:  [facility] [address] [place], [place] [address] [phone]  [facility] [address] [place], [place] [address] [phone]  [facility] [address] [place], [place] [address] [phone]  [facility] [address], [place], [place], #[phone] [address], [place], [place], #[phone]  [facility] [address] [place], [place] [address] [phone]  [facility] [address] [place], [place] [phone]  [facility] [address] [place], [place] [phone]  Call one of these offices sooner than later as it can take 2-3 months to get a new patient appointment.

## 2018-10-10 NOTE — Telephone Encounter (Signed)
Spoke to the patient and he did ok [name] [name] to pickup Paperwork//FL2///list of Psy. Offices to call to schedule appt. Did confirm with office manager ok to take a verbal from the patient for [name] [name] ok per patient to pickup paperwork.Marland Kitchen

## 2018-10-10 NOTE — Telephone Encounter (Signed)
Called the patient and will include list along with FL2 form at the front desk.

## 2018-10-11 ENCOUNTER — Other Ambulatory Visit: Payer: Self-pay | Admitting: Physical Medicine and Rehabilitation

## 2018-10-13 ENCOUNTER — Ambulatory Visit (INDEPENDENT_AMBULATORY_CARE_PROVIDER_SITE_OTHER): Payer: Medicare Other | Admitting: Family Medicine

## 2018-10-13 ENCOUNTER — Telehealth: Payer: Self-pay | Admitting: Family Medicine

## 2018-10-13 ENCOUNTER — Other Ambulatory Visit: Payer: Self-pay

## 2018-10-13 ENCOUNTER — Encounter: Payer: Self-pay | Admitting: Family Medicine

## 2018-10-13 DIAGNOSIS — F4312 Post-traumatic stress disorder, chronic: Secondary | ICD-10-CM

## 2018-10-13 DIAGNOSIS — M79604 Pain in right leg: Secondary | ICD-10-CM | POA: Diagnosis not present

## 2018-10-13 DIAGNOSIS — G894 Chronic pain syndrome: Secondary | ICD-10-CM | POA: Diagnosis not present

## 2018-10-13 DIAGNOSIS — F209 Schizophrenia, unspecified: Secondary | ICD-10-CM | POA: Diagnosis not present

## 2018-10-13 DIAGNOSIS — M79605 Pain in left leg: Secondary | ICD-10-CM | POA: Diagnosis not present

## 2018-10-13 MED ORDER — CLONAZEPAM 0.5 MG PO TABS: 0.5000 mg | ORAL_TABLET | Freq: Two times a day (BID) | ORAL | 2 refills | Status: DC | PRN

## 2018-10-13 MED ORDER — CLONAZEPAM 0.5 MG PO TABS: 2.0000 mg | ORAL_TABLET | Freq: Two times a day (BID) | ORAL | 2 refills | Status: DC

## 2018-10-13 NOTE — Telephone Encounter (Signed)
Copied from [place] [phone]. Topic: General - Other >> Oct 13, 2018  2:00 PM Pauline Good wrote: Reason for CRM: Lori[facility]  Need to verify  clonazepam prescription dosage and directions. Insurance won't cover .5mg  if they are doing 4 tablets 2x's a day. Please call

## 2018-10-13 NOTE — Telephone Encounter (Signed)
Error corrected, new rx sent. Ty.
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
## 2018-10-13 NOTE — Telephone Encounter (Signed)
Last clinic note states:   10.  Generalized anxiety disorder, Follow-up with psych,    Klonopin 0.2530 ordered-instructed patient this would be a one-time prescription

## 2018-10-14 DIAGNOSIS — I110 Hypertensive heart disease with heart failure: Secondary | ICD-10-CM | POA: Diagnosis not present

## 2018-10-14 DIAGNOSIS — J156 Pneumonia due to other aerobic Gram-negative bacteria: Secondary | ICD-10-CM | POA: Diagnosis not present

## 2018-10-14 DIAGNOSIS — J440 Chronic obstructive pulmonary disease with acute lower respiratory infection: Secondary | ICD-10-CM | POA: Diagnosis not present

## 2018-10-14 DIAGNOSIS — I38 Endocarditis, valve unspecified: Secondary | ICD-10-CM | POA: Diagnosis not present

## 2018-10-14 DIAGNOSIS — J158 Pneumonia due to other specified bacteria: Secondary | ICD-10-CM | POA: Diagnosis not present

## 2018-10-14 DIAGNOSIS — Z452 Encounter for adjustment and management of vascular access device: Secondary | ICD-10-CM | POA: Diagnosis not present

## 2018-10-15 LAB — CULTURE, BLOOD (SINGLE)

## 2018-10-16 DIAGNOSIS — I38 Endocarditis, valve unspecified: Secondary | ICD-10-CM | POA: Diagnosis not present

## 2018-10-16 DIAGNOSIS — I110 Hypertensive heart disease with heart failure: Secondary | ICD-10-CM | POA: Diagnosis not present

## 2018-10-16 DIAGNOSIS — J156 Pneumonia due to other aerobic Gram-negative bacteria: Secondary | ICD-10-CM | POA: Diagnosis not present

## 2018-10-16 DIAGNOSIS — J440 Chronic obstructive pulmonary disease with acute lower respiratory infection: Secondary | ICD-10-CM | POA: Diagnosis not present

## 2018-10-16 DIAGNOSIS — J158 Pneumonia due to other specified bacteria: Secondary | ICD-10-CM | POA: Diagnosis not present

## 2018-10-16 DIAGNOSIS — Z452 Encounter for adjustment and management of vascular access device: Secondary | ICD-10-CM | POA: Diagnosis not present

## 2018-10-17 ENCOUNTER — Other Ambulatory Visit: Payer: Self-pay | Admitting: Physical Medicine and Rehabilitation

## 2018-10-21 ENCOUNTER — Telehealth: Payer: Self-pay | Admitting: Family Medicine

## 2018-10-21 ENCOUNTER — Other Ambulatory Visit: Payer: Self-pay | Admitting: Family Medicine

## 2018-10-21 DIAGNOSIS — J440 Chronic obstructive pulmonary disease with acute lower respiratory infection: Secondary | ICD-10-CM | POA: Diagnosis not present

## 2018-10-21 DIAGNOSIS — J156 Pneumonia due to other aerobic Gram-negative bacteria: Secondary | ICD-10-CM | POA: Diagnosis not present

## 2018-10-21 DIAGNOSIS — F209 Schizophrenia, unspecified: Secondary | ICD-10-CM

## 2018-10-21 DIAGNOSIS — I38 Endocarditis, valve unspecified: Secondary | ICD-10-CM | POA: Diagnosis not present

## 2018-10-21 DIAGNOSIS — I110 Hypertensive heart disease with heart failure: Secondary | ICD-10-CM | POA: Diagnosis not present

## 2018-10-21 DIAGNOSIS — Z452 Encounter for adjustment and management of vascular access device: Secondary | ICD-10-CM | POA: Diagnosis not present

## 2018-10-21 DIAGNOSIS — J158 Pneumonia due to other specified bacteria: Secondary | ICD-10-CM | POA: Diagnosis not present

## 2018-10-21 MED ORDER — AMITRIPTYLINE HCL 25 MG PO TABS: 12.5000 mg | ORAL_TABLET | Freq: Every day | ORAL | 2 refills | Status: DC

## 2018-10-21 NOTE — Telephone Encounter (Signed)
Will let the pharmacy know

## 2018-10-21 NOTE — Telephone Encounter (Signed)
Your name is not on these prescription request---Refill??

## 2018-10-21 NOTE — Telephone Encounter (Signed)
Caller name: Sophia  Relation to pt: from [place] service  Call back number: [phone]    Reason for call:  Requesting psychiatrist referral, please advise when referral is placed. (as per [place] states please place referral in [place])

## 2018-10-21 NOTE — Telephone Encounter (Signed)
Will cont to refill psych meds until he can get in w specialist. He should contact his cardiologist for the dig and amiodarone. Ty.

## 2018-10-23 ENCOUNTER — Other Ambulatory Visit: Payer: Self-pay | Admitting: Family Medicine

## 2018-10-23 NOTE — Telephone Encounter (Signed)
Please advise 

## 2018-10-23 NOTE — Telephone Encounter (Signed)
Psych referral was placed on 10/21/2018. Does he have a cardiologist?

## 2018-10-23 NOTE — Telephone Encounter (Signed)
Sophia and pt called again regarding this referral. Please advise.

## 2018-10-23 NOTE — Telephone Encounter (Signed)
Medication Refill - Medication: digoxin (LANOXIN) 0.125 MG tablet    Has the patient contacted their pharmacy? Yes.  Pt called stating he is out of medication and has been out of it for a while. Please advise.  (Agent: If no, request that the patient contact the pharmacy for the refill.) (Agent: If yes, when and what did the pharmacy advise?)  Preferred Pharmacy (with phone number or street name):  [facility] [address] [place], [place] - [address]  [place] [address]  Phone: [phone] Fax: [phone]  Not a 24 hour pharmacy; exact hours not known.     Agent: Please be advised that RX refills may take up to 3 business days. We ask that you follow-up with your pharmacy.

## 2018-10-24 DIAGNOSIS — I38 Endocarditis, valve unspecified: Secondary | ICD-10-CM | POA: Diagnosis not present

## 2018-10-24 DIAGNOSIS — J156 Pneumonia due to other aerobic Gram-negative bacteria: Secondary | ICD-10-CM | POA: Diagnosis not present

## 2018-10-24 DIAGNOSIS — I110 Hypertensive heart disease with heart failure: Secondary | ICD-10-CM | POA: Diagnosis not present

## 2018-10-24 DIAGNOSIS — J158 Pneumonia due to other specified bacteria: Secondary | ICD-10-CM | POA: Diagnosis not present

## 2018-10-24 DIAGNOSIS — J440 Chronic obstructive pulmonary disease with acute lower respiratory infection: Secondary | ICD-10-CM | POA: Diagnosis not present

## 2018-10-24 DIAGNOSIS — Z452 Encounter for adjustment and management of vascular access device: Secondary | ICD-10-CM | POA: Diagnosis not present

## 2018-10-24 MED ORDER — DIGOXIN 125 MCG PO TABS: 0.1250 mg | ORAL_TABLET | Freq: Every day | ORAL | 0 refills | Status: DC

## 2018-10-24 NOTE — Telephone Encounter (Signed)
Patient returning call from [place]. Please advise and call back is [phone].

## 2018-10-24 NOTE — Telephone Encounter (Signed)
Called left message to call back 

## 2018-10-24 NOTE — Telephone Encounter (Signed)
OK to refill while he gets in with cards. Ty.

## 2018-10-24 NOTE — Telephone Encounter (Signed)
Ok to refill heart medicatiom

## 2018-10-24 NOTE — Telephone Encounter (Signed)
He is getting a new cardiologist the end of the month so does need a refill Informed of the psy referral..

## 2018-10-24 NOTE — Addendum Note (Signed)
Addended by: Sharon Seller B on: 10/24/2018 11:01 AM   Modules accepted: Orders

## 2018-10-25 ENCOUNTER — Other Ambulatory Visit: Payer: Self-pay | Admitting: Physical Medicine and Rehabilitation

## 2018-10-27 NOTE — Telephone Encounter (Signed)
Recieved electronic medication refill request for pacerone medication, unsure if ok to refill this medication, please advise.

## 2018-10-27 NOTE — Addendum Note (Signed)
Addended by: Marland Mcalpine B on: 10/27/2018 03:19 PM   Modules accepted: Orders

## 2018-10-27 NOTE — Telephone Encounter (Signed)
Order declined with message to pharmacy to address patients PCP or Cardiologist.

## 2018-10-27 NOTE — Telephone Encounter (Signed)
Needs to see PCP vs Cards.  Thanks.

## 2018-10-28 ENCOUNTER — Other Ambulatory Visit: Payer: Self-pay | Admitting: Family Medicine

## 2018-10-28 DIAGNOSIS — J158 Pneumonia due to other specified bacteria: Secondary | ICD-10-CM | POA: Diagnosis not present

## 2018-10-28 DIAGNOSIS — J156 Pneumonia due to other aerobic Gram-negative bacteria: Secondary | ICD-10-CM | POA: Diagnosis not present

## 2018-10-28 DIAGNOSIS — J440 Chronic obstructive pulmonary disease with acute lower respiratory infection: Secondary | ICD-10-CM | POA: Diagnosis not present

## 2018-10-28 DIAGNOSIS — I110 Hypertensive heart disease with heart failure: Secondary | ICD-10-CM | POA: Diagnosis not present

## 2018-10-28 DIAGNOSIS — Z452 Encounter for adjustment and management of vascular access device: Secondary | ICD-10-CM | POA: Diagnosis not present

## 2018-10-28 DIAGNOSIS — I38 Endocarditis, valve unspecified: Secondary | ICD-10-CM | POA: Diagnosis not present

## 2018-10-28 MED ORDER — AMIODARONE HCL 100 MG PO TABS: 100.0000 mg | ORAL_TABLET | Freq: Two times a day (BID) | ORAL | 0 refills | Status: DC

## 2018-10-28 NOTE — Telephone Encounter (Signed)
Faxed over referral/[employer].-- [phone] Last 2 office notes Demographics Current meds. Completed form/PCP signed

## 2018-11-05 ENCOUNTER — Encounter: Payer: Self-pay | Admitting: Family Medicine

## 2018-11-05 ENCOUNTER — Other Ambulatory Visit: Payer: Self-pay

## 2018-11-05 ENCOUNTER — Ambulatory Visit (INDEPENDENT_AMBULATORY_CARE_PROVIDER_SITE_OTHER): Payer: Medicare Other | Admitting: Family Medicine

## 2018-11-05 VITALS — BP 124/87 | HR 109 | Temp 98.5°F | Resp 18

## 2018-11-05 DIAGNOSIS — M79605 Pain in left leg: Secondary | ICD-10-CM

## 2018-11-05 DIAGNOSIS — G546 Phantom limb syndrome with pain: Secondary | ICD-10-CM

## 2018-11-05 DIAGNOSIS — Z89611 Acquired absence of right leg above knee: Secondary | ICD-10-CM | POA: Diagnosis not present

## 2018-11-05 DIAGNOSIS — L989 Disorder of the skin and subcutaneous tissue, unspecified: Secondary | ICD-10-CM | POA: Diagnosis not present

## 2018-11-05 DIAGNOSIS — M79604 Pain in right leg: Secondary | ICD-10-CM | POA: Diagnosis not present

## 2018-11-05 MED ORDER — TERBINAFINE HCL 250 MG PO TABS: 250.0000 mg | ORAL_TABLET | Freq: Every day | ORAL | 1 refills | Status: DC

## 2018-11-05 NOTE — Patient Instructions (Addendum)
If you do not hear anything about your referrals in the next 1-2 weeks, call our office and ask for an update.  We will be in touch regarding your ultrasound.   Let us know if you need anything.
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
## 2018-11-06 ENCOUNTER — Encounter: Payer: Medicare Other | Attending: Physical Medicine & Rehabilitation | Admitting: Physical Medicine & Rehabilitation

## 2018-11-06 DIAGNOSIS — I482 Chronic atrial fibrillation, unspecified: Secondary | ICD-10-CM | POA: Insufficient documentation

## 2018-11-06 DIAGNOSIS — I330 Acute and subacute infective endocarditis: Secondary | ICD-10-CM | POA: Insufficient documentation

## 2018-11-12 ENCOUNTER — Ambulatory Visit (HOSPITAL_COMMUNITY): Payer: Medicare Other | Admitting: Psychiatry

## 2018-11-14 ENCOUNTER — Other Ambulatory Visit: Payer: Self-pay | Admitting: Family Medicine

## 2018-11-14 NOTE — Telephone Encounter (Signed)
I will refill from now. If he doesn't have a cardiologist, he needs to find out or we will refer him. Ty.

## 2018-11-14 NOTE — Telephone Encounter (Signed)
Do you want to continue refilling this thought his cardiologist was?

## 2018-11-17 ENCOUNTER — Ambulatory Visit (HOSPITAL_BASED_OUTPATIENT_CLINIC_OR_DEPARTMENT_OTHER)
Admission: RE | Admit: 2018-11-17 | Discharge: 2018-11-17 | Disposition: A | Discharge status: Home / Self Care | Payer: Medicare Other | Source: Ambulatory Visit | Attending: Family Medicine | Admitting: Family Medicine

## 2018-11-17 ENCOUNTER — Other Ambulatory Visit: Payer: Self-pay

## 2018-11-17 ENCOUNTER — Other Ambulatory Visit: Payer: Self-pay | Admitting: Family Medicine

## 2018-11-17 DIAGNOSIS — L989 Disorder of the skin and subcutaneous tissue, unspecified: Secondary | ICD-10-CM | POA: Diagnosis not present

## 2018-11-17 DIAGNOSIS — R1907 Generalized intra-abdominal and pelvic swelling, mass and lump: Secondary | ICD-10-CM

## 2018-11-17 DIAGNOSIS — R1901 Right upper quadrant abdominal swelling, mass and lump: Secondary | ICD-10-CM | POA: Diagnosis not present

## 2018-11-19 ENCOUNTER — Other Ambulatory Visit: Payer: Self-pay | Admitting: Family Medicine

## 2018-11-19 DIAGNOSIS — M7989 Other specified soft tissue disorders: Secondary | ICD-10-CM

## 2018-11-24 ENCOUNTER — Ambulatory Visit (HOSPITAL_COMMUNITY): Payer: Medicare Other | Admitting: Psychiatry

## 2018-12-02 ENCOUNTER — Other Ambulatory Visit: Payer: Self-pay | Admitting: Family Medicine

## 2018-12-02 DIAGNOSIS — F209 Schizophrenia, unspecified: Secondary | ICD-10-CM

## 2018-12-02 DIAGNOSIS — F4312 Post-traumatic stress disorder, chronic: Secondary | ICD-10-CM

## 2018-12-03 ENCOUNTER — Other Ambulatory Visit: Payer: Self-pay | Admitting: Family Medicine

## 2018-12-03 NOTE — Telephone Encounter (Signed)
Filled previously by another provider

## 2018-12-04 ENCOUNTER — Other Ambulatory Visit: Payer: Self-pay | Admitting: Family Medicine

## 2018-12-04 ENCOUNTER — Telehealth: Payer: Self-pay | Admitting: *Deleted

## 2018-12-04 MED ORDER — FOLIC ACID 1 MG PO TABS: 1.0000 mg | ORAL_TABLET | Freq: Every day | ORAL | 1 refills | Status: DC

## 2018-12-04 NOTE — Telephone Encounter (Signed)
Gwen can you check on the referral for cardiology that was put in on 10/09/18.

## 2018-12-04 NOTE — Telephone Encounter (Signed)
[facility] sent a request for folic acid.  This was last filled by Reesa Chew PA-C (maybe cardiology).  Would you like to refill?

## 2018-12-04 NOTE — Addendum Note (Signed)
Addended by: Kem Boroughs D on: 12/04/2018 03:31 PM   Modules accepted: Orders

## 2018-12-04 NOTE — Telephone Encounter (Signed)
That's fine to refill Folate. Already filled Eliquis. What is his status with getting set up with a cardiologist? I don't have great comfort prescribing amiodarone for long periods of time. I can prescribe until he can get in. Place referral if he has not started the process, diagnosis atrial fibrillation. Ty.

## 2018-12-04 NOTE — Telephone Encounter (Signed)
Also need refill for Eliquis.

## 2018-12-05 ENCOUNTER — Other Ambulatory Visit: Payer: Self-pay | Admitting: Family Medicine

## 2018-12-05 ENCOUNTER — Other Ambulatory Visit: Payer: Self-pay

## 2018-12-05 ENCOUNTER — Encounter: Payer: Self-pay | Admitting: Family Medicine

## 2018-12-05 ENCOUNTER — Ambulatory Visit (INDEPENDENT_AMBULATORY_CARE_PROVIDER_SITE_OTHER): Payer: Medicare Other | Admitting: Family Medicine

## 2018-12-05 VITALS — BP 124/90 | HR 57 | Temp 98.5°F

## 2018-12-05 DIAGNOSIS — Z89611 Acquired absence of right leg above knee: Secondary | ICD-10-CM

## 2018-12-05 DIAGNOSIS — M79605 Pain in left leg: Secondary | ICD-10-CM

## 2018-12-05 DIAGNOSIS — N401 Enlarged prostate with lower urinary tract symptoms: Secondary | ICD-10-CM

## 2018-12-05 DIAGNOSIS — F319 Bipolar disorder, unspecified: Secondary | ICD-10-CM

## 2018-12-05 DIAGNOSIS — M79604 Pain in right leg: Secondary | ICD-10-CM

## 2018-12-05 DIAGNOSIS — R350 Frequency of micturition: Secondary | ICD-10-CM

## 2018-12-05 DIAGNOSIS — I4891 Unspecified atrial fibrillation: Secondary | ICD-10-CM | POA: Diagnosis not present

## 2018-12-05 DIAGNOSIS — E1165 Type 2 diabetes mellitus with hyperglycemia: Secondary | ICD-10-CM

## 2018-12-05 DIAGNOSIS — Z89612 Acquired absence of left leg above knee: Secondary | ICD-10-CM

## 2018-12-05 HISTORY — DX: Benign prostatic hyperplasia with lower urinary tract symptoms: N40.1

## 2018-12-05 HISTORY — DX: Type 2 diabetes mellitus with hyperglycemia: E11.65

## 2018-12-05 HISTORY — DX: Bipolar disorder, unspecified: F31.9

## 2018-12-05 LAB — COMPREHENSIVE METABOLIC PANEL
ALT: 8 U/L (ref 0–53)
AST: 13 U/L (ref 0–37)
Albumin: 4 g/dL (ref 3.5–5.2)
Alkaline Phosphatase: 65 U/L (ref 39–117)
BUN: 11 mg/dL (ref 6–23)
CO2: 25 mEq/L (ref 19–32)
Calcium: 9.4 mg/dL (ref 8.4–10.5)
Chloride: 107 mEq/L (ref 96–112)
Creatinine, Ser: 0.65 mg/dL (ref 0.40–1.50)
GFR: 124.35 mL/min (ref >60.00)
Glucose, Bld: 125 mg/dL — ABNORMAL HIGH (ref 70–99)
Potassium: 3.9 mEq/L (ref 3.5–5.1)
Sodium: 141 mEq/L (ref 135–145)
Total Bilirubin: 0.3 mg/dL (ref 0.2–1.2)
Total Protein: 7.1 g/dL (ref 6.0–8.3)

## 2018-12-05 LAB — MICROALBUMIN / CREATININE URINE RATIO
Creatinine,U: 32.1 mg/dL
Microalb Creat Ratio: 2.2 mg/g (ref 0.0–30.0)
Microalb, Ur: <0.7 mg/dL (ref 0.0–1.9)

## 2018-12-05 LAB — LIPID PANEL
Cholesterol: 279 mg/dL — ABNORMAL HIGH (ref 0–200)
HDL: 49.9 mg/dL (ref >39.00)
NonHDL: 228.64
Total CHOL/HDL Ratio: 6
Triglycerides: 219 mg/dL — ABNORMAL HIGH (ref 0.0–149.0)
VLDL: 43.8 mg/dL — ABNORMAL HIGH (ref 0.0–40.0)

## 2018-12-05 LAB — HEMOGLOBIN A1C: Hgb A1c MFr Bld: 6.5 % (ref 4.6–6.5)

## 2018-12-05 LAB — LDL CHOLESTEROL, DIRECT: Direct LDL: 212 mg/dL

## 2018-12-05 MED ORDER — DIGOXIN 125 MCG PO TABS: 0.1250 mg | ORAL_TABLET | Freq: Every day | ORAL | 0 refills | Status: DC

## 2018-12-05 MED ORDER — AMIODARONE HCL 100 MG PO TABS: 100.0000 mg | ORAL_TABLET | Freq: Two times a day (BID) | ORAL | 0 refills | Status: DC

## 2018-12-05 MED ORDER — TAMSULOSIN HCL 0.4 MG PO CAPS: 0.4000 mg | ORAL_CAPSULE | Freq: Every day | ORAL | 1 refills | Status: DC

## 2018-12-05 NOTE — Progress Notes (Signed)
AMBU
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
## 2018-12-05 NOTE — Patient Instructions (Signed)
If you do not hear anything about your referral in the next 1-2 weeks, call our office and ask for an update.  Give us 2-3 business days to get the results of your labs back.   Keep the diet clean and stay active.  Let us know if you need anything.  

## 2018-12-10 DIAGNOSIS — J9621 Acute and chronic respiratory failure with hypoxia: Secondary | ICD-10-CM | POA: Diagnosis not present

## 2018-12-10 DIAGNOSIS — Z89611 Acquired absence of right leg above knee: Secondary | ICD-10-CM | POA: Diagnosis not present

## 2018-12-10 DIAGNOSIS — N401 Enlarged prostate with lower urinary tract symptoms: Secondary | ICD-10-CM | POA: Diagnosis not present

## 2018-12-10 DIAGNOSIS — F209 Schizophrenia, unspecified: Secondary | ICD-10-CM | POA: Diagnosis not present

## 2018-12-10 DIAGNOSIS — G8929 Other chronic pain: Secondary | ICD-10-CM | POA: Diagnosis not present

## 2018-12-10 DIAGNOSIS — J449 Chronic obstructive pulmonary disease, unspecified: Secondary | ICD-10-CM | POA: Diagnosis not present

## 2018-12-10 DIAGNOSIS — F17200 Nicotine dependence, unspecified, uncomplicated: Secondary | ICD-10-CM | POA: Diagnosis not present

## 2018-12-10 DIAGNOSIS — Z89612 Acquired absence of left leg above knee: Secondary | ICD-10-CM | POA: Diagnosis not present

## 2018-12-10 DIAGNOSIS — Z7902 Long term (current) use of antithrombotics/antiplatelets: Secondary | ICD-10-CM | POA: Diagnosis not present

## 2018-12-10 DIAGNOSIS — I82621 Acute embolism and thrombosis of deep veins of right upper extremity: Secondary | ICD-10-CM | POA: Diagnosis not present

## 2018-12-10 DIAGNOSIS — I10 Essential (primary) hypertension: Secondary | ICD-10-CM | POA: Diagnosis not present

## 2018-12-10 DIAGNOSIS — F319 Bipolar disorder, unspecified: Secondary | ICD-10-CM | POA: Diagnosis not present

## 2018-12-10 DIAGNOSIS — F4312 Post-traumatic stress disorder, chronic: Secondary | ICD-10-CM | POA: Diagnosis not present

## 2018-12-10 DIAGNOSIS — I4891 Unspecified atrial fibrillation: Secondary | ICD-10-CM | POA: Diagnosis not present

## 2018-12-10 DIAGNOSIS — G40909 Epilepsy, unspecified, not intractable, without status epilepticus: Secondary | ICD-10-CM | POA: Diagnosis not present

## 2018-12-10 DIAGNOSIS — E1165 Type 2 diabetes mellitus with hyperglycemia: Secondary | ICD-10-CM | POA: Diagnosis not present

## 2018-12-15 ENCOUNTER — Telehealth: Payer: Self-pay | Admitting: Family Medicine

## 2018-12-15 DIAGNOSIS — G546 Phantom limb syndrome with pain: Secondary | ICD-10-CM

## 2018-12-15 DIAGNOSIS — Z89611 Acquired absence of right leg above knee: Secondary | ICD-10-CM | POA: Diagnosis not present

## 2018-12-15 DIAGNOSIS — Z89612 Acquired absence of left leg above knee: Secondary | ICD-10-CM | POA: Diagnosis not present

## 2018-12-15 DIAGNOSIS — G8929 Other chronic pain: Secondary | ICD-10-CM | POA: Diagnosis not present

## 2018-12-15 DIAGNOSIS — I10 Essential (primary) hypertension: Secondary | ICD-10-CM | POA: Diagnosis not present

## 2018-12-15 DIAGNOSIS — I4891 Unspecified atrial fibrillation: Secondary | ICD-10-CM | POA: Diagnosis not present

## 2018-12-15 DIAGNOSIS — E1165 Type 2 diabetes mellitus with hyperglycemia: Secondary | ICD-10-CM | POA: Diagnosis not present

## 2018-12-15 NOTE — Telephone Encounter (Signed)
Copied from [place] [phone]. Topic: Quick Communication - Home Health Verbal Orders >> Dec 15, 2018  3:30 PM Yvette Rack wrote: Caller/Agency: Hilliard Clark with [employer] Number: [phone] secure voicemail Requesting OT/PT/Skilled Nursing/Social Work/Speech Therapy: PT Frequency: 2 times a week for 3 weeks which includes today's visit   Per Hilliard Clark pt requests order for power wheelchair please fax to [phone]

## 2018-12-15 NOTE — Telephone Encounter (Signed)
Called left detailed message  Faxed order for wheel chair

## 2018-12-17 DIAGNOSIS — G8929 Other chronic pain: Secondary | ICD-10-CM | POA: Diagnosis not present

## 2018-12-17 DIAGNOSIS — I4891 Unspecified atrial fibrillation: Secondary | ICD-10-CM | POA: Diagnosis not present

## 2018-12-17 DIAGNOSIS — I10 Essential (primary) hypertension: Secondary | ICD-10-CM | POA: Diagnosis not present

## 2018-12-17 DIAGNOSIS — Z89612 Acquired absence of left leg above knee: Secondary | ICD-10-CM | POA: Diagnosis not present

## 2018-12-17 DIAGNOSIS — E1165 Type 2 diabetes mellitus with hyperglycemia: Secondary | ICD-10-CM | POA: Diagnosis not present

## 2018-12-17 DIAGNOSIS — Z89611 Acquired absence of right leg above knee: Secondary | ICD-10-CM | POA: Diagnosis not present

## 2018-12-18 ENCOUNTER — Other Ambulatory Visit: Payer: Self-pay | Admitting: Family Medicine

## 2018-12-18 DIAGNOSIS — I4891 Unspecified atrial fibrillation: Secondary | ICD-10-CM | POA: Diagnosis not present

## 2018-12-18 DIAGNOSIS — Z89612 Acquired absence of left leg above knee: Secondary | ICD-10-CM | POA: Diagnosis not present

## 2018-12-18 DIAGNOSIS — Z89611 Acquired absence of right leg above knee: Secondary | ICD-10-CM | POA: Diagnosis not present

## 2018-12-18 DIAGNOSIS — E1165 Type 2 diabetes mellitus with hyperglycemia: Secondary | ICD-10-CM | POA: Diagnosis not present

## 2018-12-18 DIAGNOSIS — I10 Essential (primary) hypertension: Secondary | ICD-10-CM | POA: Diagnosis not present

## 2018-12-18 DIAGNOSIS — G8929 Other chronic pain: Secondary | ICD-10-CM | POA: Diagnosis not present

## 2018-12-19 NOTE — Telephone Encounter (Signed)
Pacerone and amiodarone are the same things. I would like him to see cardiology if he hasn't. OK to refill others. We had placed referral, any update? Ty.

## 2018-12-19 NOTE — Telephone Encounter (Signed)
Refilled others Will call the patient

## 2018-12-19 NOTE — Telephone Encounter (Signed)
Not sure to fill all these??

## 2018-12-22 DIAGNOSIS — Z89611 Acquired absence of right leg above knee: Secondary | ICD-10-CM

## 2018-12-22 DIAGNOSIS — Z89612 Acquired absence of left leg above knee: Secondary | ICD-10-CM

## 2018-12-22 DIAGNOSIS — E1165 Type 2 diabetes mellitus with hyperglycemia: Secondary | ICD-10-CM

## 2018-12-22 DIAGNOSIS — I4891 Unspecified atrial fibrillation: Secondary | ICD-10-CM

## 2018-12-22 DIAGNOSIS — F319 Bipolar disorder, unspecified: Secondary | ICD-10-CM

## 2018-12-22 DIAGNOSIS — F17200 Nicotine dependence, unspecified, uncomplicated: Secondary | ICD-10-CM

## 2018-12-22 DIAGNOSIS — N401 Enlarged prostate with lower urinary tract symptoms: Secondary | ICD-10-CM

## 2018-12-22 DIAGNOSIS — F4312 Post-traumatic stress disorder, chronic: Secondary | ICD-10-CM

## 2018-12-22 DIAGNOSIS — G40909 Epilepsy, unspecified, not intractable, without status epilepticus: Secondary | ICD-10-CM

## 2018-12-22 DIAGNOSIS — J449 Chronic obstructive pulmonary disease, unspecified: Secondary | ICD-10-CM

## 2018-12-22 DIAGNOSIS — G8929 Other chronic pain: Secondary | ICD-10-CM

## 2018-12-22 DIAGNOSIS — F209 Schizophrenia, unspecified: Secondary | ICD-10-CM

## 2018-12-22 DIAGNOSIS — I10 Essential (primary) hypertension: Secondary | ICD-10-CM

## 2018-12-22 DIAGNOSIS — Z7902 Long term (current) use of antithrombotics/antiplatelets: Secondary | ICD-10-CM

## 2018-12-22 DIAGNOSIS — I82621 Acute embolism and thrombosis of deep veins of right upper extremity: Secondary | ICD-10-CM

## 2018-12-22 DIAGNOSIS — J9621 Acute and chronic respiratory failure with hypoxia: Secondary | ICD-10-CM

## 2018-12-24 ENCOUNTER — Ambulatory Visit: Payer: Medicare Other | Admitting: Cardiology

## 2018-12-24 DIAGNOSIS — G8929 Other chronic pain: Secondary | ICD-10-CM | POA: Diagnosis not present

## 2018-12-24 DIAGNOSIS — I10 Essential (primary) hypertension: Secondary | ICD-10-CM | POA: Diagnosis not present

## 2018-12-24 DIAGNOSIS — I4891 Unspecified atrial fibrillation: Secondary | ICD-10-CM | POA: Diagnosis not present

## 2018-12-24 DIAGNOSIS — Z89611 Acquired absence of right leg above knee: Secondary | ICD-10-CM | POA: Diagnosis not present

## 2018-12-24 DIAGNOSIS — Z89612 Acquired absence of left leg above knee: Secondary | ICD-10-CM | POA: Diagnosis not present

## 2018-12-24 DIAGNOSIS — E1165 Type 2 diabetes mellitus with hyperglycemia: Secondary | ICD-10-CM | POA: Diagnosis not present

## 2018-12-25 ENCOUNTER — Ambulatory Visit (INDEPENDENT_AMBULATORY_CARE_PROVIDER_SITE_OTHER): Payer: Medicare Other | Admitting: Cardiology

## 2018-12-25 ENCOUNTER — Other Ambulatory Visit: Payer: Self-pay

## 2018-12-25 ENCOUNTER — Encounter: Payer: Self-pay | Admitting: Cardiology

## 2018-12-25 VITALS — BP 122/60 | HR 67 | Wt 220.0 lb

## 2018-12-25 DIAGNOSIS — F209 Schizophrenia, unspecified: Secondary | ICD-10-CM

## 2018-12-25 DIAGNOSIS — Z89611 Acquired absence of right leg above knee: Secondary | ICD-10-CM | POA: Diagnosis not present

## 2018-12-25 DIAGNOSIS — Z8679 Personal history of other diseases of the circulatory system: Secondary | ICD-10-CM

## 2018-12-25 DIAGNOSIS — I10 Essential (primary) hypertension: Secondary | ICD-10-CM | POA: Diagnosis not present

## 2018-12-25 DIAGNOSIS — I420 Dilated cardiomyopathy: Secondary | ICD-10-CM

## 2018-12-25 DIAGNOSIS — I480 Paroxysmal atrial fibrillation: Secondary | ICD-10-CM

## 2018-12-25 DIAGNOSIS — I4891 Unspecified atrial fibrillation: Secondary | ICD-10-CM | POA: Diagnosis not present

## 2018-12-25 DIAGNOSIS — G8929 Other chronic pain: Secondary | ICD-10-CM | POA: Diagnosis not present

## 2018-12-25 DIAGNOSIS — E1165 Type 2 diabetes mellitus with hyperglycemia: Secondary | ICD-10-CM | POA: Diagnosis not present

## 2018-12-25 DIAGNOSIS — Z89612 Acquired absence of left leg above knee: Secondary | ICD-10-CM | POA: Diagnosis not present

## 2018-12-25 HISTORY — DX: Personal history of other diseases of the circulatory system: Z86.79

## 2018-12-25 HISTORY — DX: Dilated cardiomyopathy: I42.0

## 2018-12-25 MED ORDER — LOSARTAN POTASSIUM 25 MG PO TABS: 25.0000 mg | ORAL_TABLET | Freq: Two times a day (BID) | ORAL | 1 refills | Status: DC

## 2018-12-25 NOTE — Patient Instructions (Signed)
Medication Instructions:  Your physician has recommended you make the following change in your medication: START LOSARTAN 25 MG TWICE DAILY  If you need a refill on your cardiac medications before your next appointment, please call your pharmacy.   Lab work: NONE  If you have labs (blood work) drawn today and your tests are completely normal, you will receive your results only by: Marland Kitchen MyChart Message (if you have MyChart) OR . A paper copy in the mail If you have any lab test that is abnormal or we need to change your treatment, we will call you to review the results.  Testing/Procedures: You had an EKG today  Follow-Up: At [facility], you and your health needs are our priority.  As part of our continuing mission to provide you with exceptional heart care, we have created designated Provider Care Teams.  These Care Teams include your primary Cardiologist (physician) and Advanced Practice Providers (APPs -  Physician Assistants and Nurse Practitioners) who all work together to provide you with the care you need, when you need it. You will need a follow up appointment in 1 months.  Please call our office 2 months in advance to schedule this appointment.  You may see No primary care provider on file. or another member of our [employer] Provider Team in [place]: Shirlee More, MD . Jyl Heinz, MD  Any Other Special Instructions Will Be Listed Below (If Applicable).
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
## 2018-12-26 DIAGNOSIS — I10 Essential (primary) hypertension: Secondary | ICD-10-CM | POA: Diagnosis not present

## 2018-12-26 DIAGNOSIS — G8929 Other chronic pain: Secondary | ICD-10-CM | POA: Diagnosis not present

## 2018-12-26 DIAGNOSIS — Z89612 Acquired absence of left leg above knee: Secondary | ICD-10-CM | POA: Diagnosis not present

## 2018-12-26 DIAGNOSIS — Z89611 Acquired absence of right leg above knee: Secondary | ICD-10-CM | POA: Diagnosis not present

## 2018-12-26 DIAGNOSIS — E1165 Type 2 diabetes mellitus with hyperglycemia: Secondary | ICD-10-CM | POA: Diagnosis not present

## 2018-12-26 DIAGNOSIS — I4891 Unspecified atrial fibrillation: Secondary | ICD-10-CM | POA: Diagnosis not present

## 2018-12-28 DIAGNOSIS — E1165 Type 2 diabetes mellitus with hyperglycemia: Secondary | ICD-10-CM | POA: Diagnosis not present

## 2018-12-28 DIAGNOSIS — Z89612 Acquired absence of left leg above knee: Secondary | ICD-10-CM | POA: Diagnosis not present

## 2018-12-28 DIAGNOSIS — I4891 Unspecified atrial fibrillation: Secondary | ICD-10-CM | POA: Diagnosis not present

## 2018-12-28 DIAGNOSIS — Z89611 Acquired absence of right leg above knee: Secondary | ICD-10-CM | POA: Diagnosis not present

## 2018-12-28 DIAGNOSIS — I10 Essential (primary) hypertension: Secondary | ICD-10-CM | POA: Diagnosis not present

## 2018-12-28 DIAGNOSIS — G8929 Other chronic pain: Secondary | ICD-10-CM | POA: Diagnosis not present

## 2018-12-29 DIAGNOSIS — I4891 Unspecified atrial fibrillation: Secondary | ICD-10-CM | POA: Diagnosis not present

## 2018-12-29 DIAGNOSIS — E1165 Type 2 diabetes mellitus with hyperglycemia: Secondary | ICD-10-CM | POA: Diagnosis not present

## 2018-12-29 DIAGNOSIS — I10 Essential (primary) hypertension: Secondary | ICD-10-CM | POA: Diagnosis not present

## 2018-12-29 DIAGNOSIS — Z89611 Acquired absence of right leg above knee: Secondary | ICD-10-CM | POA: Diagnosis not present

## 2018-12-29 DIAGNOSIS — Z89612 Acquired absence of left leg above knee: Secondary | ICD-10-CM | POA: Diagnosis not present

## 2018-12-29 DIAGNOSIS — G8929 Other chronic pain: Secondary | ICD-10-CM | POA: Diagnosis not present

## 2018-12-31 DIAGNOSIS — E1165 Type 2 diabetes mellitus with hyperglycemia: Secondary | ICD-10-CM | POA: Diagnosis not present

## 2018-12-31 DIAGNOSIS — Z89612 Acquired absence of left leg above knee: Secondary | ICD-10-CM | POA: Diagnosis not present

## 2018-12-31 DIAGNOSIS — I4891 Unspecified atrial fibrillation: Secondary | ICD-10-CM | POA: Diagnosis not present

## 2018-12-31 DIAGNOSIS — I10 Essential (primary) hypertension: Secondary | ICD-10-CM | POA: Diagnosis not present

## 2018-12-31 DIAGNOSIS — Z89611 Acquired absence of right leg above knee: Secondary | ICD-10-CM | POA: Diagnosis not present

## 2018-12-31 DIAGNOSIS — G8929 Other chronic pain: Secondary | ICD-10-CM | POA: Diagnosis not present

## 2019-01-01 ENCOUNTER — Other Ambulatory Visit: Payer: Self-pay | Admitting: Family Medicine

## 2019-01-02 ENCOUNTER — Other Ambulatory Visit: Payer: Self-pay | Admitting: Family Medicine

## 2019-01-08 ENCOUNTER — Other Ambulatory Visit: Payer: Self-pay | Admitting: Family Medicine

## 2019-01-08 DIAGNOSIS — F4312 Post-traumatic stress disorder, chronic: Secondary | ICD-10-CM

## 2019-01-08 DIAGNOSIS — F209 Schizophrenia, unspecified: Secondary | ICD-10-CM

## 2019-01-08 NOTE — Telephone Encounter (Signed)
Last Clonazepam RX: 10/13/18,  #60 x 2 refills Last OV: 12/05/18 Next OV: 03/09/19 UDS: not on file CSC: not on file

## 2019-01-08 NOTE — Telephone Encounter (Signed)
He should have 2 refills at pharmacy. Ty.

## 2019-01-09 ENCOUNTER — Other Ambulatory Visit: Payer: Self-pay | Admitting: Family Medicine

## 2019-01-09 DIAGNOSIS — J449 Chronic obstructive pulmonary disease, unspecified: Secondary | ICD-10-CM | POA: Diagnosis not present

## 2019-01-09 DIAGNOSIS — N401 Enlarged prostate with lower urinary tract symptoms: Secondary | ICD-10-CM | POA: Diagnosis not present

## 2019-01-09 DIAGNOSIS — E1165 Type 2 diabetes mellitus with hyperglycemia: Secondary | ICD-10-CM | POA: Diagnosis not present

## 2019-01-09 DIAGNOSIS — J9621 Acute and chronic respiratory failure with hypoxia: Secondary | ICD-10-CM | POA: Diagnosis not present

## 2019-01-09 DIAGNOSIS — I82621 Acute embolism and thrombosis of deep veins of right upper extremity: Secondary | ICD-10-CM | POA: Diagnosis not present

## 2019-01-09 DIAGNOSIS — F17200 Nicotine dependence, unspecified, uncomplicated: Secondary | ICD-10-CM | POA: Diagnosis not present

## 2019-01-09 DIAGNOSIS — F209 Schizophrenia, unspecified: Secondary | ICD-10-CM

## 2019-01-09 DIAGNOSIS — I10 Essential (primary) hypertension: Secondary | ICD-10-CM | POA: Diagnosis not present

## 2019-01-09 DIAGNOSIS — F4312 Post-traumatic stress disorder, chronic: Secondary | ICD-10-CM

## 2019-01-09 DIAGNOSIS — I4891 Unspecified atrial fibrillation: Secondary | ICD-10-CM | POA: Diagnosis not present

## 2019-01-09 DIAGNOSIS — F319 Bipolar disorder, unspecified: Secondary | ICD-10-CM | POA: Diagnosis not present

## 2019-01-09 DIAGNOSIS — Z89612 Acquired absence of left leg above knee: Secondary | ICD-10-CM | POA: Diagnosis not present

## 2019-01-09 DIAGNOSIS — Z89611 Acquired absence of right leg above knee: Secondary | ICD-10-CM | POA: Diagnosis not present

## 2019-01-09 DIAGNOSIS — Z7902 Long term (current) use of antithrombotics/antiplatelets: Secondary | ICD-10-CM | POA: Diagnosis not present

## 2019-01-09 DIAGNOSIS — G8929 Other chronic pain: Secondary | ICD-10-CM | POA: Diagnosis not present

## 2019-01-09 DIAGNOSIS — G40909 Epilepsy, unspecified, not intractable, without status epilepticus: Secondary | ICD-10-CM | POA: Diagnosis not present

## 2019-01-09 MED ORDER — CLONAZEPAM 0.5 MG PO TABS: 0.5000 mg | ORAL_TABLET | Freq: Two times a day (BID) | ORAL | 2 refills | Status: DC | PRN

## 2019-01-09 NOTE — Telephone Encounter (Signed)
Called both numbers again/no answer//mailbox full.

## 2019-01-09 NOTE — Telephone Encounter (Signed)
Called both numbers in chart//no answer on one number/ the other number mail box full

## 2019-01-09 NOTE — Telephone Encounter (Signed)
Requested medication (s) are due for refill today: yes  Requested medication (s) are on the active medication list: yes  Last refill:  10/13/2018   #60  2 refills taking 2x a day as needed  Future visit scheduled: yes  Notes to clinic: Not delegated    Requested Prescriptions  Pending Prescriptions Disp Refills   clonazePAM (KLONOPIN) 0.5 MG tablet 60 tablet 2    Sig: Take 1 tablet (0.5 mg total) by mouth 2 (two) times daily as needed for anxiety.     Not Delegated - Psychiatry:  Anxiolytics/Hypnotics Failed - 01/09/2019  3:21 PM      Failed - This refill cannot be delegated      Failed - Urine Drug Screen completed in last 360 days.      Passed - Valid encounter within last 6 months    Recent Outpatient Visits          1 month ago Type 2 diabetes mellitus with hyperglycemia, without long-term current use of insulin ([place])   Archivist at [place], [place]   2 months ago Pain in both lower extremities   Archivist at [facility], [place], [place]   2 months ago Chronic pain syndrome   Archivist at [facility], [place], [place]   3 months ago Physical deconditioning   Archivist at [employer] [facility] [facility], [place], [place]   2 years ago Chronic bilateral low back pain without sciatica   Archivist at [employer] [facility] [facility], [place], [place]      Future Appointments            In 1 week Agustin Cree, Marily Lente, MD [facility]   In 1 month [place], [place], [place] at [employer] [facility], [place]

## 2019-01-09 NOTE — Telephone Encounter (Signed)
Copied from [place] [id number]. Topic: Quick Communication - Rx Refill/Question >> Jan 09, 2019  2:25 PM [name] wrote: Medication: clonazePAM (KLONOPIN) 0.5 MG tablet  Has the patient contacted their pharmacy? Yes.   (Agent: If no, request that the patient contact the pharmacy for the refill.) (Agent: If yes, when and what did the pharmacy advise?)  Preferred Pharmacy (with phone number or street name): [facility], [place] [phone] (Phone) [phone] (Fax)    Agent: Please be advised that RX refills may take up to 3 business days. We ask that you follow-up with your pharmacy.

## 2019-01-09 NOTE — Telephone Encounter (Signed)
Requested medication (s) are due for refill today: yes  Requested medication (s) are on the active medication list: yes  Last refill:  10/13/2018  #60  2refill  Future visit scheduled:yes  Notes to clinic: Not delegated    Requested Prescriptions  Pending Prescriptions Disp Refills   clonazePAM (KLONOPIN) 0.5 MG tablet 60 tablet 2    Sig: Take 1 tablet (0.5 mg total) by mouth 2 (two) times daily as needed for anxiety.     Not Delegated - Psychiatry:  Anxiolytics/Hypnotics Failed - 01/09/2019  2:30 PM      Failed - This refill cannot be delegated      Failed - Urine Drug Screen completed in last 360 days.      Passed - Valid encounter within last 6 months    Recent Outpatient Visits          1 month ago Type 2 diabetes mellitus with hyperglycemia, without long-term current use of insulin ([place])   Archivist at [place], [place]   2 months ago Pain in both lower extremities   Archivist at [employer] [facility] [facility], [place], [place]   2 months ago Chronic pain syndrome   Archivist at [employer] [facility] [facility], [place], DO   3 months ago Physical deconditioning   Archivist at [employer], [place], [place]   2 years ago Chronic bilateral low back pain without sciatica   Archivist at [employer] [facility] [facility], [place], [place]      Future Appointments            In 1 week Agustin Cree, Marily Lente, MD [facility]   In 1 month [place], [place], [place] at [employer], [place]

## 2019-01-13 NOTE — Telephone Encounter (Signed)
Called both numbers again in the chart///no answer//and other number voice mail box full.

## 2019-01-13 NOTE — Telephone Encounter (Signed)
Called no answer no voicemail set up

## 2019-01-20 ENCOUNTER — Telehealth: Payer: Self-pay | Admitting: Family Medicine

## 2019-01-20 DIAGNOSIS — Z89611 Acquired absence of right leg above knee: Secondary | ICD-10-CM | POA: Diagnosis not present

## 2019-01-20 DIAGNOSIS — I10 Essential (primary) hypertension: Secondary | ICD-10-CM | POA: Diagnosis not present

## 2019-01-20 DIAGNOSIS — E1165 Type 2 diabetes mellitus with hyperglycemia: Secondary | ICD-10-CM | POA: Diagnosis not present

## 2019-01-20 DIAGNOSIS — G8929 Other chronic pain: Secondary | ICD-10-CM | POA: Diagnosis not present

## 2019-01-20 DIAGNOSIS — Z89612 Acquired absence of left leg above knee: Secondary | ICD-10-CM | POA: Diagnosis not present

## 2019-01-20 DIAGNOSIS — I4891 Unspecified atrial fibrillation: Secondary | ICD-10-CM | POA: Diagnosis not present

## 2019-01-20 NOTE — Telephone Encounter (Signed)
Copied from [place] [id number]. Topic: General - Other >> Jan 20, 2019 12:22 PM [name] wrote: Reason for CRM: [name] [name] with [facility] home health called to report to [name] that the patient had a fall this morning he was on the floor when she arrived say that he did hit his head and was not acting quite like himself. She called EMS but patient refused to go and get any medical help. She is requesting orders from Dr to have PT and a medical social worker because patient speak of needing help around his house. [name] can be reached at Ph# [phone]
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
## 2019-01-21 NOTE — Telephone Encounter (Signed)
Called left message to call back 

## 2019-01-21 NOTE — Telephone Encounter (Signed)
Let's set up a virtual visit first so he can have at least some evaluation. Ty.

## 2019-01-22 ENCOUNTER — Ambulatory Visit: Payer: Medicare Other | Admitting: Cardiology

## 2019-01-23 ENCOUNTER — Telehealth: Payer: Self-pay | Admitting: Family Medicine

## 2019-01-23 NOTE — Telephone Encounter (Signed)
Noted  

## 2019-01-23 NOTE — Telephone Encounter (Signed)
[name] [name] called to advise he will be moving social work eval to next week due to Pt having to much to do this week to have the visit

## 2019-01-26 NOTE — Telephone Encounter (Signed)
Called informed [place] ok per PCP for this request

## 2019-01-26 NOTE — Telephone Encounter (Signed)
[name] [name] calling with [name] [name] would like a verbal to move PT evaluation to this week. Please advise

## 2019-01-27 ENCOUNTER — Telehealth: Payer: Self-pay | Admitting: Family Medicine

## 2019-01-27 DIAGNOSIS — I10 Essential (primary) hypertension: Secondary | ICD-10-CM | POA: Diagnosis not present

## 2019-01-27 DIAGNOSIS — Z89612 Acquired absence of left leg above knee: Secondary | ICD-10-CM | POA: Diagnosis not present

## 2019-01-27 DIAGNOSIS — E1165 Type 2 diabetes mellitus with hyperglycemia: Secondary | ICD-10-CM | POA: Diagnosis not present

## 2019-01-27 DIAGNOSIS — G8929 Other chronic pain: Secondary | ICD-10-CM | POA: Diagnosis not present

## 2019-01-27 DIAGNOSIS — I4891 Unspecified atrial fibrillation: Secondary | ICD-10-CM | POA: Diagnosis not present

## 2019-01-27 DIAGNOSIS — Z89611 Acquired absence of right leg above knee: Secondary | ICD-10-CM | POA: Diagnosis not present

## 2019-01-27 NOTE — Telephone Encounter (Signed)
[facility] social worker please call back for  1 time a wk for 2 week to assist w/t community resources.   # [phone]

## 2019-01-28 ENCOUNTER — Other Ambulatory Visit: Payer: Self-pay | Admitting: Family Medicine

## 2019-01-28 ENCOUNTER — Telehealth: Payer: Self-pay | Admitting: *Deleted

## 2019-01-28 NOTE — Telephone Encounter (Signed)
Noted  

## 2019-01-28 NOTE — Telephone Encounter (Signed)
Copied from [place] [phone]. Topic: General - Other >> Jan 28, 2019  8:44 AM Rainey Pines A wrote: Elmyra Ricks from [facility] called to inform that she did not see patient on 9/18 because patient declined visit. [phone]

## 2019-01-28 NOTE — Telephone Encounter (Signed)
Called [facility] left detailed message of ok per PCP  For this request.

## 2019-01-30 ENCOUNTER — Encounter: Payer: Self-pay | Admitting: Family Medicine

## 2019-01-30 ENCOUNTER — Other Ambulatory Visit: Payer: Self-pay

## 2019-01-30 ENCOUNTER — Ambulatory Visit (INDEPENDENT_AMBULATORY_CARE_PROVIDER_SITE_OTHER): Payer: Medicare Other | Admitting: Family Medicine

## 2019-01-30 DIAGNOSIS — F319 Bipolar disorder, unspecified: Secondary | ICD-10-CM

## 2019-01-30 DIAGNOSIS — W19XXXA Unspecified fall, initial encounter: Secondary | ICD-10-CM
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
## 2019-01-30 NOTE — Patient Instructions (Signed)
[facility] [address] [place], [place] [address] [phone]  [facility] [address] [place], [place] [address] [phone]  [facility] health [place], [place] [address] [phone]  [facility] [address], [place], [place], #[phone] [address], [place], [place], [place]  [facility] [place], [place] and [place], [place], [place]  [facility] [place], [place]  Call one of these offices sooner than later as it can take 2-3 months to get a new patient appointment.

## 2019-02-02 ENCOUNTER — Other Ambulatory Visit: Payer: Self-pay | Admitting: Family Medicine

## 2019-02-02 NOTE — Telephone Encounter (Signed)
Ok to fill both 

## 2019-02-02 NOTE — Telephone Encounter (Signed)
Defer these meds to Dr Agustin Cree, his cardiologist. Sena Hitch.

## 2019-02-03 DIAGNOSIS — Z89611 Acquired absence of right leg above knee: Secondary | ICD-10-CM | POA: Diagnosis not present

## 2019-02-03 DIAGNOSIS — G8929 Other chronic pain: Secondary | ICD-10-CM | POA: Diagnosis not present

## 2019-02-03 DIAGNOSIS — E1165 Type 2 diabetes mellitus with hyperglycemia: Secondary | ICD-10-CM | POA: Diagnosis not present

## 2019-02-03 DIAGNOSIS — I4891 Unspecified atrial fibrillation: Secondary | ICD-10-CM | POA: Diagnosis not present

## 2019-02-03 DIAGNOSIS — I10 Essential (primary) hypertension: Secondary | ICD-10-CM | POA: Diagnosis not present

## 2019-02-03 DIAGNOSIS — Z89612 Acquired absence of left leg above knee: Secondary | ICD-10-CM | POA: Diagnosis not present

## 2019-02-05 ENCOUNTER — Ambulatory Visit: Payer: Medicare Other | Admitting: Physical Therapy

## 2019-02-10 ENCOUNTER — Other Ambulatory Visit: Payer: Self-pay

## 2019-02-10 ENCOUNTER — Telehealth: Payer: Self-pay | Admitting: Family Medicine

## 2019-02-10 ENCOUNTER — Other Ambulatory Visit: Payer: Self-pay | Admitting: Family Medicine

## 2019-02-10 ENCOUNTER — Ambulatory Visit: Payer: Medicare Other | Attending: Family Medicine | Admitting: Physical Therapy

## 2019-02-10 DIAGNOSIS — M6249 Contracture of muscle, multiple sites: Secondary | ICD-10-CM

## 2019-02-10 DIAGNOSIS — M6281 Muscle weakness (generalized): Secondary | ICD-10-CM | POA: Insufficient documentation

## 2019-02-10 DIAGNOSIS — G546 Phantom limb syndrome with pain: Secondary | ICD-10-CM

## 2019-02-10 DIAGNOSIS — Z89611 Acquired absence of right leg above knee: Secondary | ICD-10-CM

## 2019-02-10 DIAGNOSIS — Z9181 History of falling: Secondary | ICD-10-CM | POA: Diagnosis not present

## 2019-02-10 DIAGNOSIS — F4312 Post-traumatic stress disorder, chronic: Secondary | ICD-10-CM

## 2019-02-10 DIAGNOSIS — F209 Schizophrenia, unspecified: Secondary | ICD-10-CM

## 2019-02-10 NOTE — Telephone Encounter (Signed)
OK to refer. He doesn't need an assessment from [place] prior to referring, correct?

## 2019-02-10 NOTE — Telephone Encounter (Signed)
Pharmacy called to get an updated med list due to wanting to start blister packs/ please advise

## 2019-02-10 NOTE — Telephone Encounter (Signed)
Pt came in office stating needing a referral for [facility] for an electric wheelchair - pt is needing an assessment firstref. Please advise. Pt tel [phone] or [phone].

## 2019-02-10 NOTE — Telephone Encounter (Signed)
Pharmacy is aware of list

## 2019-02-10 NOTE — Addendum Note (Signed)
Addended by: Sharon Seller B on: 02/10/2019 12:49 PM   Modules accepted: Orders
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
## 2019-02-10 NOTE — Telephone Encounter (Signed)
REFERRAL DONE

## 2019-02-10 NOTE — Telephone Encounter (Signed)
HHRN informed 

## 2019-02-10 NOTE — Telephone Encounter (Signed)
Called and was put on hold///finally requested to leave a message

## 2019-02-10 NOTE — Telephone Encounter (Signed)
Verbal authorization given to Debra at below number to continue home health x 1 week and discharge on 02/23/19. Please advise if any other recommendation?

## 2019-02-10 NOTE — Telephone Encounter (Signed)
New pharmacy

## 2019-02-10 NOTE — Telephone Encounter (Signed)
[name] [name] with [name] [name] is calling in to request to have home health services extended through the end of next week and discharge the week of 02/23/19.  She said that they planned to discharge this week but they have a few more things that is needed before discharging.   CB: [phone]

## 2019-02-10 NOTE — Telephone Encounter (Signed)
OK. No other recs. Ty.

## 2019-02-11 NOTE — Telephone Encounter (Signed)
Called the patient and he has appt scheduled with Behavioral Health on 03/09/2019

## 2019-02-11 NOTE — Telephone Encounter (Signed)
Find out his status in finding a psychiatrist please. Ty.

## 2019-02-16 ENCOUNTER — Other Ambulatory Visit: Payer: Self-pay

## 2019-02-16 ENCOUNTER — Ambulatory Visit: Payer: Medicare Other | Attending: Family Medicine | Admitting: Physical Therapy

## 2019-02-16 ENCOUNTER — Encounter: Payer: Self-pay | Admitting: Physical Therapy

## 2019-02-16 VITALS — BP 120/83 | HR 74

## 2019-02-16 DIAGNOSIS — M79604 Pain in right leg: Secondary | ICD-10-CM

## 2019-02-16 DIAGNOSIS — M545 Low back pain: Secondary | ICD-10-CM | POA: Diagnosis not present

## 2019-02-16 DIAGNOSIS — Z9181 History of falling: Secondary | ICD-10-CM | POA: Diagnosis not present

## 2019-02-16 DIAGNOSIS — M6281 Muscle weakness (generalized): Secondary | ICD-10-CM

## 2019-02-16 DIAGNOSIS — M25652 Stiffness of left hip, not elsewhere classified: Secondary | ICD-10-CM | POA: Diagnosis not present

## 2019-02-16 DIAGNOSIS — M6249 Contracture of muscle, multiple sites: Secondary | ICD-10-CM | POA: Diagnosis not present

## 2019-02-16 DIAGNOSIS — M79605 Pain in left leg: Secondary | ICD-10-CM

## 2019-02-16 DIAGNOSIS — G8929 Other chronic pain: Secondary | ICD-10-CM | POA: Diagnosis not present

## 2019-02-16 DIAGNOSIS — R293 Abnormal posture: Secondary | ICD-10-CM | POA: Diagnosis not present

## 2019-02-16 DIAGNOSIS — M25651 Stiffness of right hip, not elsewhere classified: Secondary | ICD-10-CM
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
## 2019-02-17 ENCOUNTER — Ambulatory Visit (INDEPENDENT_AMBULATORY_CARE_PROVIDER_SITE_OTHER): Payer: Medicare Other | Admitting: Cardiology

## 2019-02-17 ENCOUNTER — Encounter: Payer: Self-pay | Admitting: Cardiology

## 2019-02-17 VITALS — BP 122/66 | HR 95

## 2019-02-17 DIAGNOSIS — I420 Dilated cardiomyopathy: Secondary | ICD-10-CM | POA: Diagnosis not present

## 2019-02-17 DIAGNOSIS — I480 Paroxysmal atrial fibrillation: Secondary | ICD-10-CM

## 2019-02-17 DIAGNOSIS — J449 Chronic obstructive pulmonary disease, unspecified: Secondary | ICD-10-CM | POA: Diagnosis not present

## 2019-02-17 DIAGNOSIS — F209 Schizophrenia, unspecified: Secondary | ICD-10-CM

## 2019-02-17 DIAGNOSIS — Z89612 Acquired absence of left leg above knee: Secondary | ICD-10-CM | POA: Diagnosis not present

## 2019-02-17 DIAGNOSIS — Z8679 Personal history of other diseases of the circulatory system: Secondary | ICD-10-CM

## 2019-02-17 DIAGNOSIS — Z89611 Acquired absence of right leg above knee: Secondary | ICD-10-CM | POA: Diagnosis not present
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
## 2019-02-17 NOTE — Patient Instructions (Signed)
Medication Instructions:  Your physician recommends that you continue on your current medications as directed. Please refer to the Current Medication list given to you today.  If you need a refill on your cardiac medications before your next appointment, please call your pharmacy.   Lab work: Your physician recommends that you return for lab work today: bmp   If you have labs (blood work) drawn today and your tests are completely normal, you will receive your results only by: Marland Kitchen MyChart Message (if you have MyChart) OR . A paper copy in the mail If you have any lab test that is abnormal or we need to change your treatment, we will call you to review the results.  Testing/Procedures: None.   Follow-Up: At [facility], you and your health needs are our priority.  As part of our continuing mission to provide you with exceptional heart care, we have created designated Provider Care Teams.  These Care Teams include your primary Cardiologist (physician) and Advanced Practice Providers (APPs -  Physician Assistants and Nurse Practitioners) who all work together to provide you with the care you need, when you need it. You will need a follow up appointment in 2 weeks.  Please call our office 2 months in advance to schedule this appointment.  You may see No primary care provider on file. or another member of our [employer] Provider Team in [place]: Shirlee More, MD . Jyl Heinz, MD  Any Other Special Instructions Will Be Listed Below (If Applicable).

## 2019-02-18 LAB — BASIC METABOLIC PANEL
BUN/Creatinine Ratio: 19 (ref 10–24)
BUN: 12 mg/dL (ref 8–27)
CO2: 19 mmol/L — ABNORMAL LOW (ref 20–29)
Calcium: 9.4 mg/dL (ref 8.6–10.2)
Chloride: 101 mmol/L (ref 96–106)
Creatinine, Ser: 0.63 mg/dL — ABNORMAL LOW (ref 0.76–1.27)
GFR calc Af Amer: 122 mL/min/{1.73_m2} (ref >59)
GFR calc non Af Amer: 106 mL/min/{1.73_m2} (ref >59)
Glucose: 156 mg/dL — ABNORMAL HIGH (ref 65–99)
Potassium: 3.9 mmol/L (ref 3.5–5.2)
Sodium: 136 mmol/L (ref 134–144)

## 2019-02-19 ENCOUNTER — Ambulatory Visit: Payer: Medicare Other | Admitting: Physical Therapy

## 2019-02-19 ENCOUNTER — Other Ambulatory Visit: Payer: Self-pay

## 2019-02-20 ENCOUNTER — Telehealth: Payer: Self-pay | Admitting: Emergency Medicine

## 2019-02-20 DIAGNOSIS — I5021 Acute systolic (congestive) heart failure: Secondary | ICD-10-CM

## 2019-02-20 MED ORDER — ENTRESTO 24-26 MG PO TABS: 1.0000 | ORAL_TABLET | Freq: Two times a day (BID) | ORAL | 1 refills | Status: DC

## 2019-02-20 NOTE — Telephone Encounter (Signed)
Called patient informed him of lab results and to start entresto 24/26 mg twice daily. Patient also advised to have repeat labs 1 week after starting medication. Patient verbally understands. No further questions.

## 2019-02-24 ENCOUNTER — Ambulatory Visit: Payer: Medicare Other | Admitting: Physical Therapy

## 2019-02-26 ENCOUNTER — Ambulatory Visit: Payer: Medicare Other | Admitting: Physical Therapy

## 2019-02-26 ENCOUNTER — Other Ambulatory Visit: Payer: Self-pay

## 2019-02-26 DIAGNOSIS — M79605 Pain in left leg: Secondary | ICD-10-CM

## 2019-02-26 DIAGNOSIS — M6249 Contracture of muscle, multiple sites: Secondary | ICD-10-CM | POA: Diagnosis not present

## 2019-02-26 DIAGNOSIS — R293 Abnormal posture: Secondary | ICD-10-CM | POA: Diagnosis not present

## 2019-02-26 DIAGNOSIS — M25652 Stiffness of left hip, not elsewhere classified: Secondary | ICD-10-CM | POA: Diagnosis not present

## 2019-02-26 DIAGNOSIS — M6281 Muscle weakness (generalized): Secondary | ICD-10-CM

## 2019-02-26 DIAGNOSIS — Z9181 History of falling: Secondary | ICD-10-CM

## 2019-02-26 DIAGNOSIS — M25651 Stiffness of right hip, not elsewhere classified: Secondary | ICD-10-CM

## 2019-02-26 DIAGNOSIS — G8929 Other chronic pain: Secondary | ICD-10-CM

## 2019-02-26 DIAGNOSIS — M79604 Pain in right leg: Secondary | ICD-10-CM
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
## 2019-03-03 ENCOUNTER — Other Ambulatory Visit: Payer: Self-pay

## 2019-03-03 ENCOUNTER — Ambulatory Visit: Payer: Medicare Other | Admitting: Physical Therapy

## 2019-03-03 DIAGNOSIS — M6281 Muscle weakness (generalized): Secondary | ICD-10-CM | POA: Diagnosis not present

## 2019-03-03 DIAGNOSIS — Z9181 History of falling: Secondary | ICD-10-CM | POA: Diagnosis not present

## 2019-03-03 DIAGNOSIS — R293 Abnormal posture: Secondary | ICD-10-CM | POA: Diagnosis not present

## 2019-03-03 DIAGNOSIS — M6249 Contracture of muscle, multiple sites: Secondary | ICD-10-CM | POA: Diagnosis not present

## 2019-03-03 DIAGNOSIS — M545 Low back pain: Secondary | ICD-10-CM

## 2019-03-03 DIAGNOSIS — M25651 Stiffness of right hip, not elsewhere classified: Secondary | ICD-10-CM | POA: Diagnosis not present

## 2019-03-03 DIAGNOSIS — G8929 Other chronic pain: Secondary | ICD-10-CM

## 2019-03-03 DIAGNOSIS — M79605 Pain in left leg: Secondary | ICD-10-CM

## 2019-03-03 DIAGNOSIS — M79604 Pain in right leg: Secondary | ICD-10-CM

## 2019-03-03 DIAGNOSIS — M25652 Stiffness of left hip, not elsewhere classified: Secondary | ICD-10-CM | POA: Diagnosis not present
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
## 2019-03-04 ENCOUNTER — Encounter: Payer: Self-pay | Admitting: Family

## 2019-03-04 ENCOUNTER — Ambulatory Visit (INDEPENDENT_AMBULATORY_CARE_PROVIDER_SITE_OTHER): Payer: Medicare Other | Admitting: Family

## 2019-03-04 VITALS — BP 92/60 | HR 90 | Temp 97.0°F

## 2019-03-04 DIAGNOSIS — I480 Paroxysmal atrial fibrillation: Secondary | ICD-10-CM | POA: Diagnosis not present

## 2019-03-04 DIAGNOSIS — Z79899 Other long term (current) drug therapy: Secondary | ICD-10-CM

## 2019-03-04 DIAGNOSIS — I5022 Chronic systolic (congestive) heart failure: Secondary | ICD-10-CM | POA: Insufficient documentation

## 2019-03-04 DIAGNOSIS — I420 Dilated cardiomyopathy: Secondary | ICD-10-CM | POA: Diagnosis not present

## 2019-03-04 DIAGNOSIS — Z7901 Long term (current) use of anticoagulants: Secondary | ICD-10-CM

## 2019-03-04 DIAGNOSIS — Z8679 Personal history of other diseases of the circulatory system: Secondary | ICD-10-CM | POA: Diagnosis not present

## 2019-03-04 HISTORY — DX: Long term (current) use of anticoagulants: Z79.01

## 2019-03-04 HISTORY — DX: Other long term (current) drug therapy: Z79.899

## 2019-03-04 HISTORY — DX: Chronic systolic (congestive) heart failure: I50.22

## 2019-03-04 NOTE — Patient Instructions (Signed)
Medication Instructions:   STOP Losartan   CONTINUE Entresto one tablet TWICE daily   *If you need a refill on your cardiac medications before your next appointment, please call your pharmacy*  Lab Work: We will check your kidney function today.  If you have labs (blood work) drawn today and your tests are completely normal, you will receive your results only by: Marland Kitchen MyChart Message (if you have MyChart) OR . A paper copy in the mail If you have any lab test that is abnormal or we need to change your treatment, we will call you to review the results.  Testing/Procedures: None ordered today.  Follow-Up: At [facility], you and your health needs are our priority.  As part of our continuing mission to provide you with exceptional heart care, we have created designated Provider Care Teams.  These Care Teams include your primary Cardiologist (physician) and Advanced Practice Providers (APPs -  Physician Assistants and Nurse Practitioners) who all work together to provide you with the care you need, when you need it.  Your next appointment:   6-8 weeks  The format for your next appointment:   In Person  Provider:   Jenne Campus, MD  Other Instructions  It was a pleasure meeting you today! I will discuss with Dr. Nani Ravens how we can best get you set up for a CNA at home. Hopefully he will have an update for you at your upcoming office visit.   Be sure to follow a low salt diet. If you notice new shortness of breath, chest pain, swelling in your hands, palpitations, fast heart beats.   Best, Loel Dubonnet, NP

## 2019-03-05 ENCOUNTER — Ambulatory Visit: Payer: Medicare Other | Admitting: Physical Therapy

## 2019-03-05 ENCOUNTER — Other Ambulatory Visit: Payer: Self-pay

## 2019-03-05 DIAGNOSIS — M6281 Muscle weakness (generalized): Secondary | ICD-10-CM

## 2019-03-05 DIAGNOSIS — R293 Abnormal posture: Secondary | ICD-10-CM

## 2019-03-05 DIAGNOSIS — M79604 Pain in right leg: Secondary | ICD-10-CM

## 2019-03-05 DIAGNOSIS — Z9181 History of falling: Secondary | ICD-10-CM

## 2019-03-05 DIAGNOSIS — M25652 Stiffness of left hip, not elsewhere classified: Secondary | ICD-10-CM

## 2019-03-05 DIAGNOSIS — M6249 Contracture of muscle, multiple sites: Secondary | ICD-10-CM | POA: Diagnosis not present

## 2019-03-05 DIAGNOSIS — G8929 Other chronic pain: Secondary | ICD-10-CM

## 2019-03-05 DIAGNOSIS — M79605 Pain in left leg: Secondary | ICD-10-CM

## 2019-03-05 DIAGNOSIS — M25651 Stiffness of right hip, not elsewhere classified: Secondary | ICD-10-CM | POA: Diagnosis not present

## 2019-03-05 LAB — BASIC METABOLIC PANEL
BUN/Creatinine Ratio: 12 (ref 10–24)
BUN: 8 mg/dL (ref 8–27)
CO2: 16 mmol/L — ABNORMAL LOW (ref 20–29)
Calcium: 9.2 mg/dL (ref 8.6–10.2)
Chloride: 101 mmol/L (ref 96–106)
Creatinine, Ser: 0.67 mg/dL — ABNORMAL LOW (ref 0.76–1.27)
GFR calc Af Amer: 119 mL/min/{1.73_m2} (ref >59)
GFR calc non Af Amer: 103 mL/min/{1.73_m2} (ref >59)
Glucose: 192 mg/dL — ABNORMAL HIGH (ref 65–99)
Potassium: 4.2 mmol/L (ref 3.5–5.2)
Sodium: 136 mmol/L (ref 134–144)
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
## 2019-03-06 ENCOUNTER — Other Ambulatory Visit: Payer: Self-pay

## 2019-03-07 ENCOUNTER — Ambulatory Visit (HOSPITAL_BASED_OUTPATIENT_CLINIC_OR_DEPARTMENT_OTHER)
Admission: RE | Admit: 2019-03-07 | Discharge: 2019-03-07 | Disposition: A | Discharge status: Home / Self Care | Payer: Medicare Other | Source: Ambulatory Visit | Attending: Family Medicine | Admitting: Family Medicine

## 2019-03-07 DIAGNOSIS — R1907 Generalized intra-abdominal and pelvic swelling, mass and lump: Secondary | ICD-10-CM | POA: Diagnosis not present

## 2019-03-07 DIAGNOSIS — R1900 Intra-abdominal and pelvic swelling, mass and lump, unspecified site: Secondary | ICD-10-CM | POA: Diagnosis not present

## 2019-03-07 MED ORDER — GADOBUTROL 1 MMOL/ML IV SOLN
10.0000 mL | Freq: Once | INTRAVENOUS | Status: AC | PRN
  Administered 2019-03-07: 10 mL via INTRAVENOUS

## 2019-03-09 ENCOUNTER — Other Ambulatory Visit: Payer: Self-pay | Admitting: Family Medicine

## 2019-03-09 ENCOUNTER — Other Ambulatory Visit: Payer: Self-pay

## 2019-03-09 ENCOUNTER — Encounter: Payer: Self-pay | Admitting: Family Medicine

## 2019-03-09 ENCOUNTER — Encounter (HOSPITAL_COMMUNITY): Payer: Medicare Other | Admitting: Psychiatry

## 2019-03-09 ENCOUNTER — Ambulatory Visit (INDEPENDENT_AMBULATORY_CARE_PROVIDER_SITE_OTHER): Payer: Medicare Other | Admitting: Family Medicine

## 2019-03-09 ENCOUNTER — Telehealth: Payer: Self-pay | Admitting: Family Medicine

## 2019-03-09 VITALS — BP 107/57 | HR 79 | Temp 97.6°F

## 2019-03-09 DIAGNOSIS — E782 Mixed hyperlipidemia: Secondary | ICD-10-CM

## 2019-03-09 DIAGNOSIS — E1165 Type 2 diabetes mellitus with hyperglycemia: Secondary | ICD-10-CM | POA: Diagnosis not present

## 2019-03-09 DIAGNOSIS — M79605 Pain in left leg: Secondary | ICD-10-CM

## 2019-03-09 DIAGNOSIS — Z89611 Acquired absence of right leg above knee: Secondary | ICD-10-CM | POA: Diagnosis not present

## 2019-03-09 DIAGNOSIS — G546 Phantom limb syndrome with pain: Secondary | ICD-10-CM

## 2019-03-09 DIAGNOSIS — R222 Localized swelling, mass and lump, trunk: Secondary | ICD-10-CM | POA: Diagnosis not present

## 2019-03-09 DIAGNOSIS — Z89612 Acquired absence of left leg above knee: Secondary | ICD-10-CM | POA: Diagnosis not present

## 2019-03-09 DIAGNOSIS — M79604 Pain in right leg: Secondary | ICD-10-CM

## 2019-03-09 DIAGNOSIS — R2230 Localized swelling, mass and lump, unspecified upper limb: Secondary | ICD-10-CM

## 2019-03-09 LAB — LIPID PANEL
Cholesterol: 302 mg/dL — ABNORMAL HIGH (ref 0–200)
HDL: 41.9 mg/dL (ref >39.00)
LDL Cholesterol: 224 mg/dL — ABNORMAL HIGH (ref 0–99)
NonHDL: 259.66
Total CHOL/HDL Ratio: 7
Triglycerides: 178 mg/dL — ABNORMAL HIGH (ref 0.0–149.0)
VLDL: 35.6 mg/dL (ref 0.0–40.0)

## 2019-03-09 LAB — HEMOGLOBIN A1C: Hgb A1c MFr Bld: 7.3 % — ABNORMAL HIGH (ref 4.6–6.5)

## 2019-03-09 MED ORDER — ATORVASTATIN CALCIUM 40 MG PO TABS: 40.0000 mg | ORAL_TABLET | Freq: Every day | ORAL | 3 refills | Status: DC
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
## 2019-03-09 NOTE — Telephone Encounter (Signed)
OK to place if needed, but the referral was placed in July. Ty.

## 2019-03-09 NOTE — Patient Instructions (Addendum)
[phone] for your orthopedic specialist visit.  Keep the diet clean and stay active.   Give [place] 2-3 business days to get the results of your labs back.   Find out what your insurance will cover for glucometers and let us know.   Call your eye provider for an appointment.   Let us know if you need anything.
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
## 2019-03-09 NOTE — Telephone Encounter (Signed)
Referral done

## 2019-03-09 NOTE — Telephone Encounter (Signed)
Copied from [place] [phone]. Topic: General - Other >> Mar 09, 2019  9:56 AM Carolyn Stare wrote: Pt saw the doctor today and they contacted the ortopedic per [name] req and told they need a referral  [place] to do but done already per [facility]?

## 2019-03-10 ENCOUNTER — Other Ambulatory Visit: Payer: Self-pay | Admitting: Cardiology

## 2019-03-10 ENCOUNTER — Other Ambulatory Visit: Payer: Self-pay | Admitting: Family Medicine

## 2019-03-10 ENCOUNTER — Encounter: Payer: Medicare Other | Admitting: Physical Therapy

## 2019-03-10 DIAGNOSIS — E782 Mixed hyperlipidemia: Secondary | ICD-10-CM

## 2019-03-11 ENCOUNTER — Other Ambulatory Visit: Payer: Self-pay

## 2019-03-11 ENCOUNTER — Ambulatory Visit: Payer: Medicare Other | Attending: Family Medicine | Admitting: Physical Therapy

## 2019-03-11 ENCOUNTER — Encounter: Payer: Self-pay | Admitting: Physical Therapy

## 2019-03-11 ENCOUNTER — Telehealth: Payer: Self-pay | Admitting: Cardiology

## 2019-03-11 DIAGNOSIS — G8929 Other chronic pain: Secondary | ICD-10-CM | POA: Diagnosis not present

## 2019-03-11 DIAGNOSIS — M6249 Contracture of muscle, multiple sites: Secondary | ICD-10-CM | POA: Insufficient documentation

## 2019-03-11 DIAGNOSIS — M6281 Muscle weakness (generalized): Secondary | ICD-10-CM | POA: Diagnosis not present

## 2019-03-11 DIAGNOSIS — M25652 Stiffness of left hip, not elsewhere classified: Secondary | ICD-10-CM | POA: Insufficient documentation

## 2019-03-11 DIAGNOSIS — M545 Low back pain: Secondary | ICD-10-CM | POA: Insufficient documentation

## 2019-03-11 DIAGNOSIS — R2681 Unsteadiness on feet: Secondary | ICD-10-CM | POA: Insufficient documentation

## 2019-03-11 DIAGNOSIS — M25651 Stiffness of right hip, not elsewhere classified: Secondary | ICD-10-CM | POA: Diagnosis not present

## 2019-03-11 DIAGNOSIS — R2689 Other abnormalities of gait and mobility: Secondary | ICD-10-CM

## 2019-03-11 DIAGNOSIS — R293 Abnormal posture: Secondary | ICD-10-CM | POA: Diagnosis not present

## 2019-03-11 NOTE — Telephone Encounter (Signed)
Called patient. I informed him he was taken off losartan and started on entresto to we wouldn't be refilling losartan. Patient verbally understood, he was confused and actually doesn't need a refill. Advised him to call us with any other concerns, he verbally understands.

## 2019-03-11 NOTE — Patient Instructions (Addendum)
Access Code: [email or web address]  URL: [email or web address]  Date: 03/11/2019  Prepared by: Jamey Reas   Exercises Prone Gluteal Sets - 10-15 reps - 1 sets - 5 seconds hold - 1-2x daily - 7x weekly Prone Hip Flexor Stretch with Towel Roll (AKA) - 1 reps - 1 sets - 10 minutes hold - 1-2x daily - 7x weekly Prone Hip Extension with Residual Limb (AKA) - 10-15 reps - 1 sets - 5 seconds hold - 1-2x daily - 7x weekly Sidelying hip extension - 10-15 reps - 1 sets - 5 seconds hold - 1-2x daily - 7x weekly Sidelying Hip Abduction - 10-15 reps - 1-2 sets - 5 seconds hold - 1x daily - 7x weekly Beginner Single Leg Circle - 10-15 reps - 1 sets - 5 seconds hold - 1-2x daily - 7x weekly Supine Abdominal Crunch - Hands Behind Head (AKA) - 10-15 reps - 1 sets - 5 seconds hold - 1-2x daily - 7x weekly Supine Psoas Stretch (AKA) - 10-15 reps - 1 sets - 5 seconds hold - 1-2x daily - 7x weekly

## 2019-03-11 NOTE — Telephone Encounter (Signed)
°*  STAT* If patient is at the pharmacy, call can be transferred to refill team.   1. Which medications need to be refilled? (please list name of each medication and dose if known) Losartan potassium 25mg   2. Which pharmacy/location (including street and city if local pharmacy) is medication to be sent to?[place]  3. Do they need a 30 day or 90 day supply? [place]
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
## 2019-03-12 ENCOUNTER — Encounter: Payer: Medicare Other | Admitting: Physical Therapy

## 2019-03-12 ENCOUNTER — Other Ambulatory Visit: Payer: Self-pay | Admitting: Family Medicine

## 2019-03-16 ENCOUNTER — Ambulatory Visit: Payer: Medicare Other | Admitting: Physical Therapy

## 2019-03-17 ENCOUNTER — Other Ambulatory Visit: Payer: Self-pay | Admitting: Family Medicine

## 2019-03-17 ENCOUNTER — Encounter: Payer: Medicare Other | Admitting: Physical Therapy

## 2019-03-17 DIAGNOSIS — F4312 Post-traumatic stress disorder, chronic: Secondary | ICD-10-CM

## 2019-03-17 DIAGNOSIS — F209 Schizophrenia, unspecified: Secondary | ICD-10-CM

## 2019-03-18 ENCOUNTER — Other Ambulatory Visit: Payer: Self-pay | Admitting: Family Medicine

## 2019-03-18 ENCOUNTER — Ambulatory Visit: Payer: Medicare Other | Admitting: Physical Therapy

## 2019-03-18 DIAGNOSIS — R2230 Localized swelling, mass and lump, unspecified upper limb: Secondary | ICD-10-CM

## 2019-03-18 DIAGNOSIS — R222 Localized swelling, mass and lump, trunk: Secondary | ICD-10-CM

## 2019-03-19 ENCOUNTER — Telehealth: Payer: Self-pay

## 2019-03-19 DIAGNOSIS — Z89611 Acquired absence of right leg above knee: Secondary | ICD-10-CM

## 2019-03-19 DIAGNOSIS — Z89612 Acquired absence of left leg above knee: Secondary | ICD-10-CM

## 2019-03-19 NOTE — Telephone Encounter (Signed)
Copied from [place] [id number]. Topic: Referral - Status >> Mar 19, 2019 11:01 AM Yvette Rack wrote: Reason for CRM: Pt stated he was told that a new referral is needed for his treatment appts from 03/23/19 - 04/28/19. Pt stated this will  also help with transportation to his appts. Pt requests call back

## 2019-03-20 NOTE — Telephone Encounter (Signed)
New referral to what/whom? OK to place if it is PT.

## 2019-03-20 NOTE — Telephone Encounter (Signed)
Called left message to call us back with info on who/what referral needs to be made to.

## 2019-03-23 ENCOUNTER — Ambulatory Visit: Payer: Medicare Other | Admitting: Physical Therapy

## 2019-03-23 ENCOUNTER — Telehealth: Payer: Self-pay | Admitting: Family Medicine

## 2019-03-23 NOTE — Telephone Encounter (Signed)
Spoke to the patient and he is not sure what/where referral is needed. He will call us back with info needed once he speaks to the friend who helps with his medical needs.

## 2019-03-23 NOTE — Telephone Encounter (Signed)
Copied from [place] [phone]. Topic: General - Other >> Mar 23, 2019  2:54 PM Celene Kras wrote: Reason for CRM: Pt and Pts friend called and are requesting to have his [facility] orders sent to [place]. Pt gave verbal confirmation that it is okay to call and speak with Hulen Skains. Please advise.   Will order/referral see telephone note

## 2019-03-23 NOTE — Telephone Encounter (Signed)
Called back and would like to be referred to [facility] in [facility] Referral entered

## 2019-03-23 NOTE — Addendum Note (Signed)
Addended by: Sharon Seller B on: 03/23/2019 03:21 PM   Modules accepted: Orders

## 2019-03-24 ENCOUNTER — Encounter: Payer: Medicare Other | Admitting: Physical Therapy

## 2019-03-25 ENCOUNTER — Ambulatory Visit: Payer: Medicare Other | Admitting: Physical Therapy

## 2019-03-30 ENCOUNTER — Ambulatory Visit: Payer: Medicare Other | Admitting: Physical Therapy

## 2019-03-30 ENCOUNTER — Encounter: Payer: Self-pay | Admitting: Physical Therapy

## 2019-03-30 ENCOUNTER — Other Ambulatory Visit: Payer: Self-pay

## 2019-03-30 DIAGNOSIS — R293 Abnormal posture: Secondary | ICD-10-CM | POA: Diagnosis not present

## 2019-03-30 DIAGNOSIS — M25652 Stiffness of left hip, not elsewhere classified: Secondary | ICD-10-CM | POA: Diagnosis not present

## 2019-03-30 DIAGNOSIS — M6281 Muscle weakness (generalized): Secondary | ICD-10-CM | POA: Diagnosis not present

## 2019-03-30 DIAGNOSIS — M545 Low back pain, unspecified: Secondary | ICD-10-CM

## 2019-03-30 DIAGNOSIS — R2689 Other abnormalities of gait and mobility: Secondary | ICD-10-CM

## 2019-03-30 DIAGNOSIS — M25651 Stiffness of right hip, not elsewhere classified: Secondary | ICD-10-CM

## 2019-03-30 DIAGNOSIS — R2681 Unsteadiness on feet: Secondary | ICD-10-CM

## 2019-03-30 DIAGNOSIS — M6249 Contracture of muscle, multiple sites: Secondary | ICD-10-CM

## 2019-03-30 DIAGNOSIS — G8929 Other chronic pain: Secondary | ICD-10-CM
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
## 2019-04-06 ENCOUNTER — Encounter: Payer: Self-pay | Admitting: Physical Therapy

## 2019-04-06 ENCOUNTER — Other Ambulatory Visit: Payer: Self-pay | Admitting: Family Medicine

## 2019-04-06 ENCOUNTER — Other Ambulatory Visit: Payer: Self-pay

## 2019-04-06 ENCOUNTER — Ambulatory Visit: Payer: Medicare Other | Admitting: Physical Therapy

## 2019-04-06 ENCOUNTER — Other Ambulatory Visit: Payer: Self-pay | Admitting: Cardiology

## 2019-04-06 ENCOUNTER — Ambulatory Visit
Admission: RE | Admit: 2019-04-06 | Discharge: 2019-04-06 | Disposition: A | Discharge status: Home / Self Care | Payer: Medicare Other | Source: Ambulatory Visit | Attending: Family Medicine | Admitting: Family Medicine

## 2019-04-06 ENCOUNTER — Ambulatory Visit: Payer: Medicare Other

## 2019-04-06 DIAGNOSIS — M6281 Muscle weakness (generalized): Secondary | ICD-10-CM

## 2019-04-06 DIAGNOSIS — M25652 Stiffness of left hip, not elsewhere classified: Secondary | ICD-10-CM | POA: Diagnosis not present

## 2019-04-06 DIAGNOSIS — R2689 Other abnormalities of gait and mobility: Secondary | ICD-10-CM | POA: Diagnosis not present

## 2019-04-06 DIAGNOSIS — R293 Abnormal posture: Secondary | ICD-10-CM

## 2019-04-06 DIAGNOSIS — R222 Localized swelling, mass and lump, trunk: Secondary | ICD-10-CM

## 2019-04-06 DIAGNOSIS — R2681 Unsteadiness on feet: Secondary | ICD-10-CM | POA: Diagnosis not present

## 2019-04-06 DIAGNOSIS — R2230 Localized swelling, mass and lump, unspecified upper limb: Secondary | ICD-10-CM

## 2019-04-06 DIAGNOSIS — R928 Other abnormal and inconclusive findings on diagnostic imaging of breast: Secondary | ICD-10-CM | POA: Diagnosis not present

## 2019-04-06 DIAGNOSIS — M25651 Stiffness of right hip, not elsewhere classified: Secondary | ICD-10-CM | POA: Diagnosis not present

## 2019-04-06 NOTE — Patient Instructions (Addendum)
Access Code: [email or web address]       URL: [email or web address]    Date: 03/11/2019  Prepared by: Jamey Reas   Exercises Prone Gluteal Sets - 10-15 reps - 1 sets - 5 seconds hold - 1-2x daily - 7x weekly Prone Hip Flexor Stretch with Towel Roll (AKA) - 1 reps - 1 sets - 10 minutes hold - 1-2x daily - 7x weekly Prone Hip Extension with Residual Limb (AKA) - 10-15 reps - 1 sets - 5 seconds hold - 1-2x daily - 7x weekly Sidelying hip extension - 10-15 reps - 1 sets - 5 seconds hold - cues on not rotating trunk or hip Sidelying Hip Abduction - 10-15 reps - 1-2 sets - 5 seconds hold - visual cue to place foot on back of couch Sidelying pelvic depression- hip extension then hip abduction followed by pelvic depression.  Beginner Single Leg Circle - 10-15 reps - 1 sets - 5 seconds hold - focus on bottom half of circle. Go both directions.  Supine Abdominal Crunch - Hands Behind Head (AKA) - 10-15 reps - 1 sets - 5 seconds hold - 1-2x daily - 7x weekly Supine Psoas Stretch (AKA) - 10-15 reps - 1 sets - 5 seconds hold - start position is LEs to chest to decrease lordosis and maintain one LE to chest to decrease arching back.   Clock (center of clock is pelvis, head is at 12:00)  Moving pelvis with focus on buttocks / sit bone pushing downward & front of pelvis (headlight) upward. 1. Laying on right side: move pelvis forward to 11:00 and buttocks down towards 5:00. After 10 reps of pelvis add moving residual limb (think reaching foot) 2. Laying on left side: move pelvis forward to 1:00 and buttocks down towards 7:00. After 10 reps of pelvis add moving residual limb (think reaching foot)
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
## 2019-04-07 ENCOUNTER — Encounter: Payer: Medicare Other | Admitting: Physical Therapy

## 2019-04-08 ENCOUNTER — Ambulatory Visit: Payer: Medicare Other | Attending: Family Medicine | Admitting: Physical Therapy

## 2019-04-08 ENCOUNTER — Other Ambulatory Visit: Payer: Self-pay

## 2019-04-08 ENCOUNTER — Encounter: Payer: Self-pay | Admitting: Physical Therapy

## 2019-04-08 DIAGNOSIS — M25651 Stiffness of right hip, not elsewhere classified: Secondary | ICD-10-CM | POA: Insufficient documentation

## 2019-04-08 DIAGNOSIS — R293 Abnormal posture: Secondary | ICD-10-CM | POA: Insufficient documentation

## 2019-04-08 DIAGNOSIS — R2689 Other abnormalities of gait and mobility: Secondary | ICD-10-CM | POA: Insufficient documentation

## 2019-04-08 DIAGNOSIS — R2681 Unsteadiness on feet: Secondary | ICD-10-CM | POA: Diagnosis present

## 2019-04-08 DIAGNOSIS — M6281 Muscle weakness (generalized): Secondary | ICD-10-CM | POA: Insufficient documentation

## 2019-04-08 DIAGNOSIS — M25652 Stiffness of left hip, not elsewhere classified: Secondary | ICD-10-CM | POA: Insufficient documentation
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
## 2019-04-09 ENCOUNTER — Encounter: Payer: Medicare Other | Admitting: Physical Therapy

## 2019-04-13 ENCOUNTER — Encounter: Payer: Self-pay | Admitting: Physical Therapy

## 2019-04-13 ENCOUNTER — Ambulatory Visit (INDEPENDENT_AMBULATORY_CARE_PROVIDER_SITE_OTHER): Payer: Medicare Other | Admitting: Internal Medicine

## 2019-04-13 ENCOUNTER — Ambulatory Visit (HOSPITAL_BASED_OUTPATIENT_CLINIC_OR_DEPARTMENT_OTHER)
Admission: RE | Admit: 2019-04-13 | Discharge: 2019-04-13 | Disposition: A | Discharge status: Home / Self Care | Payer: Medicare Other | Source: Ambulatory Visit | Attending: Internal Medicine | Admitting: Internal Medicine

## 2019-04-13 ENCOUNTER — Other Ambulatory Visit: Payer: Self-pay

## 2019-04-13 ENCOUNTER — Ambulatory Visit: Payer: Medicare Other | Admitting: Physical Therapy

## 2019-04-13 ENCOUNTER — Telehealth: Payer: Self-pay | Admitting: Family Medicine

## 2019-04-13 DIAGNOSIS — R2689 Other abnormalities of gait and mobility: Secondary | ICD-10-CM

## 2019-04-13 DIAGNOSIS — M25651 Stiffness of right hip, not elsewhere classified: Secondary | ICD-10-CM

## 2019-04-13 DIAGNOSIS — S79912A Unspecified injury of left hip, initial encounter: Secondary | ICD-10-CM | POA: Diagnosis not present

## 2019-04-13 DIAGNOSIS — R293 Abnormal posture: Secondary | ICD-10-CM

## 2019-04-13 DIAGNOSIS — M810 Age-related osteoporosis without current pathological fracture: Secondary | ICD-10-CM | POA: Diagnosis not present

## 2019-04-13 DIAGNOSIS — W19XXXA Unspecified fall, initial encounter: Secondary | ICD-10-CM

## 2019-04-13 DIAGNOSIS — M6281 Muscle weakness (generalized): Secondary | ICD-10-CM

## 2019-04-13 DIAGNOSIS — M25552 Pain in left hip: Secondary | ICD-10-CM | POA: Diagnosis not present

## 2019-04-13 DIAGNOSIS — Z89612 Acquired absence of left leg above knee: Secondary | ICD-10-CM

## 2019-04-13 DIAGNOSIS — Z89611 Acquired absence of right leg above knee: Secondary | ICD-10-CM | POA: Diagnosis not present

## 2019-04-13 DIAGNOSIS — M25652 Stiffness of left hip, not elsewhere classified: Secondary | ICD-10-CM

## 2019-04-13 DIAGNOSIS — M79652 Pain in left thigh: Secondary | ICD-10-CM

## 2019-04-13 DIAGNOSIS — Z89512 Acquired absence of left leg below knee: Secondary | ICD-10-CM | POA: Insufficient documentation

## 2019-04-13 DIAGNOSIS — R2681 Unsteadiness on feet: Secondary | ICD-10-CM

## 2019-04-13 DIAGNOSIS — S79922A Unspecified injury of left thigh, initial encounter: Secondary | ICD-10-CM | POA: Diagnosis not present
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
## 2019-04-14 ENCOUNTER — Encounter: Payer: Medicare Other | Admitting: Physical Therapy

## 2019-04-14 ENCOUNTER — Telehealth: Payer: Self-pay | Admitting: Family Medicine

## 2019-04-14 NOTE — Telephone Encounter (Signed)
Copied from [facility] [id number]. Topic: Quick Communication - See Telephone Encounter >> Apr 14, 2019  4:04 PM Loma [place] wrote: CRM for notification. See Telephone encounter for: 04/14/19. [facility] called and states that the caregiver said one of pt meds were discontinued but the caregiver does not know which one. Please call Benjamine Mola back at [facility] pharmacist and clarified that on 03/11/2019 (see telephone message) Cardiologist removed losartan from the medications taking and started the patient on Entresto. The pharmacist updated their profile for this patient//and losartan removed from his medication list in his chart.

## 2019-04-15 ENCOUNTER — Ambulatory Visit: Payer: Medicare Other | Admitting: Physical Therapy

## 2019-04-16 ENCOUNTER — Encounter: Payer: Medicare Other | Admitting: Physical Therapy

## 2019-04-20 ENCOUNTER — Ambulatory Visit: Payer: Medicare Other | Admitting: Physical Therapy

## 2019-04-20 ENCOUNTER — Other Ambulatory Visit: Payer: Self-pay

## 2019-04-20 ENCOUNTER — Encounter: Payer: Self-pay | Admitting: Physical Therapy

## 2019-04-20 DIAGNOSIS — R2689 Other abnormalities of gait and mobility: Secondary | ICD-10-CM

## 2019-04-20 DIAGNOSIS — R293 Abnormal posture: Secondary | ICD-10-CM

## 2019-04-20 DIAGNOSIS — M6281 Muscle weakness (generalized): Secondary | ICD-10-CM

## 2019-04-20 DIAGNOSIS — R2681 Unsteadiness on feet: Secondary | ICD-10-CM
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
## 2019-04-21 ENCOUNTER — Encounter: Payer: Medicare Other | Admitting: Physical Therapy

## 2019-04-23 ENCOUNTER — Encounter: Payer: Medicare Other | Admitting: Physical Therapy

## 2019-04-24 ENCOUNTER — Ambulatory Visit: Payer: Medicare Other | Admitting: Physical Therapy

## 2019-04-24 ENCOUNTER — Other Ambulatory Visit: Payer: Self-pay

## 2019-04-24 ENCOUNTER — Encounter: Payer: Self-pay | Admitting: Physical Therapy

## 2019-04-24 DIAGNOSIS — R2689 Other abnormalities of gait and mobility: Secondary | ICD-10-CM | POA: Diagnosis not present

## 2019-04-24 DIAGNOSIS — M6281 Muscle weakness (generalized): Secondary | ICD-10-CM

## 2019-04-24 DIAGNOSIS — R293 Abnormal posture: Secondary | ICD-10-CM

## 2019-04-24 DIAGNOSIS — M25651 Stiffness of right hip, not elsewhere classified: Secondary | ICD-10-CM

## 2019-04-24 NOTE — Patient Instructions (Signed)
Access Code: [email or web address]  URL: [email or web address]  Date: 04/24/2019  Prepared by: Willow Ora   Exercises Prone Gluteal Sets - 10-15 reps - 1 sets - 5 seconds hold - 1-2x daily - 7x weekly Prone Hip Flexor Stretch with Towel Roll (AKA) - 1 reps - 1 sets - 10 minutes hold - 1-2x daily - 7x weekly Prone Hip Extension with Residual Limb (AKA) - 10-15 reps - 1 sets - 5 seconds hold - 1-2x daily - 7x weekly Sidelying hip extension - 10-15 reps - 1 sets - 5 seconds hold - 1-2x daily - 7x weekly Sidelying Hip Abduction - 10-15 reps - 1-2 sets - 5 seconds hold - 1x daily - 7x weekly Beginner Single Leg Circle - 10-15 reps - 1 sets - 5 seconds hold - 1-2x daily - 7x weekly Curl Up with Reach - 10 reps - 1 sets - 1x daily - 5x weekly Supine Psoas Stretch (AKA) - 10-15 reps - 1 sets - 5 seconds hold - 1-2x daily - 7x weekly
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
## 2019-04-27 ENCOUNTER — Ambulatory Visit (INDEPENDENT_AMBULATORY_CARE_PROVIDER_SITE_OTHER): Payer: Medicare Other | Admitting: Cardiology

## 2019-04-27 ENCOUNTER — Encounter: Payer: Self-pay | Admitting: Cardiology

## 2019-04-27 ENCOUNTER — Other Ambulatory Visit: Payer: Self-pay

## 2019-04-27 VITALS — BP 124/86 | HR 88

## 2019-04-27 DIAGNOSIS — Z1329 Encounter for screening for other suspected endocrine disorder: Secondary | ICD-10-CM

## 2019-04-27 DIAGNOSIS — I420 Dilated cardiomyopathy: Secondary | ICD-10-CM

## 2019-04-27 DIAGNOSIS — E119 Type 2 diabetes mellitus without complications: Secondary | ICD-10-CM

## 2019-04-27 DIAGNOSIS — F17210 Nicotine dependence, cigarettes, uncomplicated: Secondary | ICD-10-CM

## 2019-04-27 DIAGNOSIS — Z7901 Long term (current) use of anticoagulants: Secondary | ICD-10-CM

## 2019-04-27 DIAGNOSIS — E1165 Type 2 diabetes mellitus with hyperglycemia: Secondary | ICD-10-CM

## 2019-04-27 DIAGNOSIS — I480 Paroxysmal atrial fibrillation: Secondary | ICD-10-CM | POA: Diagnosis not present

## 2019-04-27 HISTORY — DX: Nicotine dependence, cigarettes, uncomplicated: F17.210
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
## 2019-04-27 NOTE — Patient Instructions (Signed)
Medication Instructions:  Your physician recommends that you continue on your current medications as directed. Please refer to the Current Medication list given to you today.  *If you need a refill on your cardiac medications before your next appointment, please call your pharmacy*  Lab Work: Your physician recommends that you have a BMP, TSH and hepatic drawn today  If you have labs (blood work) drawn today and your tests are completely normal, you will receive your results only by: Marland Kitchen MyChart Message (if you have MyChart) OR . A paper copy in the mail If you have any lab test that is abnormal or we need to change your treatment, we will call you to review the results.  Testing/Procedures: Your physician has requested that you have an echocardiogram. Echocardiography is a painless test that uses sound waves to create images of your heart. It provides your doctor with information about the size and shape of your heart and how well your heart's chambers and valves are working. This procedure takes approximately one hour. There are no restrictions for this procedure.    Follow-Up: At [facility], you and your health needs are our priority.  As part of our continuing mission to provide you with exceptional heart care, we have created designated Provider Care Teams.  These Care Teams include your primary Cardiologist (physician) and Advanced Practice Providers (APPs -  Physician Assistants and Nurse Practitioners) who all work together to provide you with the care you need, when you need it.  Your next appointment:   4 month(s)  The format for your next appointment:   In Person  Provider:   Belva Crome, MD  Other Instructions  Echocardiogram An echocardiogram is a procedure that uses painless sound waves (ultrasound) to produce an image of the heart. Images from an echocardiogram can provide important information about:  Signs of coronary artery disease (CAD).  Aneurysm  detection. An aneurysm is a weak or damaged part of an artery wall that bulges out from the normal force of blood pumping through the body.  Heart size and shape. Changes in the size or shape of the heart can be associated with certain conditions, including heart failure, aneurysm, and CAD.  Heart muscle function.  Heart valve function.  Signs of a past heart attack.  Fluid buildup around the heart.  Thickening of the heart muscle.  A tumor or infectious growth around the heart valves. Tell a health care provider about:  Any allergies you have.  All medicines you are taking, including vitamins, herbs, eye drops, creams, and over-the-counter medicines.  Any blood disorders you have.  Any surgeries you have had.  Any medical conditions you have.  Whether you are pregnant or may be pregnant. What are the risks? Generally, this is a safe procedure. However, problems may occur, including:  Allergic reaction to dye (contrast) that may be used during the procedure. What happens before the procedure? No specific preparation is needed. You may eat and drink normally. What happens during the procedure?   An IV tube may be inserted into one of your veins.  You may receive contrast through this tube. A contrast is an injection that improves the quality of the pictures from your heart.  A gel will be applied to your chest.  A wand-like tool (transducer) will be moved over your chest. The gel will help to transmit the sound waves from the transducer.  The sound waves will harmlessly bounce off of your heart to allow the heart images  to be captured in real-time motion. The images will be recorded on a computer. The procedure may vary among health care providers and hospitals. What happens after the procedure?  You may return to your normal, everyday life, including diet, activities, and medicines, unless your health care provider tells you not to do that. Summary  An  echocardiogram is a procedure that uses painless sound waves (ultrasound) to produce an image of the heart.  Images from an echocardiogram can provide important information about the size and shape of your heart, heart muscle function, heart valve function, and fluid buildup around your heart.  You do not need to do anything to prepare before this procedure. You may eat and drink normally.  After the echocardiogram is completed, you may return to your normal, everyday life, unless your health care provider tells you not to do that. This information is not intended to replace advice given to you by your health care provider. Make sure you discuss any questions you have with your health care provider. Document Released: 04/20/2000 Document Revised: 08/14/2018 Document Reviewed: 05/26/2016 Elsevier Patient Education  2020 [employer].

## 2019-04-28 ENCOUNTER — Other Ambulatory Visit: Payer: Medicare Other

## 2019-04-28 ENCOUNTER — Ambulatory Visit: Payer: Medicare Other | Admitting: Cardiology

## 2019-04-28 ENCOUNTER — Encounter: Payer: Self-pay | Admitting: *Deleted

## 2019-04-28 LAB — BASIC METABOLIC PANEL
BUN/Creatinine Ratio: 16 (ref 10–24)
BUN: 15 mg/dL (ref 8–27)
CO2: 21 mmol/L (ref 20–29)
Calcium: 9.5 mg/dL (ref 8.6–10.2)
Chloride: 99 mmol/L (ref 96–106)
Creatinine, Ser: 0.95 mg/dL (ref 0.76–1.27)
GFR calc Af Amer: 99 mL/min/{1.73_m2} (ref >59)
GFR calc non Af Amer: 85 mL/min/{1.73_m2} (ref >59)
Glucose: 143 mg/dL — ABNORMAL HIGH (ref 65–99)
Potassium: 4.6 mmol/L (ref 3.5–5.2)
Sodium: 137 mmol/L (ref 134–144)

## 2019-04-28 LAB — HEPATIC FUNCTION PANEL
ALT: 17 IU/L (ref 0–44)
AST: 23 IU/L (ref 0–40)
Albumin: 4.5 g/dL (ref 3.8–4.8)
Alkaline Phosphatase: 94 IU/L (ref 39–117)
Bilirubin Total: 0.5 mg/dL (ref 0.0–1.2)
Bilirubin, Direct: 0.14 mg/dL (ref 0.00–0.40)
Total Protein: 7.6 g/dL (ref 6.0–8.5)

## 2019-04-28 LAB — TSH: TSH: 2.24 u[IU]/mL (ref 0.450–4.500)

## 2019-05-04 ENCOUNTER — Ambulatory Visit (HOSPITAL_BASED_OUTPATIENT_CLINIC_OR_DEPARTMENT_OTHER): Payer: Medicare Other

## 2019-05-07 ENCOUNTER — Other Ambulatory Visit (HOSPITAL_BASED_OUTPATIENT_CLINIC_OR_DEPARTMENT_OTHER): Payer: Medicare Other

## 2019-05-11 ENCOUNTER — Other Ambulatory Visit: Payer: Self-pay | Admitting: Family Medicine

## 2019-05-11 DIAGNOSIS — F4312 Post-traumatic stress disorder, chronic: Secondary | ICD-10-CM

## 2019-05-11 DIAGNOSIS — F209 Schizophrenia, unspecified: Secondary | ICD-10-CM

## 2019-05-11 NOTE — Telephone Encounter (Signed)
Requesting:  clonazepam Contract:   None UDS:  None Last Visit:   03/09/2019 Next Visit:   09/07/2019 Last Refill:    02/11/2019    #60 with 1 refill  Please Advise

## 2019-05-12 MED ORDER — TERBINAFINE HCL 250 MG PO TABS: 250.0000 mg | ORAL_TABLET | Freq: Every day | ORAL | 1 refills | Status: DC

## 2019-05-15 ENCOUNTER — Ambulatory Visit (HOSPITAL_BASED_OUTPATIENT_CLINIC_OR_DEPARTMENT_OTHER): Payer: Medicare Other | Attending: Cardiology

## 2019-06-08 ENCOUNTER — Other Ambulatory Visit: Payer: Self-pay | Admitting: Family Medicine

## 2019-06-09 ENCOUNTER — Other Ambulatory Visit: Payer: Self-pay | Admitting: Family Medicine

## 2019-06-09 ENCOUNTER — Other Ambulatory Visit: Payer: Self-pay | Admitting: Cardiology

## 2019-06-09 DIAGNOSIS — F4312 Post-traumatic stress disorder, chronic: Secondary | ICD-10-CM

## 2019-06-09 DIAGNOSIS — F209 Schizophrenia, unspecified: Secondary | ICD-10-CM

## 2019-06-10 ENCOUNTER — Telehealth: Payer: Self-pay | Admitting: Family Medicine

## 2019-06-10 NOTE — Telephone Encounter (Signed)
Per [facility], patient needs a prior authorization for medication    terbinafine (LAMISIL) 250 MG tablet [[id number]]   proior authorization can be done @ [phone]

## 2019-06-10 NOTE — Telephone Encounter (Signed)
What is the status of his psychiatry appt?

## 2019-06-10 NOTE — Telephone Encounter (Signed)
Insurance requiring PA however insurance won't cover more than 90 tablets in a 365 day period- 0 tablets remaining.

## 2019-06-10 NOTE — Telephone Encounter (Signed)
Requesting:   clonazepam Contract:    none UDS:    none Last Visit:     03/09/2019 Next Visit:    None scheduled Last Refill:     02/11/2019   #60 with 2 refills.  Please Advise

## 2019-06-10 NOTE — Telephone Encounter (Signed)
Plz let pt know he will have to transition to over the counter options or perhaps topical Vick's or [place]. Ty.

## 2019-06-10 NOTE — Telephone Encounter (Signed)
Rx refill sent to pharmacy. 

## 2019-06-10 NOTE — Telephone Encounter (Signed)
Called and spoke to the patient.Marland Kitchen His Case worker is still trying to find him a male psy.  Evidently they were putting him with a male and he request a male.  He states he is still working on this. Next appt with PCP is on 09/07/2019

## 2019-06-12 NOTE — Telephone Encounter (Signed)
[name] [name] with [facility] called - she has been advised the PA is denied and we will contact the pt

## 2019-06-15 NOTE — Telephone Encounter (Signed)
Called informed the patient. He stated he was using only for his hands and he has not problems with this any more and no need for the medication.

## 2019-07-07 ENCOUNTER — Other Ambulatory Visit: Payer: Self-pay | Admitting: Family Medicine

## 2019-07-08 ENCOUNTER — Other Ambulatory Visit: Payer: Self-pay | Admitting: Family Medicine

## 2019-07-08 DIAGNOSIS — F4312 Post-traumatic stress disorder, chronic: Secondary | ICD-10-CM

## 2019-07-08 DIAGNOSIS — F209 Schizophrenia, unspecified: Secondary | ICD-10-CM

## 2019-08-06 ENCOUNTER — Other Ambulatory Visit: Payer: Self-pay | Admitting: Family Medicine

## 2019-08-06 ENCOUNTER — Other Ambulatory Visit: Payer: Self-pay | Admitting: Cardiology

## 2019-08-06 DIAGNOSIS — F209 Schizophrenia, unspecified: Secondary | ICD-10-CM

## 2019-08-06 DIAGNOSIS — F4312 Post-traumatic stress disorder, chronic: Secondary | ICD-10-CM

## 2019-08-06 NOTE — Telephone Encounter (Signed)
Ok to refill 

## 2019-08-10 NOTE — Telephone Encounter (Signed)
Requesting:  clonazepam Contract:   none UDS:   none Last Visit:   #60 with 1 refill on 06/10/2019 Next Visit:  03/09/2019 Last Refill:    09/07/2019  Please Advise

## 2019-08-24 ENCOUNTER — Ambulatory Visit: Payer: Medicare Other | Admitting: Cardiology

## 2019-09-02 ENCOUNTER — Other Ambulatory Visit: Payer: Self-pay | Admitting: Family Medicine

## 2019-09-02 DIAGNOSIS — F209 Schizophrenia, unspecified: Secondary | ICD-10-CM

## 2019-09-02 DIAGNOSIS — F4312 Post-traumatic stress disorder, chronic: Secondary | ICD-10-CM

## 2019-09-03 ENCOUNTER — Telehealth: Payer: Self-pay

## 2019-09-03 NOTE — Telephone Encounter (Signed)
Requesting:clonzepam  Contract: n/a UDS:n/a Last Visit:03/09/19 Next Visit:09/07/19 Last Refill:08/11/19  Please Advise

## 2019-09-03 NOTE — Telephone Encounter (Signed)
Too soon for a refill. Ty.

## 2019-09-04 NOTE — Telephone Encounter (Signed)
Pharmacy informed.

## 2019-09-07 ENCOUNTER — Other Ambulatory Visit: Payer: Self-pay

## 2019-09-07 ENCOUNTER — Encounter: Payer: Self-pay | Admitting: Family Medicine

## 2019-09-07 ENCOUNTER — Other Ambulatory Visit: Payer: Self-pay | Admitting: Family Medicine

## 2019-09-07 ENCOUNTER — Ambulatory Visit (INDEPENDENT_AMBULATORY_CARE_PROVIDER_SITE_OTHER): Payer: Medicare Other | Admitting: Family Medicine

## 2019-09-07 VITALS — BP 122/78 | HR 84 | Temp 96.2°F | Wt 190.0 lb

## 2019-09-07 DIAGNOSIS — F319 Bipolar disorder, unspecified: Secondary | ICD-10-CM

## 2019-09-07 DIAGNOSIS — E782 Mixed hyperlipidemia: Secondary | ICD-10-CM

## 2019-09-07 DIAGNOSIS — B182 Chronic viral hepatitis C: Secondary | ICD-10-CM

## 2019-09-07 DIAGNOSIS — E1165 Type 2 diabetes mellitus with hyperglycemia: Secondary | ICD-10-CM | POA: Diagnosis not present

## 2019-09-07 DIAGNOSIS — Z1211 Encounter for screening for malignant neoplasm of colon: Secondary | ICD-10-CM

## 2019-09-07 DIAGNOSIS — E785 Hyperlipidemia, unspecified: Secondary | ICD-10-CM

## 2019-09-07 HISTORY — DX: Chronic viral hepatitis C: B18.2

## 2019-09-07 LAB — LIPID PANEL
Cholesterol: 229 mg/dL — ABNORMAL HIGH (ref 0–200)
HDL: 37.9 mg/dL — ABNORMAL LOW (ref >39.00)
LDL Cholesterol: 165 mg/dL — ABNORMAL HIGH (ref 0–99)
NonHDL: 190.93
Total CHOL/HDL Ratio: 6
Triglycerides: 132 mg/dL (ref 0.0–149.0)
VLDL: 26.4 mg/dL (ref 0.0–40.0)

## 2019-09-07 LAB — COMPREHENSIVE METABOLIC PANEL
ALT: 47 U/L (ref 0–53)
AST: 42 U/L — ABNORMAL HIGH (ref 0–37)
Albumin: 4.2 g/dL (ref 3.5–5.2)
Alkaline Phosphatase: 92 U/L (ref 39–117)
BUN: 9 mg/dL (ref 6–23)
CO2: 21 mEq/L (ref 19–32)
Calcium: 9.2 mg/dL (ref 8.4–10.5)
Chloride: 103 mEq/L (ref 96–112)
Creatinine, Ser: 0.68 mg/dL (ref 0.40–1.50)
GFR: 117.75 mL/min (ref >60.00)
Glucose, Bld: 173 mg/dL — ABNORMAL HIGH (ref 70–99)
Potassium: 4 mEq/L (ref 3.5–5.1)
Sodium: 136 mEq/L (ref 135–145)
Total Bilirubin: 0.5 mg/dL (ref 0.2–1.2)
Total Protein: 7.3 g/dL (ref 6.0–8.3)

## 2019-09-07 LAB — HEMOGLOBIN A1C: Hgb A1c MFr Bld: 7.4 % — ABNORMAL HIGH (ref 4.6–6.5)

## 2019-09-07 MED ORDER — ATORVASTATIN CALCIUM 40 MG PO TABS: 40.0000 mg | ORAL_TABLET | Freq: Every day | ORAL | 3 refills | Status: DC
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
## 2019-09-07 NOTE — Patient Instructions (Addendum)
Give [place] 2-3 business days to get the results of your labs back.   Keep the diet clean and stay active.  Let us know if you need anything.  If you do not hear anything about your referral in the next 1-2 weeks, call our office and ask for an update.  Let us know if you need anything.

## 2019-09-09 ENCOUNTER — Other Ambulatory Visit: Payer: Self-pay | Admitting: Family Medicine

## 2019-09-09 DIAGNOSIS — F209 Schizophrenia, unspecified: Secondary | ICD-10-CM

## 2019-09-09 DIAGNOSIS — F4312 Post-traumatic stress disorder, chronic: Secondary | ICD-10-CM

## 2019-09-09 NOTE — Telephone Encounter (Signed)
Last Refill -- 08/11/2019  #60 no refills Last OV--09/07/2019 No upcoming No UDS/No CSC

## 2019-09-16 ENCOUNTER — Ambulatory Visit: Payer: Medicare Other | Admitting: Cardiology

## 2019-09-23 ENCOUNTER — Other Ambulatory Visit: Payer: Self-pay

## 2019-09-23 ENCOUNTER — Ambulatory Visit (INDEPENDENT_AMBULATORY_CARE_PROVIDER_SITE_OTHER): Payer: Medicare Other | Admitting: Cardiology

## 2019-09-23 ENCOUNTER — Encounter: Payer: Self-pay | Admitting: Cardiology

## 2019-09-23 VITALS — BP 100/70 | HR 76

## 2019-09-23 DIAGNOSIS — F319 Bipolar disorder, unspecified: Secondary | ICD-10-CM | POA: Diagnosis not present

## 2019-09-23 DIAGNOSIS — Z79899 Other long term (current) drug therapy: Secondary | ICD-10-CM | POA: Diagnosis not present

## 2019-09-23 DIAGNOSIS — Z7901 Long term (current) use of anticoagulants: Secondary | ICD-10-CM | POA: Diagnosis not present

## 2019-09-23 DIAGNOSIS — I420 Dilated cardiomyopathy: Secondary | ICD-10-CM

## 2019-09-23 DIAGNOSIS — I480 Paroxysmal atrial fibrillation: Secondary | ICD-10-CM | POA: Diagnosis not present
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
## 2019-09-23 NOTE — Patient Instructions (Signed)
Medication Instructions:  Your physician recommends that you continue on your current medications as directed. Please refer to the Current Medication list given to you today.  *If you need a refill on your cardiac medications before your next appointment, please call your pharmacy*   Lab Work: None. If you have labs (blood work) drawn today and your tests are completely normal, you will receive your results only by: . MyChart Message (if you have MyChart) OR . A paper copy in the mail If you have any lab test that is abnormal or we need to change your treatment, we will call you to review the results.   Testing/Procedures: Your physician has requested that you have an echocardiogram. Echocardiography is a painless test that uses sound waves to create images of your heart. It provides your doctor with information about the size and shape of your heart and how well your heart's chambers and valves are working. This procedure takes approximately one hour. There are no restrictions for this procedure.     Follow-Up: At [facility], you and your health needs are our priority.  As part of our continuing mission to provide you with exceptional heart care, we have created designated Provider Care Teams.  These Care Teams include your primary Cardiologist (physician) and Advanced Practice Providers (APPs -  Physician Assistants and Nurse Practitioners) who all work together to provide you with the care you need, when you need it.  We recommend signing up for the patient portal called "MyChart".  Sign up information is provided on this After Visit Summary.  MyChart is used to connect with patients for Virtual Visits (Telemedicine).  Patients are able to view lab/test results, encounter notes, upcoming appointments, etc.  Non-urgent messages can be sent to your provider as well.   To learn more about what you can do with MyChart, go to [email or web address].    Your next appointment:   3  month(s)  The format for your next appointment:   In Person  Provider:   Robert Krasowski, MD   Other Instructions   Echocardiogram An echocardiogram is a procedure that uses painless sound waves (ultrasound) to produce an image of the heart. Images from an echocardiogram can provide important information about:  Signs of coronary artery disease (CAD).  Aneurysm detection. An aneurysm is a weak or damaged part of an artery wall that bulges out from the normal force of blood pumping through the body.  Heart size and shape. Changes in the size or shape of the heart can be associated with certain conditions, including heart failure, aneurysm, and CAD.  Heart muscle function.  Heart valve function.  Signs of a past heart attack.  Fluid buildup around the heart.  Thickening of the heart muscle.  A tumor or infectious growth around the heart valves. Tell a health care provider about:  Any allergies you have.  All medicines you are taking, including vitamins, herbs, eye drops, creams, and over-the-counter medicines.  Any blood disorders you have.  Any surgeries you have had.  Any medical conditions you have.  Whether you are pregnant or may be pregnant. What are the risks? Generally, this is a safe procedure. However, problems may occur, including:  Allergic reaction to dye (contrast) that may be used during the procedure. What happens before the procedure? No specific preparation is needed. You may eat and drink normally. What happens during the procedure?   An IV tube may be inserted into one of your veins.  You may receive   contrast through this tube. A contrast is an injection that improves the quality of the pictures from your heart.  A gel will be applied to your chest.  A wand-like tool (transducer) will be moved over your chest. The gel will help to transmit the sound waves from the transducer.  The sound waves will harmlessly bounce off of your heart to  allow the heart images to be captured in real-time motion. The images will be recorded on a computer. The procedure may vary among health care providers and hospitals. What happens after the procedure?  You may return to your normal, everyday life, including diet, activities, and medicines, unless your health care provider tells you not to do that. Summary  An echocardiogram is a procedure that uses painless sound waves (ultrasound) to produce an image of the heart.  Images from an echocardiogram can provide important information about the size and shape of your heart, heart muscle function, heart valve function, and fluid buildup around your heart.  You do not need to do anything to prepare before this procedure. You may eat and drink normally.  After the echocardiogram is completed, you may return to your normal, everyday life, unless your health care provider tells you not to do that. This information is not intended to replace advice given to you by your health care provider. Make sure you discuss any questions you have with your health care provider. Document Revised: 08/14/2018 Document Reviewed: 05/26/2016 Elsevier Patient Education  2020 [employer]   

## 2019-09-28 ENCOUNTER — Telehealth: Payer: Self-pay | Admitting: Family Medicine

## 2019-09-28 NOTE — Telephone Encounter (Signed)
Patient called requested phone number from behavioral health person we referred patient to

## 2019-09-29 ENCOUNTER — Other Ambulatory Visit: Payer: Self-pay | Admitting: Family Medicine

## 2019-09-30 ENCOUNTER — Other Ambulatory Visit (HOSPITAL_BASED_OUTPATIENT_CLINIC_OR_DEPARTMENT_OTHER): Payer: Medicare Other

## 2019-10-07 ENCOUNTER — Other Ambulatory Visit: Payer: Self-pay

## 2019-10-07 ENCOUNTER — Ambulatory Visit (HOSPITAL_BASED_OUTPATIENT_CLINIC_OR_DEPARTMENT_OTHER)
Admission: RE | Admit: 2019-10-07 | Discharge: 2019-10-07 | Disposition: A | Discharge status: Home / Self Care | Payer: Medicare Other | Source: Ambulatory Visit | Attending: Cardiology | Admitting: Cardiology

## 2019-10-07 DIAGNOSIS — I420 Dilated cardiomyopathy: Secondary | ICD-10-CM | POA: Diagnosis not present

## 2019-10-13 ENCOUNTER — Telehealth (INDEPENDENT_AMBULATORY_CARE_PROVIDER_SITE_OTHER): Payer: Medicare Other | Admitting: Psychiatry

## 2019-10-13 ENCOUNTER — Encounter (HOSPITAL_COMMUNITY): Payer: Self-pay | Admitting: Psychiatry

## 2019-10-13 DIAGNOSIS — F319 Bipolar disorder, unspecified: Secondary | ICD-10-CM | POA: Diagnosis not present

## 2019-10-13 DIAGNOSIS — Z79899 Other long term (current) drug therapy: Secondary | ICD-10-CM

## 2019-10-13 DIAGNOSIS — F4323 Adjustment disorder with mixed anxiety and depressed mood: Secondary | ICD-10-CM | POA: Diagnosis not present

## 2019-10-13 MED ORDER — LAMOTRIGINE 25 MG PO TABS
25.0000 mg | ORAL_TABLET | Freq: Every day | ORAL | 0 refills | Status: DC
Start: 2019-10-13 — End: 2020-01-08
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
## 2019-10-26 ENCOUNTER — Encounter: Payer: Self-pay | Admitting: Family Medicine

## 2019-10-28 ENCOUNTER — Telehealth: Payer: Self-pay | Admitting: Family Medicine

## 2019-10-28 NOTE — Telephone Encounter (Signed)
PCP completed FL2 Called number on form [phone] [facility] Omelia [place]) Left message to call back.

## 2019-10-28 NOTE — Telephone Encounter (Signed)
Sophia called back and will pickup at the front desk.

## 2019-11-12 ENCOUNTER — Telehealth (HOSPITAL_COMMUNITY): Payer: Medicare Other | Admitting: Psychiatry

## 2019-11-16 ENCOUNTER — Encounter: Payer: Self-pay | Admitting: Family Medicine

## 2019-11-17 ENCOUNTER — Encounter: Payer: Self-pay | Admitting: Family Medicine

## 2019-12-08 ENCOUNTER — Other Ambulatory Visit: Payer: Self-pay | Admitting: Family Medicine

## 2019-12-30 ENCOUNTER — Ambulatory Visit: Payer: Medicare Other | Admitting: Cardiology

## 2020-01-04 ENCOUNTER — Other Ambulatory Visit: Payer: Self-pay | Admitting: Family Medicine

## 2020-01-08 ENCOUNTER — Other Ambulatory Visit: Payer: Self-pay

## 2020-01-08 ENCOUNTER — Ambulatory Visit (INDEPENDENT_AMBULATORY_CARE_PROVIDER_SITE_OTHER): Payer: Medicare Other | Admitting: Cardiology

## 2020-01-08 ENCOUNTER — Encounter: Payer: Self-pay | Admitting: Cardiology

## 2020-01-08 VITALS — BP 104/74 | HR 149 | Ht <= 58 in | Wt 190.0 lb

## 2020-01-08 DIAGNOSIS — F203 Undifferentiated schizophrenia: Secondary | ICD-10-CM

## 2020-01-08 DIAGNOSIS — I5022 Chronic systolic (congestive) heart failure: Secondary | ICD-10-CM

## 2020-01-08 DIAGNOSIS — I420 Dilated cardiomyopathy: Secondary | ICD-10-CM

## 2020-01-08 DIAGNOSIS — Z720 Tobacco use: Secondary | ICD-10-CM | POA: Diagnosis not present

## 2020-01-08 DIAGNOSIS — Z7901 Long term (current) use of anticoagulants: Secondary | ICD-10-CM

## 2020-01-08 DIAGNOSIS — I480 Paroxysmal atrial fibrillation: Secondary | ICD-10-CM | POA: Diagnosis not present

## 2020-01-08 MED ORDER — AMIODARONE HCL 200 MG PO TABS
200.0000 mg | ORAL_TABLET | Freq: Two times a day (BID) | ORAL | 1 refills | Status: DC
Start: 2020-01-08 — End: 2020-02-08

## 2020-01-08 NOTE — Addendum Note (Signed)
Addended by: Lita Mains on: 01/08/2020 09:14 AM   Modules accepted: Orders

## 2020-01-08 NOTE — Patient Instructions (Signed)
Medication Instructions:  Your physician has recommended you make the following change in your medication:  INCREASE: Amiodarone to 200 mg twice daily  *If you need a refill on your cardiac medications before your next appointment, please call your pharmacy*   Lab Work: None  If you have labs (blood work) drawn today and your tests are completely normal, you will receive your results only by: Marland Kitchen MyChart Message (if you have MyChart) OR . A paper copy in the mail If you have any lab test that is abnormal or we need to change your treatment, we will call you to review the results.   Testing/Procedures: None   Follow-Up: At [facility], you and your health needs are our priority.  As part of our continuing mission to provide you with exceptional heart care, we have created designated Provider Care Teams.  These Care Teams include your primary Cardiologist (physician) and Advanced Practice Providers (APPs -  Physician Assistants and Nurse Practitioners) who all work together to provide you with the care you need, when you need it.  We recommend signing up for the patient portal called "MyChart".  Sign up information is provided on this After Visit Summary.  MyChart is used to connect with patients for Virtual Visits (Telemedicine).  Patients are able to view lab/test results, encounter notes, upcoming appointments, etc.  Non-urgent messages can be sent to your provider as well.   To learn more about what you can do with MyChart, go to [email or web address].    Your next appointment:   3 week(s)  The format for your next appointment:   In Person  Provider:   Gypsy Balsam, MD   Other Instructions
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
## 2020-01-17 DIAGNOSIS — Z23 Encounter for immunization: Secondary | ICD-10-CM | POA: Diagnosis not present

## 2020-01-29 ENCOUNTER — Ambulatory Visit: Payer: Medicare Other | Admitting: Cardiology

## 2020-02-05 ENCOUNTER — Other Ambulatory Visit: Payer: Self-pay | Admitting: Family Medicine

## 2020-02-05 ENCOUNTER — Other Ambulatory Visit: Payer: Self-pay | Admitting: Cardiology

## 2020-02-05 DIAGNOSIS — F4312 Post-traumatic stress disorder, chronic: Secondary | ICD-10-CM

## 2020-02-05 DIAGNOSIS — F209 Schizophrenia, unspecified: Secondary | ICD-10-CM

## 2020-02-05 NOTE — Telephone Encounter (Signed)
Called informed the patient and he stated he as one refill left and he plans on coming off these medications.

## 2020-02-05 NOTE — Telephone Encounter (Signed)
I would like him to reach out to Dr. Gilmore Laroche for these refills. Ty.

## 2020-02-05 NOTE — Telephone Encounter (Signed)
Last RF--#60 with 5 refills on 09/09/19 Last OV---09/07/19 No UDS/CSC

## 2020-02-11 DIAGNOSIS — F4310 Post-traumatic stress disorder, unspecified: Secondary | ICD-10-CM | POA: Insufficient documentation

## 2020-02-11 DIAGNOSIS — K759 Inflammatory liver disease, unspecified: Secondary | ICD-10-CM | POA: Insufficient documentation

## 2020-02-11 DIAGNOSIS — G8929 Other chronic pain: Secondary | ICD-10-CM | POA: Insufficient documentation

## 2020-02-11 DIAGNOSIS — S060XAA Concussion with loss of consciousness status unknown, initial encounter: Secondary | ICD-10-CM | POA: Insufficient documentation

## 2020-02-11 DIAGNOSIS — T879 Unspecified complications of amputation stump: Secondary | ICD-10-CM | POA: Insufficient documentation

## 2020-02-11 DIAGNOSIS — G4700 Insomnia, unspecified: Secondary | ICD-10-CM | POA: Insufficient documentation

## 2020-02-11 DIAGNOSIS — I219 Acute myocardial infarction, unspecified: Secondary | ICD-10-CM | POA: Insufficient documentation

## 2020-02-11 DIAGNOSIS — F429 Obsessive-compulsive disorder, unspecified: Secondary | ICD-10-CM | POA: Insufficient documentation

## 2020-02-11 DIAGNOSIS — Z87442 Personal history of urinary calculi: Secondary | ICD-10-CM | POA: Insufficient documentation

## 2020-02-11 DIAGNOSIS — F32A Depression, unspecified: Secondary | ICD-10-CM | POA: Insufficient documentation

## 2020-02-11 DIAGNOSIS — I4819 Other persistent atrial fibrillation: Secondary | ICD-10-CM | POA: Insufficient documentation

## 2020-02-11 DIAGNOSIS — F410 Panic disorder [episodic paroxysmal anxiety] without agoraphobia: Secondary | ICD-10-CM | POA: Insufficient documentation

## 2020-02-11 DIAGNOSIS — S060X9A Concussion with loss of consciousness of unspecified duration, initial encounter: Secondary | ICD-10-CM | POA: Insufficient documentation

## 2020-02-11 DIAGNOSIS — K029 Dental caries, unspecified: Secondary | ICD-10-CM | POA: Insufficient documentation

## 2020-02-11 DIAGNOSIS — W3400XA Accidental discharge from unspecified firearms or gun, initial encounter: Secondary | ICD-10-CM | POA: Insufficient documentation

## 2020-02-11 DIAGNOSIS — F319 Bipolar disorder, unspecified: Secondary | ICD-10-CM | POA: Insufficient documentation

## 2020-02-11 DIAGNOSIS — M1990 Unspecified osteoarthritis, unspecified site: Secondary | ICD-10-CM | POA: Insufficient documentation

## 2020-02-11 DIAGNOSIS — F1110 Opioid abuse, uncomplicated: Secondary | ICD-10-CM | POA: Insufficient documentation

## 2020-02-11 DIAGNOSIS — I10 Essential (primary) hypertension: Secondary | ICD-10-CM | POA: Insufficient documentation

## 2020-02-12 ENCOUNTER — Ambulatory Visit (INDEPENDENT_AMBULATORY_CARE_PROVIDER_SITE_OTHER): Payer: Medicare Other | Admitting: Cardiology

## 2020-02-12 ENCOUNTER — Encounter: Payer: Self-pay | Admitting: Cardiology

## 2020-02-12 ENCOUNTER — Other Ambulatory Visit: Payer: Self-pay

## 2020-02-12 VITALS — BP 92/64 | HR 133 | Ht <= 58 in | Wt 190.0 lb

## 2020-02-12 DIAGNOSIS — I4819 Other persistent atrial fibrillation: Secondary | ICD-10-CM | POA: Diagnosis not present

## 2020-02-12 DIAGNOSIS — I420 Dilated cardiomyopathy: Secondary | ICD-10-CM | POA: Diagnosis not present

## 2020-02-12 DIAGNOSIS — I10 Essential (primary) hypertension: Secondary | ICD-10-CM | POA: Diagnosis not present

## 2020-02-12 MED ORDER — AMIODARONE HCL 200 MG PO TABS: ORAL_TABLET | ORAL | 0 refills | Status: DC

## 2020-02-12 NOTE — Addendum Note (Signed)
Addended by: Hazle Quant on: 02/12/2020 08:46 AM   Modules accepted: Orders
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
## 2020-02-12 NOTE — Patient Instructions (Signed)
Medication Instructions:  Your physician has recommended you make the following change in your medication:  INCREASE: Amiodarone to 200 three times daily for two weeks then go back to 1 tablet twice daily.   *If you need a refill on your cardiac medications before your next appointment, please call your pharmacy*   Lab Work: None If you have labs (blood work) drawn today and your tests are completely normal, you will receive your results only by: Marland Kitchen MyChart Message (if you have MyChart) OR . A paper copy in the mail If you have any lab test that is abnormal or we need to change your treatment, we will call you to review the results.   Testing/Procedures: None   Follow-Up: At [facility], you and your health needs are our priority.  As part of our continuing mission to provide you with exceptional heart care, we have created designated Provider Care Teams.  These Care Teams include your primary Cardiologist (physician) and Advanced Practice Providers (APPs -  Physician Assistants and Nurse Practitioners) who all work together to provide you with the care you need, when you need it.  We recommend signing up for the patient portal called "MyChart".  Sign up information is provided on this After Visit Summary.  MyChart is used to connect with patients for Virtual Visits (Telemedicine).  Patients are able to view lab/test results, encounter notes, upcoming appointments, etc.  Non-urgent messages can be sent to your provider as well.   To learn more about what you can do with MyChart, go to [email or web address].    Your next appointment:   3 week(s)  The format for your next appointment:   In Person  Provider:   Gypsy Balsam, MD   Other Instructions

## 2020-02-14 DIAGNOSIS — Z23 Encounter for immunization: Secondary | ICD-10-CM | POA: Diagnosis not present

## 2020-03-04 ENCOUNTER — Other Ambulatory Visit: Payer: Self-pay | Admitting: Family Medicine

## 2020-03-04 DIAGNOSIS — F4312 Post-traumatic stress disorder, chronic: Secondary | ICD-10-CM

## 2020-03-04 DIAGNOSIS — F209 Schizophrenia, unspecified: Secondary | ICD-10-CM

## 2020-03-04 NOTE — Telephone Encounter (Signed)
See telephone note from 02/05/2020 "refill"

## 2020-03-05 IMAGING — MG DIGITAL DIAGNOSTIC BILAT W/ TOMO W/ CAD
8 series · 8 of 24 positions shown · non-contrast
Comparison: None.

ACR Breast Density Category a: The breast tissue is almost entirely
fatty.

CLINICAL DATA: Diffuse right breast pain and swelling for the past
8 months.The patient has cardiomyopathy with an ejection fraction of
25%.

EXAM:
DIGITAL DIAGNOSTIC BILATERAL MAMMOGRAM WITH CAD AND TOMO

[L CC synth-2D]
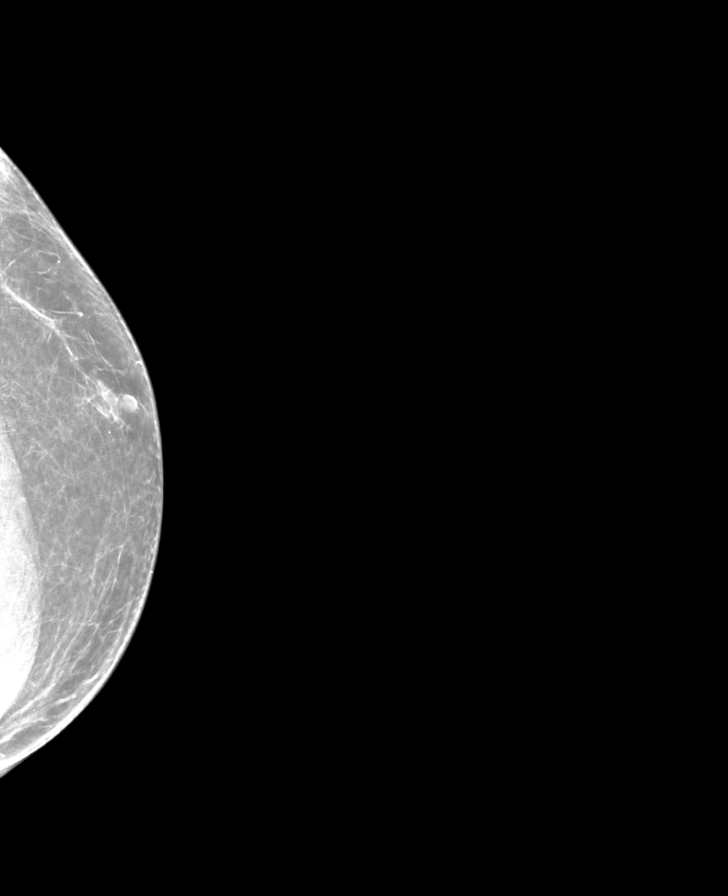

[R CC synth-2D]
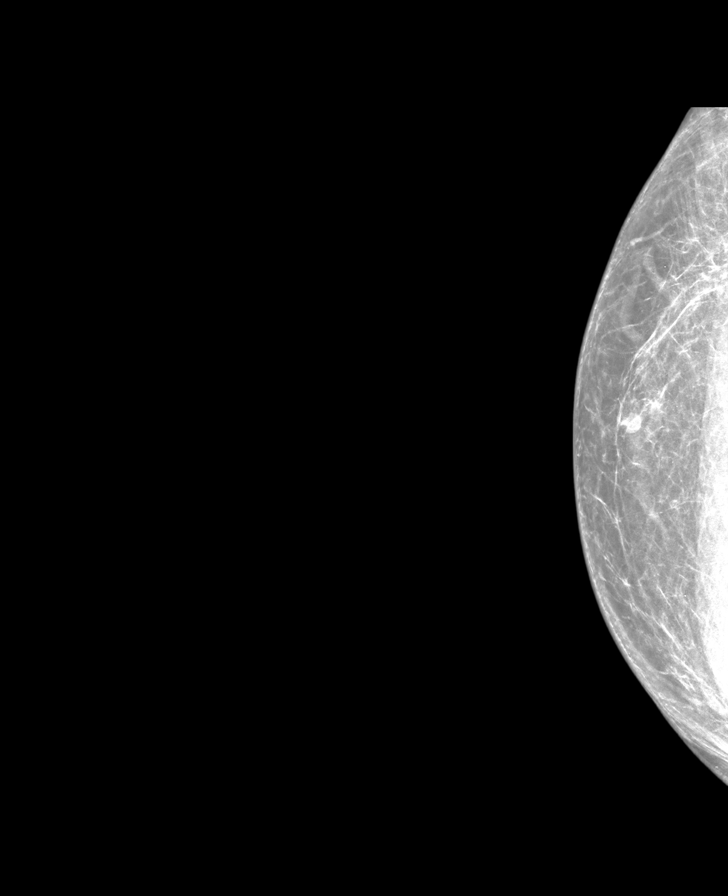

[R MLO synth-2D]
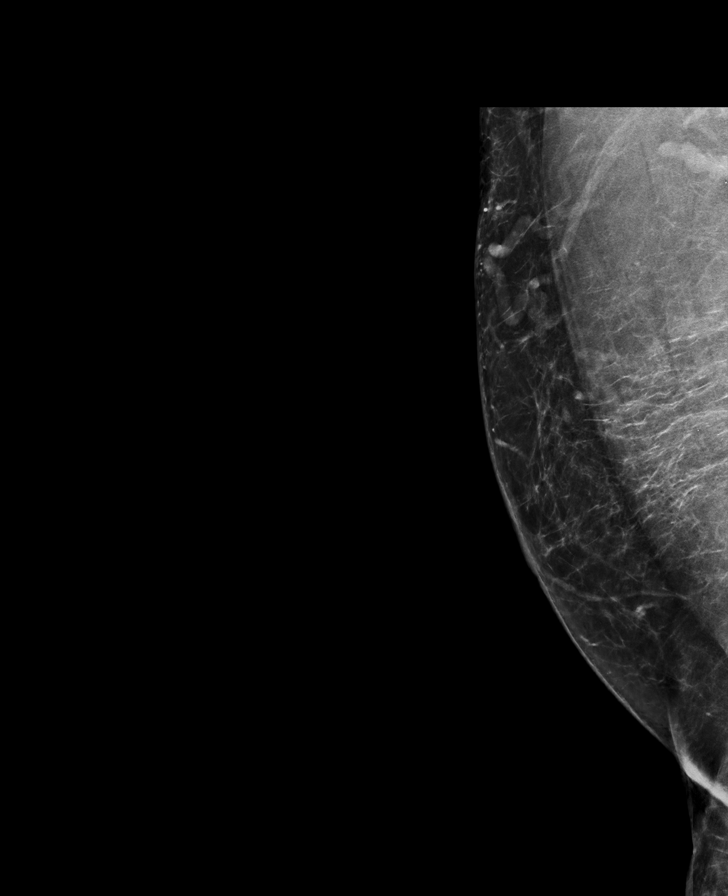

[L MLO synth-2D]
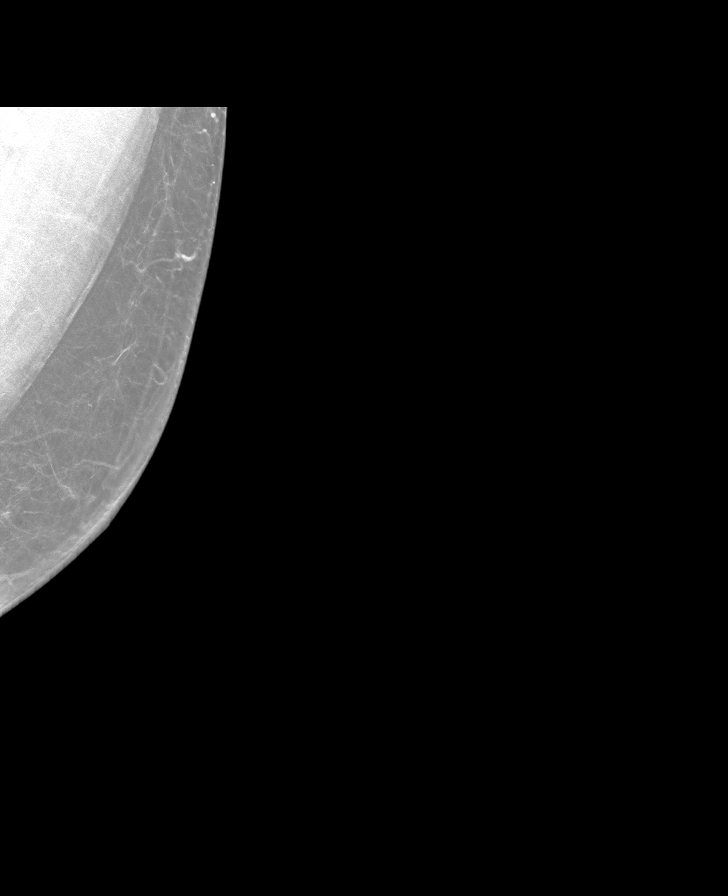

[L MLO tomo · tomo slice 35/70.0]
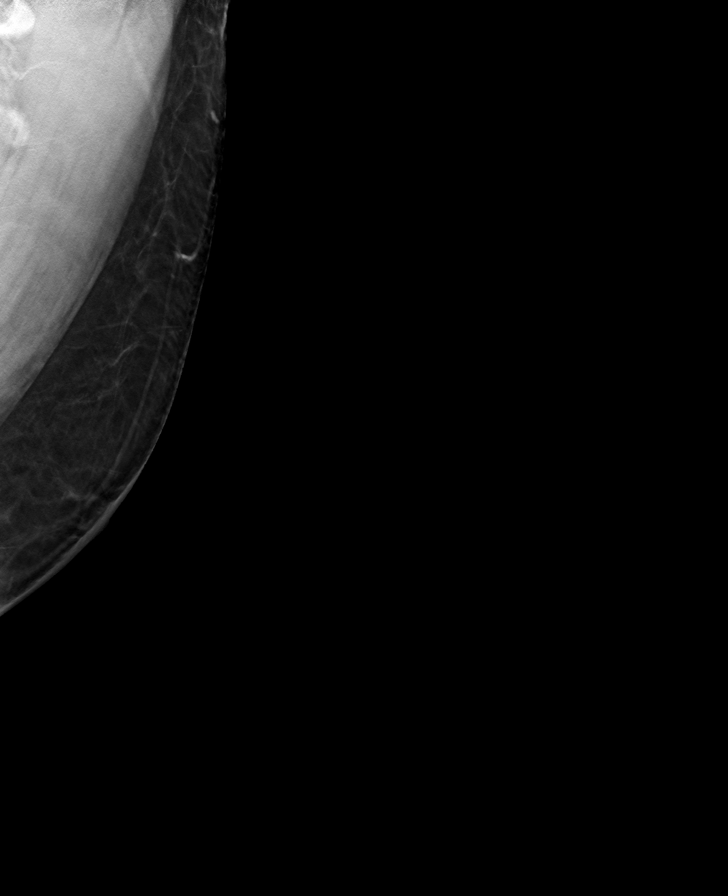

[R CC tomo · tomo slice 31/62.0]
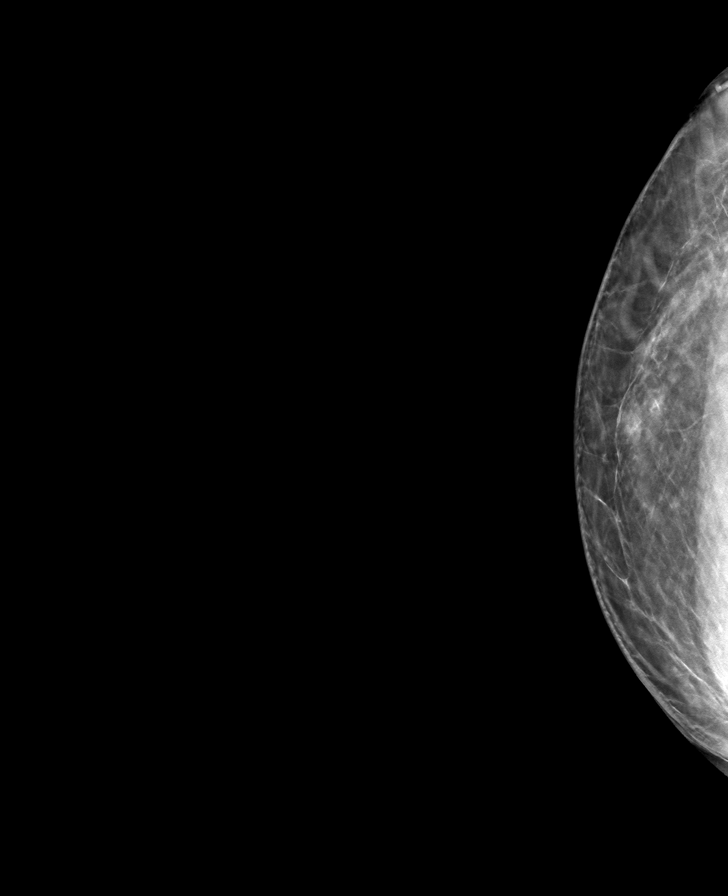

[L CC tomo · tomo slice 29/58.0]
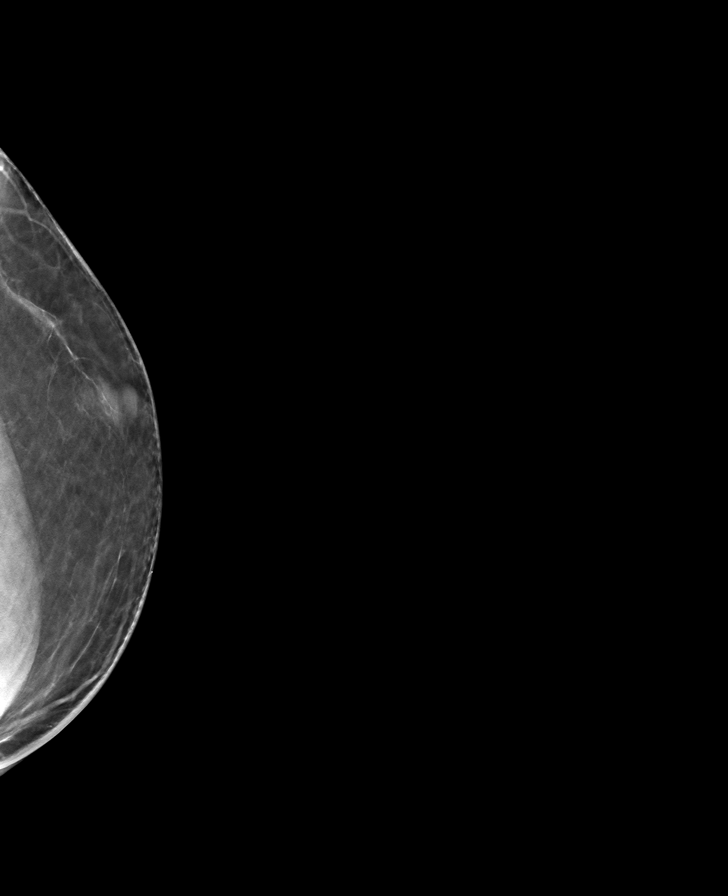

[R MLO tomo · tomo slice 38/75.0]
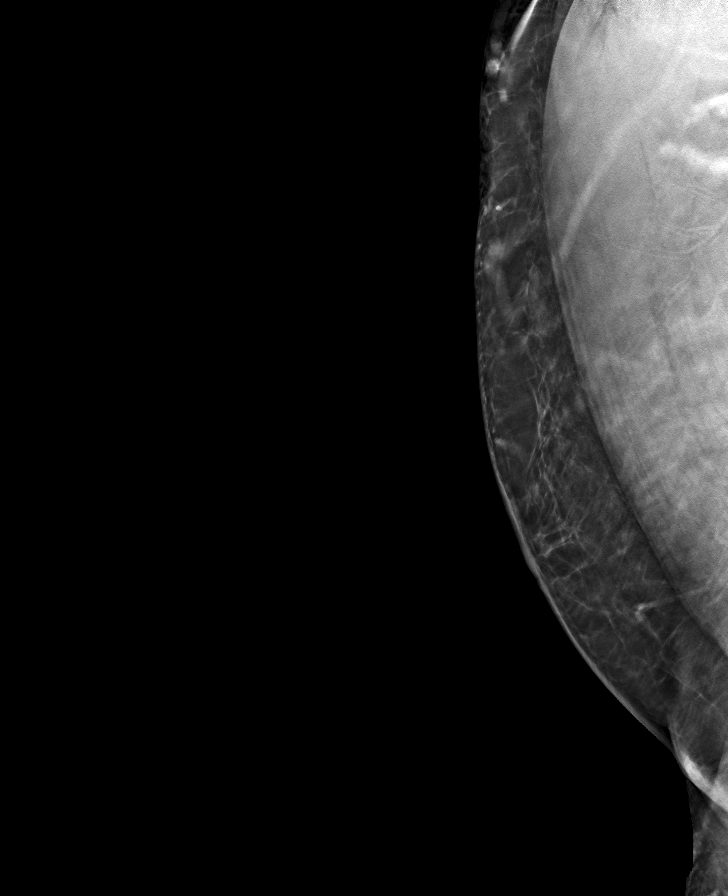

[8 of 24 positions shown; findings below may reference images not displayed]

FINDINGS: Multiple enlarged vessels are demonstrated in the upper outer right
breast as well as mild diffuse interstitial edema in the right
breast. No glandular tissue is seen on either side. There are no
findings suspicious for malignancy in either breast.

Mammographic images were processed with CAD.
IMPRESSION: Right breast edema and vascular engorgement compatible with right
heart failure. No evidence of malignancy or gynecomastia.

RECOMMENDATION:
Clinical follow-up.

I have discussed the findings and recommendations with the patient.
If applicable, a reminder letter will be sent to the patient
regarding the next appointment.

BI-RADS CATEGORY  2: Benign.

## 2020-03-22 DIAGNOSIS — I4891 Unspecified atrial fibrillation: Secondary | ICD-10-CM | POA: Insufficient documentation

## 2020-03-23 ENCOUNTER — Ambulatory Visit: Payer: Medicare Other | Admitting: Cardiology

## 2020-03-30 ENCOUNTER — Other Ambulatory Visit: Payer: Self-pay | Admitting: Family Medicine

## 2020-03-30 ENCOUNTER — Other Ambulatory Visit: Payer: Self-pay | Admitting: Cardiology

## 2020-05-03 ENCOUNTER — Ambulatory Visit (INDEPENDENT_AMBULATORY_CARE_PROVIDER_SITE_OTHER): Payer: Medicare Other

## 2020-05-03 ENCOUNTER — Other Ambulatory Visit: Payer: Self-pay

## 2020-05-03 ENCOUNTER — Encounter: Payer: Self-pay | Admitting: Cardiology

## 2020-05-03 ENCOUNTER — Ambulatory Visit (INDEPENDENT_AMBULATORY_CARE_PROVIDER_SITE_OTHER): Payer: Medicare Other | Admitting: Cardiology

## 2020-05-03 VITALS — BP 160/90 | HR 80 | Ht <= 58 in | Wt 220.0 lb

## 2020-05-03 DIAGNOSIS — I4819 Other persistent atrial fibrillation: Secondary | ICD-10-CM

## 2020-05-03 DIAGNOSIS — Z89612 Acquired absence of left leg above knee: Secondary | ICD-10-CM | POA: Diagnosis not present

## 2020-05-03 DIAGNOSIS — Z720 Tobacco use: Secondary | ICD-10-CM | POA: Diagnosis not present

## 2020-05-03 DIAGNOSIS — I10 Essential (primary) hypertension: Secondary | ICD-10-CM

## 2020-05-03 DIAGNOSIS — Z89611 Acquired absence of right leg above knee: Secondary | ICD-10-CM | POA: Diagnosis not present

## 2020-05-03 DIAGNOSIS — I420 Dilated cardiomyopathy: Secondary | ICD-10-CM

## 2020-05-03 NOTE — Patient Instructions (Signed)
Medication Instructions:  Your physician recommends that you continue on your current medications as directed. Please refer to the Current Medication list given to you today.  *If you need a refill on your cardiac medications before your next appointment, please call your pharmacy*   Lab Work: None If you have labs (blood work) drawn today and your tests are completely normal, you will receive your results only by: . MyChart Message (if you have MyChart) OR . A paper copy in the mail If you have any lab test that is abnormal or we need to change your treatment, we will call you to review the results.   Testing/Procedures: A zio monitor was ordered today. It will remain on for 7 days. You will then return monitor and event diary in provided box. It takes 1-2 weeks for report to be downloaded and returned to us. We will call you with the results. If monitor falls off or has orange flashing light, please call Zio for further instructions.      Follow-Up: At [facility], you and your health needs are our priority.  As part of our continuing mission to provide you with exceptional heart care, we have created designated Provider Care Teams.  These Care Teams include your primary Cardiologist (physician) and Advanced Practice Providers (APPs -  Physician Assistants and Nurse Practitioners) who all work together to provide you with the care you need, when you need it.  We recommend signing up for the patient portal called "MyChart".  Sign up information is provided on this After Visit Summary.  MyChart is used to connect with patients for Virtual Visits (Telemedicine).  Patients are able to view lab/test results, encounter notes, upcoming appointments, etc.  Non-urgent messages can be sent to your provider as well.   To learn more about what you can do with MyChart, go to [email or web address].    Your next appointment:   3 month(s)  The format for your next appointment:   In  Person  Provider:   Robert Krasowski, MD   Other Instructions    
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
## 2020-05-09 DIAGNOSIS — I4819 Other persistent atrial fibrillation: Secondary | ICD-10-CM

## 2020-05-25 ENCOUNTER — Other Ambulatory Visit: Payer: Self-pay | Admitting: Family Medicine

## 2020-05-27 ENCOUNTER — Other Ambulatory Visit: Payer: Self-pay | Admitting: Family Medicine

## 2020-05-27 ENCOUNTER — Other Ambulatory Visit: Payer: Self-pay | Admitting: Cardiology

## 2020-05-30 ENCOUNTER — Other Ambulatory Visit: Payer: Self-pay | Admitting: Family Medicine

## 2020-05-30 ENCOUNTER — Other Ambulatory Visit: Payer: Self-pay

## 2020-05-30 MED ORDER — AMITRIPTYLINE HCL 25 MG PO TABS: 12.5000 mg | ORAL_TABLET | Freq: Every day | ORAL | 0 refills | Status: DC

## 2020-05-31 ENCOUNTER — Other Ambulatory Visit: Payer: Self-pay | Admitting: Family Medicine

## 2020-05-31 DIAGNOSIS — I4819 Other persistent atrial fibrillation: Secondary | ICD-10-CM | POA: Diagnosis not present

## 2020-05-31 MED ORDER — TAMSULOSIN HCL 0.4 MG PO CAPS: 0.4000 mg | ORAL_CAPSULE | Freq: Every day | ORAL | 0 refills | Status: DC

## 2020-06-25 ENCOUNTER — Other Ambulatory Visit: Payer: Self-pay | Admitting: Family Medicine

## 2020-06-28 ENCOUNTER — Telehealth: Payer: Self-pay | Admitting: Family Medicine

## 2020-06-28 ENCOUNTER — Other Ambulatory Visit: Payer: Self-pay | Admitting: Family Medicine

## 2020-06-28 NOTE — Telephone Encounter (Signed)
Medication:tamsulosin (FLOMAX) 0.4 MG CAPS capsule      Has the patient contacted their pharmacy? Yes.   (If no, request that the patient contact the pharmacy for the refill.) (If yes, when and what did the pharmacy advise?)  Preferred Pharmacy (with phone number or street name): [facility] - [place], [place] - [address]  [address], [place] [address]  Phone:  [phone] Fax:  [phone]   Agent: Please be advised that RX refills may take up to 3 business days. We ask that you follow-up with your pharmacy.

## 2020-06-28 NOTE — Telephone Encounter (Signed)
Sent in today already

## 2020-07-21 DIAGNOSIS — Z23 Encounter for immunization: Secondary | ICD-10-CM | POA: Diagnosis not present

## 2020-08-02 ENCOUNTER — Ambulatory Visit: Payer: Medicare Other | Admitting: Cardiology

## 2020-08-12 ENCOUNTER — Ambulatory Visit (INDEPENDENT_AMBULATORY_CARE_PROVIDER_SITE_OTHER): Payer: Medicare Other | Admitting: Cardiology

## 2020-08-12 ENCOUNTER — Other Ambulatory Visit: Payer: Self-pay

## 2020-08-12 ENCOUNTER — Encounter: Payer: Self-pay | Admitting: Cardiology

## 2020-08-12 ENCOUNTER — Ambulatory Visit (INDEPENDENT_AMBULATORY_CARE_PROVIDER_SITE_OTHER): Payer: Medicare Other | Admitting: Family Medicine

## 2020-08-12 ENCOUNTER — Encounter: Payer: Self-pay | Admitting: Family Medicine

## 2020-08-12 VITALS — BP 126/76 | HR 76 | Temp 98.6°F

## 2020-08-12 VITALS — BP 128/78 | HR 62 | Ht <= 58 in | Wt 225.0 lb

## 2020-08-12 DIAGNOSIS — G894 Chronic pain syndrome: Secondary | ICD-10-CM | POA: Diagnosis not present

## 2020-08-12 DIAGNOSIS — G8929 Other chronic pain: Secondary | ICD-10-CM

## 2020-08-12 DIAGNOSIS — S069X0S Unspecified intracranial injury without loss of consciousness, sequela: Secondary | ICD-10-CM | POA: Diagnosis not present

## 2020-08-12 DIAGNOSIS — B182 Chronic viral hepatitis C: Secondary | ICD-10-CM

## 2020-08-12 DIAGNOSIS — I480 Paroxysmal atrial fibrillation: Secondary | ICD-10-CM | POA: Diagnosis not present

## 2020-08-12 DIAGNOSIS — Z7901 Long term (current) use of anticoagulants: Secondary | ICD-10-CM | POA: Diagnosis not present

## 2020-08-12 DIAGNOSIS — E782 Mixed hyperlipidemia: Secondary | ICD-10-CM

## 2020-08-12 DIAGNOSIS — I420 Dilated cardiomyopathy: Secondary | ICD-10-CM

## 2020-08-12 DIAGNOSIS — F203 Undifferentiated schizophrenia: Secondary | ICD-10-CM

## 2020-08-12 DIAGNOSIS — Z931 Gastrostomy status: Secondary | ICD-10-CM

## 2020-08-12 DIAGNOSIS — G40909 Epilepsy, unspecified, not intractable, without status epilepticus: Secondary | ICD-10-CM

## 2020-08-12 DIAGNOSIS — I10 Essential (primary) hypertension: Secondary | ICD-10-CM

## 2020-08-12 DIAGNOSIS — M549 Dorsalgia, unspecified: Secondary | ICD-10-CM | POA: Diagnosis not present

## 2020-08-12 DIAGNOSIS — F17210 Nicotine dependence, cigarettes, uncomplicated: Secondary | ICD-10-CM

## 2020-08-12 DIAGNOSIS — J449 Chronic obstructive pulmonary disease, unspecified: Secondary | ICD-10-CM | POA: Diagnosis not present

## 2020-08-12 DIAGNOSIS — E1165 Type 2 diabetes mellitus with hyperglycemia: Secondary | ICD-10-CM

## 2020-08-12 DIAGNOSIS — Z89611 Acquired absence of right leg above knee: Secondary | ICD-10-CM | POA: Diagnosis not present

## 2020-08-12 DIAGNOSIS — Z1211 Encounter for screening for malignant neoplasm of colon: Secondary | ICD-10-CM

## 2020-08-12 DIAGNOSIS — Z89612 Acquired absence of left leg above knee: Secondary | ICD-10-CM

## 2020-08-12 MED ORDER — FOLIC ACID 1 MG PO TABS: 1.0000 mg | ORAL_TABLET | Freq: Every day | ORAL | 3 refills | Status: DC

## 2020-08-12 MED ORDER — ATORVASTATIN CALCIUM 40 MG PO TABS: 40.0000 mg | ORAL_TABLET | Freq: Every day | ORAL | 3 refills | Status: DC

## 2020-08-12 MED ORDER — TAMSULOSIN HCL 0.4 MG PO CAPS: 0.4000 mg | ORAL_CAPSULE | Freq: Every day | ORAL | 2 refills | Status: DC

## 2020-08-12 MED ORDER — AMIODARONE HCL 200 MG PO TABS: 200.0000 mg | ORAL_TABLET | Freq: Every day | ORAL | 1 refills | Status: DC

## 2020-08-12 MED ORDER — AMITRIPTYLINE HCL 25 MG PO TABS: 12.5000 mg | ORAL_TABLET | Freq: Every day | ORAL | 2 refills | Status: DC

## 2020-08-12 NOTE — Patient Instructions (Addendum)
Give [place] 2-3 business days to get the results of your labs back.   Keep the diet clean and stay active.  If you do not hear anything about your referrals in the next 1-2 weeks, call our office and ask for an update.  Call the pharmacy to get a tetanus shot.   Let us know if you need anything.
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
## 2020-08-12 NOTE — Patient Instructions (Signed)
Medication Instructions:  Your physician has recommended you make the following change in your medication:   TAKE: Amiodarone 200 mg daily   *If you need a refill on your cardiac medications before your next appointment, please call your pharmacy*   Lab Work: Your physician recommends that you return for lab work today: lipid, cmp, cbc, tsh  If you have labs (blood work) drawn today and your tests are completely normal, you will receive your results only by: Marland Kitchen MyChart Message (if you have MyChart) OR . A paper copy in the mail If you have any lab test that is abnormal or we need to change your treatment, we will call you to review the results.   Testing/Procedures: Your physician has requested that you have an echocardiogram. Echocardiography is a painless test that uses sound waves to create images of your heart. It provides your doctor with information about the size and shape of your heart and how well your heart's chambers and valves are working. This procedure takes approximately one hour. There are no restrictions for this procedure.     Follow-Up: At [facility], you and your health needs are our priority.  As part of our continuing mission to provide you with exceptional heart care, we have created designated Provider Care Teams.  These Care Teams include your primary Cardiologist (physician) and Advanced Practice Providers (APPs -  Physician Assistants and Nurse Practitioners) who all work together to provide you with the care you need, when you need it.  We recommend signing up for the patient portal called "MyChart".  Sign up information is provided on this After Visit Summary.  MyChart is used to connect with patients for Virtual Visits (Telemedicine).  Patients are able to view lab/test results, encounter notes, upcoming appointments, etc.  Non-urgent messages can be sent to your provider as well.   To learn more about what you can do with MyChart, go to  [email or web address].    Your next appointment:   6 month(s)  The format for your next appointment:   In Person  Provider:   Gypsy Balsam, MD   Other Instructions   Echocardiogram An echocardiogram is a test that uses sound waves (ultrasound) to produce images of the heart. Images from an echocardiogram can provide important information about:  Heart size and shape.  The size and thickness and movement of your heart's walls.  Heart muscle function and strength.  Heart valve function or if you have stenosis. Stenosis is when the heart valves are too narrow.  If blood is flowing backward through the heart valves (regurgitation).  A tumor or infectious growth around the heart valves.  Areas of heart muscle that are not working well because of poor blood flow or injury from a heart attack.  Aneurysm detection. An aneurysm is a weak or damaged part of an artery wall. The wall bulges out from the normal force of blood pumping through the body. Tell a health care provider about:  Any allergies you have.  All medicines you are taking, including vitamins, herbs, eye drops, creams, and over-the-counter medicines.  Any blood disorders you have.  Any surgeries you have had.  Any medical conditions you have.  Whether you are pregnant or may be pregnant. What are the risks? Generally, this is a safe test. However, problems may occur, including an allergic reaction to dye (contrast) that may be used during the test. What happens before the test? No specific preparation is needed. You may eat and drink normally.  What happens during the test?  You will take off your clothes from the waist up and put on a hospital gown.  Electrodes or electrocardiogram (ECG)patches may be placed on your chest. The electrodes or patches are then connected to a device that monitors your heart rate and rhythm.  You will lie down on a table for an ultrasound exam. A gel will be applied to  your chest to help sound waves pass through your skin.  A handheld device, called a transducer, will be pressed against your chest and moved over your heart. The transducer produces sound waves that travel to your heart and bounce back (or "echo" back) to the transducer. These sound waves will be captured in real-time and changed into images of your heart that can be viewed on a video monitor. The images will be recorded on a computer and reviewed by your health care provider.  You may be asked to change positions or hold your breath for a short time. This makes it easier to get different views or better views of your heart.  In some cases, you may receive contrast through an IV in one of your veins. This can improve the quality of the pictures from your heart. The procedure may vary among health care providers and hospitals.   What can I expect after the test? You may return to your normal, everyday life, including diet, activities, and medicines, unless your health care provider tells you not to do that. Follow these instructions at home:  It is up to you to get the results of your test. Ask your health care provider, or the department that is doing the test, when your results will be ready.  Keep all follow-up visits. This is important. Summary  An echocardiogram is a test that uses sound waves (ultrasound) to produce images of the heart.  Images from an echocardiogram can provide important information about the size and shape of your heart, heart muscle function, heart valve function, and other possible heart problems.  You do not need to do anything to prepare before this test. You may eat and drink normally.  After the echocardiogram is completed, you may return to your normal, everyday life, unless your health care provider tells you not to do that. This information is not intended to replace advice given to you by your health care provider. Make sure you discuss any questions you have  with your health care provider. Document Revised: 12/15/2019 Document Reviewed: 12/15/2019 Elsevier Patient Education  2021 [employer] Inc.   

## 2020-08-13 LAB — TSH: TSH: 1.89 u[IU]/mL (ref 0.450–4.500)

## 2020-08-13 LAB — COMPREHENSIVE METABOLIC PANEL
AG Ratio: 1.4 (calc) (ref 1.0–2.5)
ALT: 20 IU/L (ref 0–44)
ALT: 19 U/L (ref 9–46)
AST: 20 IU/L (ref 0–40)
AST: 21 U/L (ref 10–35)
Albumin/Globulin Ratio: 1.6 (ref 1.2–2.2)
Albumin: 4.2 g/dL (ref 3.8–4.8)
Albumin: 4 g/dL (ref 3.6–5.1)
Alkaline Phosphatase: 105 IU/L (ref 44–121)
Alkaline phosphatase (APISO): 89 U/L (ref 35–144)
BUN/Creatinine Ratio: 10 (ref 10–24)
BUN: 11 mg/dL (ref 8–27)
BUN: 13 mg/dL (ref 7–25)
Bilirubin Total: 0.3 mg/dL (ref 0.0–1.2)
CO2: 19 mmol/L — ABNORMAL LOW (ref 20–29)
CO2: 24 mmol/L (ref 20–32)
Calcium: 9.1 mg/dL (ref 8.6–10.2)
Calcium: 8.9 mg/dL (ref 8.6–10.3)
Chloride: 102 mmol/L (ref 96–106)
Chloride: 106 mmol/L (ref 98–110)
Creat: 1.02 mg/dL (ref 0.70–1.25)
Creatinine, Ser: 1.14 mg/dL (ref 0.76–1.27)
Globulin, Total: 2.6 g/dL (ref 1.5–4.5)
Globulin: 2.9 g/dL (calc) (ref 1.9–3.7)
Glucose, Bld: 217 mg/dL — ABNORMAL HIGH (ref 65–99)
Glucose: 163 mg/dL — ABNORMAL HIGH (ref 65–99)
Potassium: 4.3 mmol/L (ref 3.5–5.2)
Potassium: 3.9 mmol/L (ref 3.5–5.3)
Sodium: 139 mmol/L (ref 134–144)
Sodium: 141 mmol/L (ref 135–146)
Total Bilirubin: 0.4 mg/dL (ref 0.2–1.2)
Total Protein: 6.8 g/dL (ref 6.0–8.5)
Total Protein: 6.9 g/dL (ref 6.1–8.1)
eGFR: 72 mL/min/{1.73_m2} (ref >59)

## 2020-08-13 LAB — LIPID PANEL
Cholesterol: 217 mg/dL — ABNORMAL HIGH (ref <200)
HDL: 46 mg/dL (ref >=40)
LDL Cholesterol (Calc): 137 mg/dL (calc) — ABNORMAL HIGH
Non-HDL Cholesterol (Calc): 171 mg/dL (calc) — ABNORMAL HIGH (ref <130)
Total CHOL/HDL Ratio: 4.7 (calc) (ref <5.0)
Triglycerides: 202 mg/dL — ABNORMAL HIGH (ref <150)

## 2020-08-13 LAB — CBC
HCT: 46.1 % (ref 38.5–50.0)
Hematocrit: 46.9 % (ref 37.5–51.0)
Hemoglobin: 15.5 g/dL (ref 13.0–17.7)
Hemoglobin: 14.9 g/dL (ref 13.2–17.1)
MCH: 30.3 pg (ref 26.6–33.0)
MCH: 30 pg (ref 27.0–33.0)
MCHC: 33 g/dL (ref 31.5–35.7)
MCHC: 32.3 g/dL (ref 32.0–36.0)
MCV: 92 fL (ref 79–97)
MCV: 92.9 fL (ref 80.0–100.0)
MPV: 12.2 fL (ref 7.5–12.5)
Platelets: 203 10*3/uL (ref 150–450)
Platelets: 209 10*3/uL (ref 140–400)
RBC: 5.11 x10E6/uL (ref 4.14–5.80)
RBC: 4.96 10*6/uL (ref 4.20–5.80)
RDW: 13.3 % (ref 11.6–15.4)
RDW: 13 % (ref 11.0–15.0)
WBC: 11.5 10*3/uL — ABNORMAL HIGH (ref 3.4–10.8)
WBC: 10.9 10*3/uL — ABNORMAL HIGH (ref 3.8–10.8)

## 2020-08-13 LAB — HEMOGLOBIN A1C
Hgb A1c MFr Bld: 8.8 % of total Hgb — ABNORMAL HIGH (ref <5.7)
Mean Plasma Glucose: 206 mg/dL
eAG (mmol/L): 11.4 mmol/L

## 2020-08-15 ENCOUNTER — Other Ambulatory Visit: Payer: Self-pay | Admitting: Family Medicine

## 2020-08-15 MED ORDER — METFORMIN HCL 500 MG PO TABS: ORAL_TABLET | ORAL | 1 refills | Status: DC

## 2020-08-31 ENCOUNTER — Other Ambulatory Visit: Payer: Self-pay

## 2020-08-31 MED ORDER — AMIODARONE HCL 200 MG PO TABS: 200.0000 mg | ORAL_TABLET | Freq: Every day | ORAL | 3 refills | Status: DC

## 2020-08-31 NOTE — Telephone Encounter (Signed)
Refill of Amiodarone 200 mg sent to [facility].

## 2020-09-21 ENCOUNTER — Ambulatory Visit (HOSPITAL_BASED_OUTPATIENT_CLINIC_OR_DEPARTMENT_OTHER): Admission: RE | Admit: 2020-09-21 | Payer: Medicare Other | Source: Ambulatory Visit

## 2020-09-23 ENCOUNTER — Other Ambulatory Visit: Payer: Self-pay | Admitting: Family Medicine

## 2020-10-17 ENCOUNTER — Ambulatory Visit (INDEPENDENT_AMBULATORY_CARE_PROVIDER_SITE_OTHER): Payer: Medicare Other | Admitting: Family Medicine

## 2020-10-17 ENCOUNTER — Encounter: Payer: Self-pay | Admitting: Family Medicine

## 2020-10-17 ENCOUNTER — Other Ambulatory Visit: Payer: Self-pay

## 2020-10-17 VITALS — BP 132/80 | HR 86 | Temp 98.7°F

## 2020-10-17 DIAGNOSIS — E1165 Type 2 diabetes mellitus with hyperglycemia: Secondary | ICD-10-CM | POA: Diagnosis not present

## 2020-10-17 DIAGNOSIS — Z0289 Encounter for other administrative examinations: Secondary | ICD-10-CM | POA: Diagnosis not present

## 2020-10-17 MED ORDER — METFORMIN HCL ER 750 MG PO TB24: 750.0000 mg | ORAL_TABLET | Freq: Every day | ORAL | 5 refills | Status: DC

## 2020-10-17 NOTE — Patient Instructions (Addendum)
Let us know if you need anything further.  Me know if there are cost issues with the new metformin.   Let us know if you need anything.
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
## 2020-10-24 ENCOUNTER — Other Ambulatory Visit: Payer: Self-pay | Admitting: Cardiology

## 2020-10-24 NOTE — Telephone Encounter (Signed)
Refill sent to pharmacy.   

## 2020-10-27 ENCOUNTER — Ambulatory Visit (HOSPITAL_COMMUNITY)
Admission: RE | Admit: 2020-10-27 | Discharge: 2020-10-27 | Disposition: A | Discharge status: Home / Self Care | Payer: Medicare Other | Source: Ambulatory Visit | Attending: Cardiology | Admitting: Cardiology

## 2020-10-27 ENCOUNTER — Other Ambulatory Visit: Payer: Self-pay

## 2020-10-27 DIAGNOSIS — I10 Essential (primary) hypertension: Secondary | ICD-10-CM | POA: Diagnosis not present

## 2020-10-27 DIAGNOSIS — I420 Dilated cardiomyopathy: Secondary | ICD-10-CM | POA: Diagnosis not present

## 2020-10-27 DIAGNOSIS — E119 Type 2 diabetes mellitus without complications: Secondary | ICD-10-CM | POA: Diagnosis not present

## 2020-10-27 DIAGNOSIS — Z7901 Long term (current) use of anticoagulants: Secondary | ICD-10-CM | POA: Diagnosis not present

## 2020-10-27 DIAGNOSIS — F17210 Nicotine dependence, cigarettes, uncomplicated: Secondary | ICD-10-CM | POA: Diagnosis not present

## 2020-10-27 DIAGNOSIS — I4891 Unspecified atrial fibrillation: Secondary | ICD-10-CM

## 2020-10-27 DIAGNOSIS — J449 Chronic obstructive pulmonary disease, unspecified: Secondary | ICD-10-CM | POA: Diagnosis not present

## 2020-10-27 DIAGNOSIS — I252 Old myocardial infarction: Secondary | ICD-10-CM | POA: Diagnosis not present

## 2020-10-27 DIAGNOSIS — I480 Paroxysmal atrial fibrillation: Secondary | ICD-10-CM | POA: Diagnosis not present

## 2020-10-27 LAB — ECHOCARDIOGRAM COMPLETE
Area-P 1/2: 1.91 cm2
Calc EF: 46.6 %
S' Lateral: 4.7 cm
Single Plane A2C EF: 45.1 %
Single Plane A4C EF: 48.8 %

## 2020-10-27 NOTE — Progress Notes (Signed)
  Echocardiogram 2D Echocardiogram has been performed.  Augustine Radar 10/27/2020, 4:07 PM

## 2020-10-31 ENCOUNTER — Telehealth: Payer: Self-pay | Admitting: Emergency Medicine

## 2020-10-31 DIAGNOSIS — Z79899 Other long term (current) drug therapy: Secondary | ICD-10-CM

## 2020-10-31 NOTE — Telephone Encounter (Signed)
-----   Message from Georgeanna Lea, MD sent at 10/31/2020 11:16 AM EDT ----- Echocardiogram showed ejection fraction 40 to 45% which is mildly diminished, everything else looks fine.  Please ask him to have Chem-7 done if Chem-7 will be fine then we will increase dose of Entresto

## 2020-10-31 NOTE — Telephone Encounter (Signed)
Patient informed of results. He will come for labs.

## 2020-11-01 DIAGNOSIS — Z20822 Contact with and (suspected) exposure to covid-19: Secondary | ICD-10-CM | POA: Diagnosis not present

## 2020-12-19 ENCOUNTER — Other Ambulatory Visit: Payer: Self-pay | Admitting: Family Medicine

## 2021-01-04 ENCOUNTER — Telehealth: Payer: Self-pay | Admitting: Family Medicine

## 2021-01-04 NOTE — Telephone Encounter (Signed)
PT called to let us know that documentation will be faxed over regarding his motorized wheelchair. He stats company needs approval from MD before being able to fix his wheelchair.

## 2021-01-12 ENCOUNTER — Telehealth: Payer: Self-pay | Admitting: Family Medicine

## 2021-01-12 NOTE — Telephone Encounter (Signed)
Left message for patient to call back and schedule Medicare Annual Wellness Visit (AWV) in office.  ° °If not able to come in office, please offer to do virtually or by telephone.  Left office number and my jabber #[phone]. ° °Due for AWVI ° °Please schedule at anytime with Nurse Health Advisor. °  °

## 2021-01-13 DIAGNOSIS — G8929 Other chronic pain: Secondary | ICD-10-CM | POA: Diagnosis not present

## 2021-01-13 DIAGNOSIS — Z79899 Other long term (current) drug therapy: Secondary | ICD-10-CM | POA: Diagnosis not present

## 2021-01-13 DIAGNOSIS — Z7901 Long term (current) use of anticoagulants: Secondary | ICD-10-CM | POA: Diagnosis not present

## 2021-01-13 DIAGNOSIS — M25551 Pain in right hip: Secondary | ICD-10-CM | POA: Diagnosis not present

## 2021-01-13 DIAGNOSIS — M5416 Radiculopathy, lumbar region: Secondary | ICD-10-CM | POA: Diagnosis not present

## 2021-01-16 ENCOUNTER — Other Ambulatory Visit: Payer: Self-pay | Admitting: Family Medicine

## 2021-01-27 DIAGNOSIS — M25551 Pain in right hip: Secondary | ICD-10-CM | POA: Diagnosis not present

## 2021-01-27 DIAGNOSIS — M5416 Radiculopathy, lumbar region: Secondary | ICD-10-CM | POA: Diagnosis not present

## 2021-01-27 DIAGNOSIS — S78119A Complete traumatic amputation at level between unspecified hip and knee, initial encounter: Secondary | ICD-10-CM | POA: Diagnosis not present

## 2021-01-27 DIAGNOSIS — G8929 Other chronic pain: Secondary | ICD-10-CM | POA: Diagnosis not present

## 2021-02-15 ENCOUNTER — Ambulatory Visit: Payer: Medicare Other | Admitting: Family Medicine

## 2021-02-16 DIAGNOSIS — I10 Essential (primary) hypertension: Secondary | ICD-10-CM | POA: Diagnosis not present

## 2021-02-16 DIAGNOSIS — F17210 Nicotine dependence, cigarettes, uncomplicated: Secondary | ICD-10-CM | POA: Diagnosis not present

## 2021-02-16 DIAGNOSIS — Z125 Encounter for screening for malignant neoplasm of prostate: Secondary | ICD-10-CM | POA: Diagnosis not present

## 2021-02-16 DIAGNOSIS — I420 Dilated cardiomyopathy: Secondary | ICD-10-CM | POA: Diagnosis not present

## 2021-02-16 DIAGNOSIS — Z0000 Encounter for general adult medical examination without abnormal findings: Secondary | ICD-10-CM | POA: Diagnosis not present

## 2021-02-16 DIAGNOSIS — E7800 Pure hypercholesterolemia, unspecified: Secondary | ICD-10-CM | POA: Diagnosis not present

## 2021-02-16 DIAGNOSIS — B182 Chronic viral hepatitis C: Secondary | ICD-10-CM | POA: Diagnosis not present

## 2021-02-16 DIAGNOSIS — Z79899 Other long term (current) drug therapy: Secondary | ICD-10-CM | POA: Diagnosis not present

## 2021-02-16 DIAGNOSIS — M129 Arthropathy, unspecified: Secondary | ICD-10-CM | POA: Diagnosis not present

## 2021-02-16 DIAGNOSIS — Z87891 Personal history of nicotine dependence: Secondary | ICD-10-CM | POA: Diagnosis not present

## 2021-02-16 DIAGNOSIS — Z6841 Body Mass Index (BMI) 40.0 and over, adult: Secondary | ICD-10-CM | POA: Diagnosis not present

## 2021-02-16 DIAGNOSIS — Z1159 Encounter for screening for other viral diseases: Secondary | ICD-10-CM | POA: Diagnosis not present

## 2021-02-16 DIAGNOSIS — E559 Vitamin D deficiency, unspecified: Secondary | ICD-10-CM | POA: Diagnosis not present

## 2021-02-16 DIAGNOSIS — I480 Paroxysmal atrial fibrillation: Secondary | ICD-10-CM | POA: Diagnosis not present

## 2021-02-16 DIAGNOSIS — Z114 Encounter for screening for human immunodeficiency virus [HIV]: Secondary | ICD-10-CM | POA: Diagnosis not present

## 2021-02-28 ENCOUNTER — Ambulatory Visit: Payer: Medicare Other | Admitting: Family Medicine

## 2021-03-06 DIAGNOSIS — Z87891 Personal history of nicotine dependence: Secondary | ICD-10-CM | POA: Diagnosis not present

## 2021-03-10 DIAGNOSIS — B182 Chronic viral hepatitis C: Secondary | ICD-10-CM | POA: Diagnosis not present

## 2021-03-14 DIAGNOSIS — M25551 Pain in right hip: Secondary | ICD-10-CM | POA: Diagnosis not present

## 2021-03-14 DIAGNOSIS — M5416 Radiculopathy, lumbar region: Secondary | ICD-10-CM | POA: Diagnosis not present

## 2021-03-14 DIAGNOSIS — Z79899 Other long term (current) drug therapy: Secondary | ICD-10-CM | POA: Diagnosis not present

## 2021-03-14 DIAGNOSIS — G8929 Other chronic pain: Secondary | ICD-10-CM | POA: Diagnosis not present

## 2021-03-14 DIAGNOSIS — S78119A Complete traumatic amputation at level between unspecified hip and knee, initial encounter: Secondary | ICD-10-CM | POA: Diagnosis not present

## 2021-03-16 ENCOUNTER — Other Ambulatory Visit: Payer: Self-pay | Admitting: Family Medicine

## 2021-03-16 DIAGNOSIS — E1165 Type 2 diabetes mellitus with hyperglycemia: Secondary | ICD-10-CM

## 2021-03-17 DIAGNOSIS — M25551 Pain in right hip: Secondary | ICD-10-CM | POA: Diagnosis not present

## 2021-03-17 DIAGNOSIS — Z79899 Other long term (current) drug therapy: Secondary | ICD-10-CM | POA: Diagnosis not present

## 2021-03-17 DIAGNOSIS — E1151 Type 2 diabetes mellitus with diabetic peripheral angiopathy without gangrene: Secondary | ICD-10-CM | POA: Diagnosis not present

## 2021-03-17 DIAGNOSIS — Z89611 Acquired absence of right leg above knee: Secondary | ICD-10-CM | POA: Diagnosis not present

## 2021-03-17 DIAGNOSIS — Z89612 Acquired absence of left leg above knee: Secondary | ICD-10-CM | POA: Diagnosis not present

## 2021-03-17 DIAGNOSIS — S72001A Fracture of unspecified part of neck of right femur, initial encounter for closed fracture: Secondary | ICD-10-CM | POA: Diagnosis not present

## 2021-04-12 DIAGNOSIS — G8929 Other chronic pain: Secondary | ICD-10-CM | POA: Diagnosis not present

## 2021-04-12 DIAGNOSIS — Z79899 Other long term (current) drug therapy: Secondary | ICD-10-CM | POA: Diagnosis not present

## 2021-04-12 DIAGNOSIS — S78119A Complete traumatic amputation at level between unspecified hip and knee, initial encounter: Secondary | ICD-10-CM | POA: Diagnosis not present

## 2021-04-12 DIAGNOSIS — M5416 Radiculopathy, lumbar region: Secondary | ICD-10-CM | POA: Diagnosis not present

## 2021-04-12 DIAGNOSIS — M25551 Pain in right hip: Secondary | ICD-10-CM | POA: Diagnosis not present

## 2021-04-17 DIAGNOSIS — F419 Anxiety disorder, unspecified: Secondary | ICD-10-CM | POA: Diagnosis not present

## 2021-04-17 DIAGNOSIS — K760 Fatty (change of) liver, not elsewhere classified: Secondary | ICD-10-CM | POA: Diagnosis not present

## 2021-04-17 DIAGNOSIS — I10 Essential (primary) hypertension: Secondary | ICD-10-CM | POA: Diagnosis not present

## 2021-04-17 DIAGNOSIS — E7800 Pure hypercholesterolemia, unspecified: Secondary | ICD-10-CM | POA: Diagnosis not present

## 2021-04-17 DIAGNOSIS — Z23 Encounter for immunization: Secondary | ICD-10-CM | POA: Diagnosis not present

## 2021-04-17 DIAGNOSIS — F17210 Nicotine dependence, cigarettes, uncomplicated: Secondary | ICD-10-CM | POA: Diagnosis not present

## 2021-04-23 ENCOUNTER — Encounter (HOSPITAL_COMMUNITY): Payer: Self-pay | Admitting: Family Medicine

## 2021-04-23 ENCOUNTER — Emergency Department (HOSPITAL_COMMUNITY): Payer: Medicare Other

## 2021-04-23 ENCOUNTER — Observation Stay (HOSPITAL_COMMUNITY)
Admission: EM | Admit: 2021-04-23 | Discharge: 2021-04-24 | Disposition: A | Discharge status: Home / Self Care | Payer: Medicare Other | Attending: Emergency Medicine | Admitting: Emergency Medicine

## 2021-04-23 DIAGNOSIS — I4891 Unspecified atrial fibrillation: Secondary | ICD-10-CM | POA: Diagnosis present

## 2021-04-23 DIAGNOSIS — R0902 Hypoxemia: Secondary | ICD-10-CM | POA: Diagnosis not present

## 2021-04-23 DIAGNOSIS — R0602 Shortness of breath: Secondary | ICD-10-CM

## 2021-04-23 DIAGNOSIS — Z7984 Long term (current) use of oral hypoglycemic drugs: Secondary | ICD-10-CM | POA: Insufficient documentation

## 2021-04-23 DIAGNOSIS — F203 Undifferentiated schizophrenia: Secondary | ICD-10-CM | POA: Diagnosis not present

## 2021-04-23 DIAGNOSIS — T50901A Poisoning by unspecified drugs, medicaments and biological substances, accidental (unintentional), initial encounter: Secondary | ICD-10-CM | POA: Diagnosis not present

## 2021-04-23 DIAGNOSIS — R410 Disorientation, unspecified: Secondary | ICD-10-CM | POA: Diagnosis not present

## 2021-04-23 DIAGNOSIS — R0689 Other abnormalities of breathing: Secondary | ICD-10-CM | POA: Diagnosis not present

## 2021-04-23 DIAGNOSIS — Y900 Blood alcohol level of less than 20 mg/100 ml: Secondary | ICD-10-CM | POA: Diagnosis not present

## 2021-04-23 DIAGNOSIS — I4821 Permanent atrial fibrillation: Secondary | ICD-10-CM

## 2021-04-23 DIAGNOSIS — M7989 Other specified soft tissue disorders: Secondary | ICD-10-CM | POA: Diagnosis not present

## 2021-04-23 DIAGNOSIS — F319 Bipolar disorder, unspecified: Secondary | ICD-10-CM | POA: Diagnosis not present

## 2021-04-23 DIAGNOSIS — I110 Hypertensive heart disease with heart failure: Secondary | ICD-10-CM | POA: Diagnosis not present

## 2021-04-23 DIAGNOSIS — S022XXA Fracture of nasal bones, initial encounter for closed fracture: Secondary | ICD-10-CM | POA: Diagnosis not present

## 2021-04-23 DIAGNOSIS — Z91040 Latex allergy status: Secondary | ICD-10-CM | POA: Diagnosis not present

## 2021-04-23 DIAGNOSIS — M6282 Rhabdomyolysis: Secondary | ICD-10-CM

## 2021-04-23 DIAGNOSIS — J9811 Atelectasis: Secondary | ICD-10-CM | POA: Diagnosis not present

## 2021-04-23 DIAGNOSIS — M2578 Osteophyte, vertebrae: Secondary | ICD-10-CM | POA: Diagnosis not present

## 2021-04-23 DIAGNOSIS — J811 Chronic pulmonary edema: Secondary | ICD-10-CM | POA: Diagnosis not present

## 2021-04-23 DIAGNOSIS — J449 Chronic obstructive pulmonary disease, unspecified: Secondary | ICD-10-CM | POA: Diagnosis not present

## 2021-04-23 DIAGNOSIS — R400 Somnolence: Secondary | ICD-10-CM | POA: Diagnosis not present

## 2021-04-23 DIAGNOSIS — R4182 Altered mental status, unspecified: Principal | ICD-10-CM | POA: Diagnosis present

## 2021-04-23 DIAGNOSIS — I5023 Acute on chronic systolic (congestive) heart failure: Secondary | ICD-10-CM | POA: Insufficient documentation

## 2021-04-23 DIAGNOSIS — R402411 Glasgow coma scale score 13-15, in the field [EMT or ambulance]: Secondary | ICD-10-CM | POA: Diagnosis not present

## 2021-04-23 DIAGNOSIS — E119 Type 2 diabetes mellitus without complications: Secondary | ICD-10-CM | POA: Insufficient documentation

## 2021-04-23 DIAGNOSIS — M4312 Spondylolisthesis, cervical region: Secondary | ICD-10-CM | POA: Diagnosis not present

## 2021-04-23 DIAGNOSIS — Z7901 Long term (current) use of anticoagulants: Secondary | ICD-10-CM | POA: Insufficient documentation

## 2021-04-23 DIAGNOSIS — I480 Paroxysmal atrial fibrillation: Secondary | ICD-10-CM | POA: Diagnosis not present

## 2021-04-23 DIAGNOSIS — Z79899 Other long term (current) drug therapy: Secondary | ICD-10-CM | POA: Insufficient documentation

## 2021-04-23 DIAGNOSIS — I517 Cardiomegaly: Secondary | ICD-10-CM | POA: Diagnosis not present

## 2021-04-23 DIAGNOSIS — Z20822 Contact with and (suspected) exposure to covid-19: Secondary | ICD-10-CM | POA: Insufficient documentation

## 2021-04-23 DIAGNOSIS — R404 Transient alteration of awareness: Secondary | ICD-10-CM | POA: Diagnosis not present

## 2021-04-23 DIAGNOSIS — F209 Schizophrenia, unspecified: Secondary | ICD-10-CM | POA: Diagnosis present

## 2021-04-23 DIAGNOSIS — F17210 Nicotine dependence, cigarettes, uncomplicated: Secondary | ICD-10-CM | POA: Insufficient documentation

## 2021-04-23 LAB — CBC WITH DIFFERENTIAL/PLATELET
Abs Immature Granulocytes: 0.09 10*3/uL — ABNORMAL HIGH (ref 0.00–0.07)
Basophils Absolute: 0 10*3/uL (ref 0.0–0.1)
Basophils Relative: 0 %
Eosinophils Absolute: 0 10*3/uL (ref 0.0–0.5)
Eosinophils Relative: 0 %
HCT: 42.5 % (ref 39.0–52.0)
Hemoglobin: 13.8 g/dL (ref 13.0–17.0)
Immature Granulocytes: 1 %
Lymphocytes Relative: 6 %
Lymphs Abs: 1.1 10*3/uL (ref 0.7–4.0)
MCH: 30 pg (ref 26.0–34.0)
MCHC: 32.5 g/dL (ref 30.0–36.0)
MCV: 92.4 fL (ref 80.0–100.0)
Monocytes Absolute: 1.5 10*3/uL — ABNORMAL HIGH (ref 0.1–1.0)
Monocytes Relative: 8 %
Neutro Abs: 15.4 10*3/uL — ABNORMAL HIGH (ref 1.7–7.7)
Neutrophils Relative %: 85 %
Platelets: 325 10*3/uL (ref 150–400)
RBC: 4.6 MIL/uL (ref 4.22–5.81)
RDW: 13.5 % (ref 11.5–15.5)
WBC: 18.2 10*3/uL — ABNORMAL HIGH (ref 4.0–10.5)
nRBC: 0 % (ref 0.0–0.2)

## 2021-04-23 LAB — URINALYSIS, ROUTINE W REFLEX MICROSCOPIC
Glucose, UA: 100 mg/dL — AB
Ketones, ur: 15 mg/dL — AB
Leukocytes,Ua: NEGATIVE
Nitrite: POSITIVE — AB
Protein, ur: 30 mg/dL — AB
Specific Gravity, Urine: 1.025 (ref 1.005–1.030)
pH: 6 (ref 5.0–8.0)

## 2021-04-23 LAB — PROTIME-INR
INR: 1.1 (ref 0.8–1.2)
Prothrombin Time: 13.7 seconds (ref 11.4–15.2)

## 2021-04-23 LAB — COMPREHENSIVE METABOLIC PANEL
ALT: 56 U/L — ABNORMAL HIGH (ref 0–44)
AST: 146 U/L — ABNORMAL HIGH (ref 15–41)
Albumin: 3 g/dL — ABNORMAL LOW (ref 3.5–5.0)
Alkaline Phosphatase: 68 U/L (ref 38–126)
Anion gap: 13 (ref 5–15)
BUN: 25 mg/dL — ABNORMAL HIGH (ref 8–23)
CO2: 21 mmol/L — ABNORMAL LOW (ref 22–32)
Calcium: 8.7 mg/dL — ABNORMAL LOW (ref 8.9–10.3)
Chloride: 102 mmol/L (ref 98–111)
Creatinine, Ser: 1.2 mg/dL (ref 0.61–1.24)
GFR, Estimated: >60 mL/min (ref >60)
Glucose, Bld: 241 mg/dL — ABNORMAL HIGH (ref 70–99)
Potassium: 3.6 mmol/L (ref 3.5–5.1)
Sodium: 136 mmol/L (ref 135–145)
Total Bilirubin: 1.5 mg/dL — ABNORMAL HIGH (ref 0.3–1.2)
Total Protein: 6.8 g/dL (ref 6.5–8.1)

## 2021-04-23 LAB — SALICYLATE LEVEL: Salicylate Lvl: <7 mg/dL — ABNORMAL LOW (ref 7.0–30.0)

## 2021-04-23 LAB — ETHANOL: Alcohol, Ethyl (B): <10 mg/dL (ref <10)

## 2021-04-23 LAB — I-STAT CHEM 8, ED
BUN: 31 mg/dL — ABNORMAL HIGH (ref 8–23)
Calcium, Ion: 1.1 mmol/L — ABNORMAL LOW (ref 1.15–1.40)
Chloride: 102 mmol/L (ref 98–111)
Creatinine, Ser: 0.8 mg/dL (ref 0.61–1.24)
Glucose, Bld: 191 mg/dL — ABNORMAL HIGH (ref 70–99)
HCT: 43 % (ref 39.0–52.0)
Hemoglobin: 14.6 g/dL (ref 13.0–17.0)
Potassium: 3.8 mmol/L (ref 3.5–5.1)
Sodium: 137 mmol/L (ref 135–145)
TCO2: 25 mmol/L (ref 22–32)

## 2021-04-23 LAB — RAPID URINE DRUG SCREEN, HOSP PERFORMED
Amphetamines: NOT DETECTED
Barbiturates: NOT DETECTED
Benzodiazepines: POSITIVE — AB
Cocaine: NOT DETECTED
Opiates: POSITIVE — AB
Tetrahydrocannabinol: POSITIVE — AB

## 2021-04-23 LAB — I-STAT VENOUS BLOOD GAS, ED
Acid-Base Excess: 2 mmol/L (ref 0.0–2.0)
Bicarbonate: 26.6 mmol/L (ref 20.0–28.0)
Calcium, Ion: 1.11 mmol/L — ABNORMAL LOW (ref 1.15–1.40)
HCT: 40 % (ref 39.0–52.0)
Hemoglobin: 13.6 g/dL (ref 13.0–17.0)
O2 Saturation: 79 %
Patient temperature: 98.5
Potassium: 3.7 mmol/L (ref 3.5–5.1)
Sodium: 138 mmol/L (ref 135–145)
TCO2: 28 mmol/L (ref 22–32)
pCO2, Ven: 40.2 mmHg — ABNORMAL LOW (ref 44.0–60.0)
pH, Ven: 7.428 (ref 7.250–7.430)
pO2, Ven: 42 mmHg (ref 32.0–45.0)

## 2021-04-23 LAB — CK: Total CK: 6928 U/L — ABNORMAL HIGH (ref 49–397)

## 2021-04-23 LAB — BRAIN NATRIURETIC PEPTIDE: B Natriuretic Peptide: 28.2 pg/mL (ref 0.0–100.0)

## 2021-04-23 LAB — RESP PANEL BY RT-PCR (FLU A&B, COVID) ARPGX2
Influenza A by PCR: NEGATIVE
Influenza B by PCR: NEGATIVE
SARS Coronavirus 2 by RT PCR: NEGATIVE

## 2021-04-23 LAB — LIPASE, BLOOD: Lipase: 46 U/L (ref 11–51)

## 2021-04-23 LAB — URINALYSIS, MICROSCOPIC (REFLEX): WBC, UA: >50 WBC/hpf (ref 0–5)

## 2021-04-23 LAB — AMMONIA: Ammonia: 28 umol/L (ref 9–35)

## 2021-04-23 LAB — LACTIC ACID, PLASMA
Lactic Acid, Venous: 2.5 mmol/L (ref 0.5–1.9)
Lactic Acid, Venous: 1.7 mmol/L (ref 0.5–1.9)

## 2021-04-23 LAB — DIGOXIN LEVEL: Digoxin Level: 0.3 ng/mL — ABNORMAL LOW (ref 0.8–2.0)

## 2021-04-23 LAB — APTT: aPTT: 27 seconds (ref 24–36)

## 2021-04-23 LAB — HIV ANTIBODY (ROUTINE TESTING W REFLEX): HIV Screen 4th Generation wRfx: NONREACTIVE

## 2021-04-23 LAB — ACETAMINOPHEN LEVEL: Acetaminophen (Tylenol), Serum: <10 ug/mL — ABNORMAL LOW (ref 10–30)

## 2021-04-23 MED ORDER — VANCOMYCIN HCL 2000 MG/400ML IV SOLN
2000.0000 mg | Freq: Once | INTRAVENOUS | Status: AC
  Administered 2021-04-23: 20:00:00 2000 mg via INTRAVENOUS
  Filled 2021-04-23 (×2): qty 400

## 2021-04-23 MED ORDER — APIXABAN 5 MG PO TABS
5.0000 mg | ORAL_TABLET | Freq: Two times a day (BID) | ORAL | Status: DC
  Administered 2021-04-23: 21:00:00 5 mg via ORAL
  Filled 2021-04-23: qty 1

## 2021-04-23 MED ORDER — SODIUM CHLORIDE 0.9 % IV SOLN
2.0000 g | INTRAVENOUS | Status: DC
  Administered 2021-04-24: 02:00:00 2 g via INTRAVENOUS
  Filled 2021-04-23: qty 20

## 2021-04-23 MED ORDER — VANCOMYCIN HCL 1500 MG/300ML IV SOLN: 1500.0000 mg | INTRAVENOUS | Status: DC

## 2021-04-23 MED ORDER — SODIUM CHLORIDE 0.9 % IV SOLN
2.0000 g | Freq: Once | INTRAVENOUS | Status: AC
  Administered 2021-04-23: 19:00:00 2 g via INTRAVENOUS
  Filled 2021-04-23: qty 2

## 2021-04-23 MED ORDER — SODIUM CHLORIDE 0.9 % IV BOLUS
30.0000 mL/kg | Freq: Once | INTRAVENOUS | Status: AC
  Administered 2021-04-23: 17:00:00 3063 mL via INTRAVENOUS

## 2021-04-23 MED ORDER — NALOXONE HCL 0.4 MG/ML IJ SOLN
0.4000 mg | Freq: Once | INTRAMUSCULAR | Status: AC
  Administered 2021-04-23: 14:00:00 0.4 mg via INTRAVENOUS
  Filled 2021-04-23: qty 1

## 2021-04-23 MED ORDER — SODIUM CHLORIDE 0.9 % IV SOLN: INTRAVENOUS | Status: DC

## 2021-04-23 MED ORDER — ENOXAPARIN SODIUM 40 MG/0.4ML IJ SOSY: 40.0000 mg | PREFILLED_SYRINGE | INTRAMUSCULAR | Status: DC

## 2021-04-23 NOTE — ED Notes (Signed)
This Clinical research associate and [place], PA witnessed the pt's SpO2 sats drop to 86% RA. Pt given 2L [place]. SpO2 sats are maintaining at 93%. Will continue to monitor.

## 2021-04-23 NOTE — ED Notes (Signed)
Pt continuing to be apneic. [place], PA put pt on 10L NRB.
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
## 2021-04-23 NOTE — ED Provider Notes (Addendum)
I provided a substantive portion of the care of this patient.  I personally performed the entirety of the medical decision making for this encounter.     HPI reviewed.  Patient interviewed.  He can answer some questions appropriately was also tangential and moderately confused.  Limited overall historian.  Patient found down on his floor with multiple pills scattered about him.  Patient is alert but confused.  Answer some questions appropriately within wanders off subject.  Abrasions to his face.  Bilateral breath sounds.  Heart regular.  Abdomen soft but has diffuse erythema over the anterior abdomen.  AKA of bilateral lower extremity are well-healed but both have diffuse erythema associated.  Patient exhibiting significant confusion in the setting of possible overdose with dehydration.  Mental status improved Narcan but remained confused.  He does have elevated CK consistent with rhabdomyolysis.  Patient will need admission for further treatment.  CRITICAL CARE Performed by: Arby Barrette   Total critical care time: 30 minutes  Critical care time was exclusive of separately billable procedures and treating other patients.  Critical care was necessary to treat or prevent imminent or life-threatening deterioration.  Critical care was time spent personally by me on the following activities: development of treatment plan with patient and/or surrogate as well as nursing, discussions with consultants, evaluation of patient's response to treatment, examination of patient, obtaining history from patient or surrogate, ordering and performing treatments and interventions, ordering and review of laboratory studies, ordering and review of radiographic studies, pulse oximetry and re-evaluation of patient's condition.    Arby Barrette, MD 04/23/21 2351    Arby Barrette, MD 04/23/21 2351

## 2021-04-23 NOTE — Sepsis Progress Note (Signed)
[facility] is following this sepsis 

## 2021-04-23 NOTE — ED Triage Notes (Signed)
911 call was for OD. Pt denies taking any meds. EMS reports AMS with GCS of 13. Wakes up to verbal command. Recent prescription of Oxycodone on Dec 9 with 90 days supply all gone per EMS. Also has a rash to abdomen and strong smell of urine in pt apartment. CBG 235 HR 105 BP 106/63 O2 92% on RA

## 2021-04-23 NOTE — Progress Notes (Signed)
Pharmacy Antibiotic Note  [name] [name] is a 65 y.o. male admitted on 04/23/2021 with sepsis.  Pharmacy has been consulted for vancomycin dosing. Patient with anaphylaxis/swelling reaction to penicillin in allergy list (entered in 2017). Patient received rocephin course at that time without documentation of reaction.  Patient with a history of seizure disorder, COPD, atrial fibrillation, CHF, diabetes, hepatitis C, hypertension, MI, bilateral AKA, on chronic anticoagulation. Patient presenting with AMS.  CXR w/o evidence of infiltrate suggesting infection  SCr 0.8 - at baseline WBC 18.2; LA 2.5; T 98.5 F  Plan: Given aztreonam in ED x 1 dose. Discussed allergy history with admitting MD. Will move forward with ceftriaxone for now. Ceftriaxone 2g q24hr Vancomycin 2000 mg once then 1500 mg q48hr (eAUC 467) unless change in renal function Trend WBC, Fever, Renal function, & Clinical course F/u cultures, clinical course, WBC, fever De-escalate when able Levels at steady state  Height: 4\' 1"  (124.5 cm) Weight: 102.1 kg (225 lb 1.4 oz) IBW/kg (Calculated) : 24.7  Temp (24hrs), Avg:98.5 F (36.9 C), Min:98.5 F (36.9 C), Max:98.5 F (36.9 C)  Recent Labs  Lab 04/23/21 1328 04/23/21 1510  WBC 18.2*  --   CREATININE 1.20 0.80  LATICACIDVEN 2.5*  --     Estimated Creatinine Clearance: 73.5 mL/min (by C-G formula based on SCr of 0.8 mg/dL).    Allergies  Allergen Reactions   Penicillins Anaphylaxis    Tolerated cefepime and ceftriaxone  Has patient had a PCN reaction causing immediate rash, facial/tongue/throat swelling, SOB or lightheadedness with hypotension: Yes Has patient had a PCN reaction causing severe rash involving mucus membranes or skin necrosis: No Has patient had a PCN reaction that required hospitalization No Has patient had a PCN reaction occurring within the last 10 years: No If all of the above answers are "NO", then may proceed with Cephalosporin use.    Penicillins Swelling    SWELLING REACTION UNSPECIFIED    Prednisone Anaphylaxis   Prednisone Swelling    THROAT   Acetaminophen Other (See Comments)    Affects liver   Tramadol Nausea Only and Other (See Comments)     Nausea and eyes were bloodshot red   Latex Itching    Pt. Stated  Latex was indicated during hypersensitivity panel   Tylenol [Acetaminophen]     Due to liver function per patient   Latex Rash   Other Rash    Plastic   Trazodone Other (See Comments)    Made eyes red     Antimicrobials this admission: Aztreonam x 1 in ED 12/18 Ceftriaxone 12/18 >>  vancomycin 12/18 >>  Microbiology results: Pending  Thank you for allowing pharmacy to be a part of this patients care.  1/19, PharmD, BCPS 04/23/2021 6:26 PM ED Clinical Pharmacist -  [phone]

## 2021-04-23 NOTE — ED Provider Notes (Cosign Needed)
Accepted handoff at shift change from [facility], PA-C. Please see prior provider note for more detail.   Briefly: Patient is 65 y.o. male with a past medical history of seizure disorder, COPD, atrial fibrillation, CHF, diabetes, hepatitis C, hypertension, MI, bilateral AKA, on chronic anticoagulation.  Per prior provider, patient initially presented to the emergency department with altered mental status.  History provided by EMS.  They were called out by a neighbor/friend who checked on the patient and was concern for possible overdose.  On scene, EMS found a bottle of oxycodone that was refilled on 12-7 with a 90 pills, that was empty.  Additionally, patient had pills scattered around the house on the floor, neighbor states patient has been picking them up randomly and taking them.  Neighbor reports patient has had frequent falls over the past few days.   Additional history taken chart reviewed.  Patient with a history of A. fib on Eliquis, unknown if he is taking it.  Additional history of bipolar, CHF, COPD, diabetes, depression, hypertension, CAD, seizures.  Per chart review and bottles in the house, patient is on digoxin, amiodarone, amitriptyline, atorvastatin, Eliquis, metformin, klonopin.    Plan: Patient with altered mental status will likely require admission.  We are still awaiting labs.   Physical Exam  BP 103/83    Pulse 92    Temp 98.5 F (36.9 C) (Oral)    Resp 17    Ht 4\' 1"  (1.245 m)    Wt 102.1 kg    SpO2 91%    BMI 65.91 kg/m   Physical Exam  ED Course/Procedures   Clinical Course as of 04/23/21 1849  Sun Apr 23, 2021  1531 AMS - came in alert only to person. Concern for OD on opiates. No suicide. Improved with narcan, but now has worsened. Pending Lactic, Urine, CK [GL]  1551 CK Total(!): 6,928 [GL]  1552 Lactic Acid, Venous(!!): 2.5 [GL]  1552 ALT(!): 56 [GL]  1552 AST(!): 146 [GL]  1552 Total Bilirubin(!): 1.5 [GL]  1848 Spoke with Dr. Apr 25, 2021 who will admit  patient from [facility] [GL]    Clinical Course User Index [GL] Derisha Funderburke, Gretel Acre, PA-C    Procedures  MDM  Labs are notable for leukocytosis to 18.2.  No kidney dysfunction.  He does have mild transaminitis with an AST to 146 and ALT to 56.  T bili is mildly elevated to 1.5. Lactic acid is significant for an elevation of 2.5.  CK is 6928.  VBG is unremarkable. Tox panel thus far has been unremarkable.  Digoxin level is subtherapeutic, this patient may not be taking his home digoxin.  Still awaiting UDS.  Chest x-ray with no evidence of infiltrate suggesting infection.  He did have some cardiomegaly and pulmonary vascular congestion evident on chest x-ray, however BNP is not elevated and patient appears clinically hypovolemic.  CT scans notable for bilateral nasal fracture.  No acute injury noted on CT head or cervical spine  Given increased transaminitis, will add on ammonia.  Still awaiting urinalysis and UDS.    Patient appears to be dehydrated with dry mucous membranes.  Abnormal labs could all be accounted for via severe dehydration and rhabdomyolysis.  There is no obvious source of infection at this time other than possible urine source.   1650: After discussion with my attending physician, Dr. Finis Bud, Plan to start fluid resuscitation with NS 30 cc/kg and NS 125 cc/hr administration for suspected rhabdomyolysis.   1800:  During time in  ED, patient has been borderline hypotensive. He has some blanchable erythematous areas on his torso and legs that may be representative to a developing cellulitis. We are also pending UA. With leukocytosis and lactic acid elevation, we will go ahead and start patient on broad spectrum antibiotics as well.   Plan to admit to [facility] for AMS secondary to rhabdomyolysis and possible sepsis with concern for uti v cellulitis.            Claudie Leach, PA-C 04/23/21 1851
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
## 2021-04-23 NOTE — ED Notes (Signed)
Called lab to sent dark green tube back to mini lab station 73 for I-stat.

## 2021-04-24 ENCOUNTER — Observation Stay (HOSPITAL_COMMUNITY): Payer: Medicare Other

## 2021-04-24 DIAGNOSIS — I480 Paroxysmal atrial fibrillation: Secondary | ICD-10-CM

## 2021-04-24 DIAGNOSIS — T887XXA Unspecified adverse effect of drug or medicament, initial encounter: Secondary | ICD-10-CM | POA: Diagnosis not present

## 2021-04-24 DIAGNOSIS — R4182 Altered mental status, unspecified: Secondary | ICD-10-CM | POA: Diagnosis not present

## 2021-04-24 DIAGNOSIS — T50904A Poisoning by unspecified drugs, medicaments and biological substances, undetermined, initial encounter: Secondary | ICD-10-CM | POA: Diagnosis not present

## 2021-04-24 DIAGNOSIS — Z743 Need for continuous supervision: Secondary | ICD-10-CM | POA: Diagnosis not present

## 2021-04-24 LAB — CK: Total CK: 4094 U/L — ABNORMAL HIGH (ref 49–397)

## 2021-04-24 LAB — BASIC METABOLIC PANEL
Anion gap: 8 (ref 5–15)
BUN: 18 mg/dL (ref 8–23)
CO2: 22 mmol/L (ref 22–32)
Calcium: 7.9 mg/dL — ABNORMAL LOW (ref 8.9–10.3)
Chloride: 108 mmol/L (ref 98–111)
Creatinine, Ser: 0.87 mg/dL (ref 0.61–1.24)
GFR, Estimated: >60 mL/min (ref >60)
Glucose, Bld: 136 mg/dL — ABNORMAL HIGH (ref 70–99)
Potassium: 3.6 mmol/L (ref 3.5–5.1)
Sodium: 138 mmol/L (ref 135–145)

## 2021-04-24 LAB — CBC
HCT: 36.3 % — ABNORMAL LOW (ref 39.0–52.0)
Hemoglobin: 11.7 g/dL — ABNORMAL LOW (ref 13.0–17.0)
MCH: 30.2 pg (ref 26.0–34.0)
MCHC: 32.2 g/dL (ref 30.0–36.0)
MCV: 93.6 fL (ref 80.0–100.0)
Platelets: 272 10*3/uL (ref 150–400)
RBC: 3.88 MIL/uL — ABNORMAL LOW (ref 4.22–5.81)
RDW: 13.6 % (ref 11.5–15.5)
WBC: 14 10*3/uL — ABNORMAL HIGH (ref 4.0–10.5)
nRBC: 0 % (ref 0.0–0.2)

## 2021-04-24 MED ORDER — LACTATED RINGERS IV SOLN: INTRAVENOUS | Status: DC

## 2021-04-24 NOTE — ED Notes (Signed)
This RN walked in to pt sitting in bed with monitor wires removed and new hand IV removed, pt placed back on monitor.

## 2021-04-24 NOTE — ED Notes (Signed)
This RN walked in to pt off of cardiac and pulse ox monitor, and IV beeping. R Hand IV infiltrated. This RN placed pt back on monitor and educated importance of monitoring him

## 2021-04-24 NOTE — ED Notes (Addendum)
Pt becoming agitated, pulling wires off, and stating "I'm going out for a smoke, I don't care what no one says". This RN tried to redirect pt and offering a possible nicotine patch per MD order. Pt states nicotine patches don't work.

## 2021-04-24 NOTE — ED Notes (Addendum)
This RN walked in to pt's R AC IV pulled out, pt stated "it just came out". RN educated pt on importance of his IV fluids. IV fluids going through R hand IV.

## 2021-04-24 NOTE — Progress Notes (Signed)
OT Cancellation Note  Patient Details Name: [name] [name] MRN: [id number] DOB: [birth year]   Cancelled Treatment:    Reason Eval/Treat Not Completed: Patient declined, no reason specified. Pt encountered at nursing station, calling family and speaking to MD. Planning to leave AMA and refusing OT eval. Currently waiting for family to pick him up. Will sign off. Reconsult if status changes.   Hillard Danker, OT/L  Acute Rehab [phone]  Lenice Llamas 04/24/2021, 8:43 AM

## 2021-04-24 NOTE — ED Notes (Signed)
Pt refused vitals. Pt insisted on being wheeled out to the lobby to wait for his ride and/or social work to set up transportation. MD has gone over risks of leaving. Pt insisted on leaving and waiting in the lobby.

## 2021-04-24 NOTE — ED Notes (Signed)
Upon rounding this AM pt was awake, alert and oriented. He is demanding to leave and wants to go home. Pt is in no acute distress and vitals are within normal limits. MD notified. MD contacted pt.'s brother who is going to try to convince the pt to stay. This RN has provided the patient a phone to contact his brother.

## 2021-04-24 NOTE — Discharge Summary (Signed)
° ° ° °                                                      AMA  Patient at this time expresses desire to leave the Hospital immidiately, patient has been warned that this is not Medically advisable at this time, and can result in Medical complications like Death and Disability, patient understands and accepts the risks involved and assumes full responsibilty of this decision.  Also talked to his brother Casimiro Needle and made patient talk to his brother over the phone as well, his brother understands that we cannot stop his brother against his wishes.  Patient proceeded to leave AMA.   Susa Raring M.D on 04/24/2021 at 10:28 AM  [facility]  Time < 30 minutes  Last Note Below

## 2021-04-26 LAB — URINE CULTURE: Culture: >=100000 — AB

## 2021-04-27 ENCOUNTER — Telehealth: Payer: Self-pay | Admitting: *Deleted

## 2021-04-27 NOTE — Progress Notes (Signed)
ED Antimicrobial Stewardship Positive Culture Follow Up   Kris No is an 65 y.o. male who presented to [facility] on 04/23/2021 with a chief complaint of  Chief Complaint  Patient presents with   Drug Overdose    Recent Results (from the past 720 hour(s))  Culture, blood (routine x 2)     Status: None (Preliminary result)   Collection Time: 04/23/21  1:57 PM   Specimen: BLOOD  Result Value Ref Range Status   Specimen Description BLOOD LEFT ANTECUBITAL  Final   Special Requests   Final    BOTTLES DRAWN AEROBIC AND ANAEROBIC Blood Culture results may not be optimal due to an inadequate volume of blood received in culture bottles   Culture   Final    NO GROWTH 4 DAYS Performed at [facility], [address] [place], [place] [address]    Report Status PENDING  Incomplete  Culture, blood (routine x 2)     Status: None (Preliminary result)   Collection Time: 04/23/21  2:00 PM   Specimen: BLOOD RIGHT WRIST  Result Value Ref Range Status   Specimen Description BLOOD RIGHT WRIST  Final   Special Requests   Final    BOTTLES DRAWN AEROBIC AND ANAEROBIC Blood Culture results may not be optimal due to an inadequate volume of blood received in culture bottles   Culture   Final    NO GROWTH 4 DAYS Performed at [facility], [address], [place], [place] [address]    Report Status PENDING  Incomplete  Resp Panel by RT-PCR (Flu A&B, Covid) Nasopharyngeal Swab     Status: None   Collection Time: 04/23/21  5:29 PM   Specimen: Nasopharyngeal Swab; Nasopharyngeal(NP) swabs in vial transport medium  Result Value Ref Range Status   SARS Coronavirus 2 by RT PCR NEGATIVE NEGATIVE Final    Comment: (NOTE) SARS-CoV-2 target nucleic acids are NOT DETECTED.  The SARS-CoV-2 RNA is generally detectable in upper respiratory specimens during the acute phase of infection. The lowest concentration of SARS-CoV-2 viral copies this assay can detect is 138 copies/mL. A negative result  does not preclude SARS-Cov-2 infection and should not be used as the sole basis for treatment or other patient management decisions. A negative result may occur with  improper specimen collection/handling, submission of specimen other than nasopharyngeal swab, presence of viral mutation(s) within the areas targeted by this assay, and inadequate number of viral copies(<138 copies/mL). A negative result must be combined with clinical observations, patient history, and epidemiological information. The expected result is Negative.  Fact Sheet for Patients:  BloggerCourse.com  Fact Sheet for Healthcare Providers:  SeriousBroker.it  This test is no t yet approved or cleared by the [place] FDA and  has been authorized for detection and/or diagnosis of SARS-CoV-2 by FDA under an Emergency Use Authorization (EUA). This EUA will remain  in effect (meaning this test can be used) for the duration of the COVID-19 declaration under Section 564(b)(1) of the Act, 21 U.S.C.section 360bbb-3(b)(1), unless the authorization is terminated  or revoked sooner.       Influenza A by PCR NEGATIVE NEGATIVE Final   Influenza B by PCR NEGATIVE NEGATIVE Final    Comment: (NOTE) The Xpert Xpress SARS-CoV-2/FLU/RSV plus assay is intended as an aid in the diagnosis of influenza from Nasopharyngeal swab specimens and should not be used as a sole basis for treatment. Nasal washings and aspirates are unacceptable for Xpert Xpress SARS-CoV-2/FLU/RSV testing.  Fact Sheet  for Patients: BloggerCourse.com  Fact Sheet for Healthcare Providers: SeriousBroker.it  This test is not yet approved or cleared by the [place] FDA and has been authorized for detection and/or diagnosis of SARS-CoV-2 by FDA under an Emergency Use Authorization (EUA). This EUA will remain in effect (meaning this test can be used) for  the duration of the COVID-19 declaration under Section 564(b)(1) of the Act, 21 U.S.C. section 360bbb-3(b)(1), unless the authorization is terminated or revoked.  Performed at [facility], [address], [place], [place] [address]   Urine Culture     Status: Abnormal   Collection Time: 04/23/21  6:20 PM   Specimen: Urine, Clean Catch  Result Value Ref Range Status   Specimen Description URINE, CLEAN CATCH  Final   Special Requests   Final    NONE Performed at [facility], [address], [place], [place] [address]    Culture >=100,000 COLONIES/mL PROTEUS MIRABILIS (A)  Final   Report Status 04/26/2021 FINAL  Final   Organism ID, Bacteria PROTEUS MIRABILIS (A)  Final      Susceptibility   Proteus mirabilis - MIC*    AMPICILLIN <=2 SENSITIVE Sensitive     CEFAZOLIN <=4 SENSITIVE Sensitive     CEFEPIME <=0.12 SENSITIVE Sensitive     CEFTRIAXONE <=0.25 SENSITIVE Sensitive     CIPROFLOXACIN >=4 RESISTANT Resistant     GENTAMICIN <=1 SENSITIVE Sensitive     IMIPENEM 2 SENSITIVE Sensitive     NITROFURANTOIN 128 RESISTANT Resistant     TRIMETH/SULFA 40 SENSITIVE Sensitive     AMPICILLIN/SULBACTAM <=2 SENSITIVE Sensitive     PIP/TAZO <=4 SENSITIVE Sensitive     * >=100,000 COLONIES/mL PROTEUS MIRABILIS    Left AMA, plan for admission with UTI tx  New antibiotic prescription: Keflex  ED Provider: Arthor Captain, PA-C   Daylene Posey 04/27/2021, 12:32 PM Clinical Pharmacist Monday - Friday phone -  [phone] Saturday - Sunday phone - [phone]

## 2021-04-27 NOTE — Telephone Encounter (Signed)
Post ED Visit - Positive Culture Follow-up: Successful Patient Follow-Up  Culture assessed and recommendations reviewed by:  []  , Pharm.D. []  Enzo Bi, Pharm.D., BCPS AQ-ID []  , Pharm.D., BCPS []  Celedonio Miyamoto, Pharm.D., BCPS []  Maxwell, Garvin Fila.D., BCPS, AAHIVP []  , Pharm.D., BCPS, AAHIVP []  Georgina Pillion, PharmD, BCPS []  , PharmD, BCPS []  [place], PharmD, BCPS []  [address], PharmD  Positive urine culture  [x]  Patient discharged without antimicrobial prescription and treatment is now indicated []  Organism is resistant to prescribed ED discharge antimicrobial []  Patient with positive blood cultures  Changes discussed with ED provider: , PA New antibiotic prescription Keflex 500mg  PO q6hrs x 7 days Called to [place], [facility] [phone]  Contacted patient, date 04/27/2021, time 1225pm   Phillips Climes 04/27/2021, 12:20 PM

## 2021-04-28 LAB — CULTURE, BLOOD (ROUTINE X 2)
Culture: NO GROWTH
Culture: NO GROWTH

## 2021-05-11 ENCOUNTER — Other Ambulatory Visit: Payer: Self-pay | Admitting: Family Medicine

## 2021-05-19 DIAGNOSIS — G8929 Other chronic pain: Secondary | ICD-10-CM | POA: Diagnosis not present

## 2021-05-19 DIAGNOSIS — S78119A Complete traumatic amputation at level between unspecified hip and knee, initial encounter: Secondary | ICD-10-CM | POA: Diagnosis not present

## 2021-05-19 DIAGNOSIS — M5416 Radiculopathy, lumbar region: Secondary | ICD-10-CM | POA: Diagnosis not present

## 2021-05-19 DIAGNOSIS — M25551 Pain in right hip: Secondary | ICD-10-CM | POA: Diagnosis not present

## 2021-05-19 DIAGNOSIS — Z79899 Other long term (current) drug therapy: Secondary | ICD-10-CM | POA: Diagnosis not present

## 2021-05-22 DIAGNOSIS — Z1211 Encounter for screening for malignant neoplasm of colon: Secondary | ICD-10-CM | POA: Diagnosis not present

## 2021-05-22 DIAGNOSIS — Z7901 Long term (current) use of anticoagulants: Secondary | ICD-10-CM | POA: Diagnosis not present

## 2021-05-22 DIAGNOSIS — B182 Chronic viral hepatitis C: Secondary | ICD-10-CM | POA: Diagnosis not present

## 2021-05-22 DIAGNOSIS — R945 Abnormal results of liver function studies: Secondary | ICD-10-CM | POA: Diagnosis not present

## 2021-05-23 DIAGNOSIS — Z79899 Other long term (current) drug therapy: Secondary | ICD-10-CM | POA: Diagnosis not present

## 2021-05-23 DIAGNOSIS — S72001A Fracture of unspecified part of neck of right femur, initial encounter for closed fracture: Secondary | ICD-10-CM | POA: Diagnosis not present

## 2021-05-23 DIAGNOSIS — Z89611 Acquired absence of right leg above knee: Secondary | ICD-10-CM | POA: Diagnosis not present

## 2021-05-23 DIAGNOSIS — E1151 Type 2 diabetes mellitus with diabetic peripheral angiopathy without gangrene: Secondary | ICD-10-CM | POA: Diagnosis not present

## 2021-06-05 DIAGNOSIS — B182 Chronic viral hepatitis C: Secondary | ICD-10-CM | POA: Diagnosis not present

## 2021-06-05 DIAGNOSIS — Z79899 Other long term (current) drug therapy: Secondary | ICD-10-CM | POA: Diagnosis not present

## 2021-06-05 DIAGNOSIS — Z6841 Body Mass Index (BMI) 40.0 and over, adult: Secondary | ICD-10-CM | POA: Diagnosis not present

## 2021-06-05 DIAGNOSIS — Z20822 Contact with and (suspected) exposure to covid-19: Secondary | ICD-10-CM | POA: Diagnosis not present

## 2021-06-05 DIAGNOSIS — I10 Essential (primary) hypertension: Secondary | ICD-10-CM | POA: Diagnosis not present

## 2021-06-05 DIAGNOSIS — E559 Vitamin D deficiency, unspecified: Secondary | ICD-10-CM | POA: Diagnosis not present

## 2021-06-05 DIAGNOSIS — I420 Dilated cardiomyopathy: Secondary | ICD-10-CM | POA: Diagnosis not present

## 2021-06-05 DIAGNOSIS — E119 Type 2 diabetes mellitus without complications: Secondary | ICD-10-CM | POA: Diagnosis not present

## 2021-06-05 DIAGNOSIS — E7800 Pure hypercholesterolemia, unspecified: Secondary | ICD-10-CM | POA: Diagnosis not present

## 2021-06-05 DIAGNOSIS — F17210 Nicotine dependence, cigarettes, uncomplicated: Secondary | ICD-10-CM | POA: Diagnosis not present

## 2021-06-08 ENCOUNTER — Other Ambulatory Visit: Payer: Self-pay | Admitting: Family Medicine

## 2021-06-09 DIAGNOSIS — Z20828 Contact with and (suspected) exposure to other viral communicable diseases: Secondary | ICD-10-CM | POA: Diagnosis not present

## 2021-06-26 ENCOUNTER — Telehealth: Payer: Self-pay | Admitting: Family Medicine

## 2021-06-26 DIAGNOSIS — Z20822 Contact with and (suspected) exposure to covid-19: Secondary | ICD-10-CM | POA: Diagnosis not present

## 2021-06-26 NOTE — Telephone Encounter (Signed)
I called patient to schedule AWV. Patient said he had notified the office to let them know he switched to another practice. He said he had to switch to get medication for Hep C. He was very happy with Dr.Wendling.  Please remove PCP.

## 2021-06-28 DIAGNOSIS — M5416 Radiculopathy, lumbar region: Secondary | ICD-10-CM | POA: Diagnosis not present

## 2021-06-28 DIAGNOSIS — G8929 Other chronic pain: Secondary | ICD-10-CM | POA: Diagnosis not present

## 2021-06-28 DIAGNOSIS — M25551 Pain in right hip: Secondary | ICD-10-CM | POA: Diagnosis not present

## 2021-06-28 DIAGNOSIS — Z6841 Body Mass Index (BMI) 40.0 and over, adult: Secondary | ICD-10-CM | POA: Diagnosis not present

## 2021-06-28 DIAGNOSIS — Z79899 Other long term (current) drug therapy: Secondary | ICD-10-CM | POA: Diagnosis not present

## 2021-06-28 DIAGNOSIS — F17210 Nicotine dependence, cigarettes, uncomplicated: Secondary | ICD-10-CM | POA: Diagnosis not present

## 2021-06-28 DIAGNOSIS — G894 Chronic pain syndrome: Secondary | ICD-10-CM | POA: Diagnosis not present

## 2021-07-03 DIAGNOSIS — Z79899 Other long term (current) drug therapy: Secondary | ICD-10-CM | POA: Diagnosis not present

## 2021-07-05 DIAGNOSIS — I739 Peripheral vascular disease, unspecified: Secondary | ICD-10-CM | POA: Diagnosis not present

## 2021-07-06 ENCOUNTER — Other Ambulatory Visit: Payer: Self-pay | Admitting: Family Medicine

## 2021-07-06 ENCOUNTER — Other Ambulatory Visit: Payer: Self-pay | Admitting: Cardiology

## 2021-07-06 DIAGNOSIS — E782 Mixed hyperlipidemia: Secondary | ICD-10-CM

## 2021-07-06 DIAGNOSIS — E1165 Type 2 diabetes mellitus with hyperglycemia: Secondary | ICD-10-CM

## 2021-07-28 ENCOUNTER — Other Ambulatory Visit: Payer: Self-pay | Admitting: Family Medicine

## 2021-07-28 DIAGNOSIS — E1165 Type 2 diabetes mellitus with hyperglycemia: Secondary | ICD-10-CM

## 2021-07-28 DIAGNOSIS — E782 Mixed hyperlipidemia: Secondary | ICD-10-CM

## 2021-08-03 ENCOUNTER — Other Ambulatory Visit: Payer: Self-pay | Admitting: Family Medicine

## 2021-08-03 DIAGNOSIS — E1165 Type 2 diabetes mellitus with hyperglycemia: Secondary | ICD-10-CM

## 2021-08-03 DIAGNOSIS — E782 Mixed hyperlipidemia: Secondary | ICD-10-CM

## 2021-08-26 ENCOUNTER — Other Ambulatory Visit: Payer: Self-pay | Admitting: Family Medicine

## 2021-08-26 ENCOUNTER — Other Ambulatory Visit: Payer: Self-pay | Admitting: Cardiology

## 2021-08-31 ENCOUNTER — Other Ambulatory Visit: Payer: Self-pay | Admitting: Family Medicine

## 2021-09-07 ENCOUNTER — Other Ambulatory Visit: Payer: Self-pay | Admitting: Family Medicine

## 2021-09-12 ENCOUNTER — Other Ambulatory Visit: Payer: Self-pay | Admitting: Cardiology

## 2021-10-09 ENCOUNTER — Other Ambulatory Visit: Payer: Self-pay | Admitting: Cardiology

## 2021-10-09 NOTE — Telephone Encounter (Signed)
Rx refill sent to pharmacy.  2nd Attempt, pt needs office visit
